# Patient Record
Sex: Female | Born: 1970 | Race: Black or African American | Hispanic: No | Marital: Single | Smoking: Never smoker
Health system: Southern US, Community
[De-identification: ages and names within clinical notes are randomized; demographics above are authoritative.]

## PROBLEM LIST (undated history)

## (undated) DIAGNOSIS — I10 Essential (primary) hypertension: Secondary | ICD-10-CM

## (undated) DIAGNOSIS — E119 Type 2 diabetes mellitus without complications: Secondary | ICD-10-CM

## (undated) DIAGNOSIS — D649 Anemia, unspecified: Secondary | ICD-10-CM

## (undated) DIAGNOSIS — R140 Abdominal distension (gaseous): Secondary | ICD-10-CM

## (undated) DIAGNOSIS — J45909 Unspecified asthma, uncomplicated: Secondary | ICD-10-CM

## (undated) DIAGNOSIS — L680 Hirsutism: Secondary | ICD-10-CM

## (undated) DIAGNOSIS — Z8616 Personal history of COVID-19: Secondary | ICD-10-CM

## (undated) DIAGNOSIS — E785 Hyperlipidemia, unspecified: Secondary | ICD-10-CM

## (undated) DIAGNOSIS — K219 Gastro-esophageal reflux disease without esophagitis: Secondary | ICD-10-CM

## (undated) DIAGNOSIS — E282 Polycystic ovarian syndrome: Secondary | ICD-10-CM

## (undated) HISTORY — DX: Hirsutism: L68.0

## (undated) HISTORY — DX: Hyperlipidemia, unspecified: E78.5

## (undated) HISTORY — DX: Morbid (severe) obesity due to excess calories: E66.01

## (undated) HISTORY — DX: Abdominal distension (gaseous): R14.0

## (undated) HISTORY — DX: Polycystic ovarian syndrome: E28.2

## (undated) HISTORY — DX: Essential (primary) hypertension: I10

---

## 1999-08-11 ENCOUNTER — Other Ambulatory Visit: Admission: RE | Admit: 1999-08-11 | Discharge: 1999-08-11 | Payer: Self-pay | Admitting: Obstetrics & Gynecology

## 2000-10-17 ENCOUNTER — Other Ambulatory Visit: Admission: RE | Admit: 2000-10-17 | Discharge: 2000-10-17 | Payer: Self-pay | Admitting: Obstetrics and Gynecology

## 2002-05-12 ENCOUNTER — Other Ambulatory Visit: Admission: RE | Admit: 2002-05-12 | Discharge: 2002-05-12 | Payer: Self-pay | Admitting: Obstetrics and Gynecology

## 2004-02-07 ENCOUNTER — Other Ambulatory Visit: Admission: RE | Admit: 2004-02-07 | Discharge: 2004-02-07 | Payer: Self-pay | Admitting: Obstetrics and Gynecology

## 2004-04-27 ENCOUNTER — Ambulatory Visit: Payer: Self-pay | Admitting: Internal Medicine

## 2004-05-05 ENCOUNTER — Ambulatory Visit: Payer: Self-pay | Admitting: Family Medicine

## 2004-05-18 ENCOUNTER — Ambulatory Visit: Payer: Self-pay | Admitting: Family Medicine

## 2004-05-26 ENCOUNTER — Ambulatory Visit: Payer: Self-pay | Admitting: Family Medicine

## 2004-06-02 ENCOUNTER — Ambulatory Visit: Payer: Self-pay | Admitting: Family Medicine

## 2004-06-09 ENCOUNTER — Ambulatory Visit: Payer: Self-pay | Admitting: Family Medicine

## 2004-06-16 ENCOUNTER — Ambulatory Visit: Payer: Self-pay | Admitting: Family Medicine

## 2004-06-23 ENCOUNTER — Ambulatory Visit: Payer: Self-pay | Admitting: Family Medicine

## 2004-07-06 ENCOUNTER — Ambulatory Visit: Payer: Self-pay | Admitting: Family Medicine

## 2004-08-11 ENCOUNTER — Ambulatory Visit: Payer: Self-pay | Admitting: Family Medicine

## 2004-09-06 ENCOUNTER — Ambulatory Visit: Payer: Self-pay | Admitting: Family Medicine

## 2004-10-06 ENCOUNTER — Ambulatory Visit: Payer: Self-pay | Admitting: Internal Medicine

## 2005-03-09 ENCOUNTER — Ambulatory Visit: Payer: Self-pay | Admitting: Family Medicine

## 2005-03-09 ENCOUNTER — Encounter: Payer: Self-pay | Admitting: Family Medicine

## 2005-03-09 ENCOUNTER — Other Ambulatory Visit: Admission: RE | Admit: 2005-03-09 | Discharge: 2005-03-09 | Payer: Self-pay | Admitting: Family Medicine

## 2005-03-23 ENCOUNTER — Ambulatory Visit: Payer: Self-pay | Admitting: Family Medicine

## 2005-04-06 ENCOUNTER — Ambulatory Visit: Payer: Self-pay | Admitting: Family Medicine

## 2005-04-20 ENCOUNTER — Ambulatory Visit: Payer: Self-pay | Admitting: Family Medicine

## 2005-05-18 ENCOUNTER — Ambulatory Visit: Payer: Self-pay | Admitting: Family Medicine

## 2005-06-25 ENCOUNTER — Ambulatory Visit: Payer: Self-pay | Admitting: Family Medicine

## 2005-12-03 ENCOUNTER — Inpatient Hospital Stay (HOSPITAL_COMMUNITY): Admission: AD | Admit: 2005-12-03 | Discharge: 2005-12-03 | Payer: Self-pay | Admitting: Obstetrics and Gynecology

## 2007-07-03 ENCOUNTER — Encounter: Payer: Self-pay | Admitting: Family Medicine

## 2007-07-18 LAB — CONVERTED CEMR LAB: Pap Smear: ABNORMAL

## 2007-09-22 ENCOUNTER — Encounter: Payer: Self-pay | Admitting: Family Medicine

## 2008-06-25 ENCOUNTER — Encounter (INDEPENDENT_AMBULATORY_CARE_PROVIDER_SITE_OTHER): Payer: Self-pay | Admitting: *Deleted

## 2008-06-25 ENCOUNTER — Ambulatory Visit: Payer: Self-pay | Admitting: Family Medicine

## 2008-06-25 DIAGNOSIS — R142 Eructation: Secondary | ICD-10-CM

## 2008-06-25 DIAGNOSIS — L680 Hirsutism: Secondary | ICD-10-CM | POA: Insufficient documentation

## 2008-06-25 DIAGNOSIS — R141 Gas pain: Secondary | ICD-10-CM | POA: Insufficient documentation

## 2008-06-25 DIAGNOSIS — R87619 Unspecified abnormal cytological findings in specimens from cervix uteri: Secondary | ICD-10-CM | POA: Insufficient documentation

## 2008-06-25 DIAGNOSIS — R143 Flatulence: Secondary | ICD-10-CM

## 2008-06-25 LAB — CONVERTED CEMR LAB
Bilirubin Urine: NEGATIVE
Glucose, Urine, Semiquant: NEGATIVE
Ketones, urine, test strip: NEGATIVE
Nitrite: NEGATIVE
Protein, U semiquant: NEGATIVE
Specific Gravity, Urine: <1.005
Urobilinogen, UA: 0.2
WBC Urine, dipstick: NEGATIVE
pH: 7.5

## 2008-06-30 ENCOUNTER — Encounter (INDEPENDENT_AMBULATORY_CARE_PROVIDER_SITE_OTHER): Payer: Self-pay | Admitting: *Deleted

## 2008-06-30 ENCOUNTER — Ambulatory Visit: Payer: Self-pay | Admitting: Family Medicine

## 2008-07-07 ENCOUNTER — Telehealth (INDEPENDENT_AMBULATORY_CARE_PROVIDER_SITE_OTHER): Payer: Self-pay | Admitting: *Deleted

## 2008-07-07 LAB — CONVERTED CEMR LAB
ALT: 46 units/L — ABNORMAL HIGH (ref 0–35)
AST: 39 units/L — ABNORMAL HIGH (ref 0–37)
Albumin: 4.4 g/dL (ref 3.5–5.2)
Alkaline Phosphatase: 68 units/L (ref 39–117)
BUN: 7 mg/dL (ref 6–23)
Basophils Absolute: 0 10*3/uL (ref 0.0–0.1)
Basophils Relative: 0.3 % (ref 0.0–3.0)
Bilirubin, Direct: 0.1 mg/dL (ref 0.0–0.3)
CO2: 30 meq/L (ref 19–32)
Calcium: 9.8 mg/dL (ref 8.4–10.5)
Chloride: 103 meq/L (ref 96–112)
Cholesterol: 232 mg/dL (ref 0–200)
Cortisol, Plasma: 12.5 ug/dL
Creatinine, Ser: 0.7 mg/dL (ref 0.4–1.2)
Direct LDL: 149.1 mg/dL
Eosinophils Absolute: 0.1 10*3/uL (ref 0.0–0.7)
Eosinophils Relative: 1.4 % (ref 0.0–5.0)
FSH: 4.8 milliintl units/mL
GFR calc Af Amer: 122 mL/min
GFR calc non Af Amer: 101 mL/min
Glucose, Bld: 114 mg/dL — ABNORMAL HIGH (ref 70–99)
HCT: 39 % (ref 36.0–46.0)
HDL: 52.1 mg/dL (ref >39.0)
Hemoglobin: 13.5 g/dL (ref 12.0–15.0)
LH: 6.2 milliintl units/mL
Lymphocytes Relative: 21.3 % (ref 12.0–46.0)
MCHC: 34.6 g/dL (ref 30.0–36.0)
MCV: 81.8 fL (ref 78.0–100.0)
Monocytes Absolute: 0.4 10*3/uL (ref 0.1–1.0)
Monocytes Relative: 5.7 % (ref 3.0–12.0)
Neutro Abs: 5.6 10*3/uL (ref 1.4–7.7)
Neutrophils Relative %: 71.3 % (ref 43.0–77.0)
Platelets: 327 10*3/uL (ref 150–400)
Potassium: 3.9 meq/L (ref 3.5–5.1)
RBC: 4.76 M/uL (ref 3.87–5.11)
RDW: 14.4 % (ref 11.5–14.6)
Sodium: 139 meq/L (ref 135–145)
TSH: 0.89 microintl units/mL (ref 0.35–5.50)
Total Bilirubin: 1.1 mg/dL (ref 0.3–1.2)
Total CHOL/HDL Ratio: 4.5
Total Protein: 9 g/dL — ABNORMAL HIGH (ref 6.0–8.3)
Triglycerides: 181 mg/dL — ABNORMAL HIGH (ref 0–149)
VLDL: 36 mg/dL (ref 0–40)
WBC: 7.8 10*3/uL (ref 4.5–10.5)
hCG, Beta Chain, Quant, S: <0.5 milliintl units/mL

## 2008-07-08 LAB — CONVERTED CEMR LAB: Insulin: 69 microintl units/mL — ABNORMAL HIGH (ref 3–28)

## 2008-07-09 ENCOUNTER — Encounter (INDEPENDENT_AMBULATORY_CARE_PROVIDER_SITE_OTHER): Payer: Self-pay | Admitting: *Deleted

## 2008-07-14 ENCOUNTER — Ambulatory Visit: Payer: Self-pay | Admitting: Family Medicine

## 2008-07-14 DIAGNOSIS — Z862 Personal history of diseases of the blood and blood-forming organs and certain disorders involving the immune mechanism: Secondary | ICD-10-CM | POA: Insufficient documentation

## 2008-07-14 DIAGNOSIS — E282 Polycystic ovarian syndrome: Secondary | ICD-10-CM | POA: Insufficient documentation

## 2008-07-14 DIAGNOSIS — I10 Essential (primary) hypertension: Secondary | ICD-10-CM | POA: Insufficient documentation

## 2008-07-14 DIAGNOSIS — E785 Hyperlipidemia, unspecified: Secondary | ICD-10-CM | POA: Insufficient documentation

## 2008-07-14 DIAGNOSIS — Z8639 Personal history of other endocrine, nutritional and metabolic disease: Secondary | ICD-10-CM | POA: Insufficient documentation

## 2008-08-04 ENCOUNTER — Telehealth (INDEPENDENT_AMBULATORY_CARE_PROVIDER_SITE_OTHER): Payer: Self-pay | Admitting: *Deleted

## 2008-08-04 ENCOUNTER — Ambulatory Visit: Payer: Self-pay | Admitting: Family Medicine

## 2008-08-09 LAB — CONVERTED CEMR LAB
HCV Ab: NEGATIVE
Hep A IgM: NEGATIVE
Hep B C IgM: NEGATIVE
Hepatitis B Surface Ag: NEGATIVE

## 2008-08-10 ENCOUNTER — Encounter (INDEPENDENT_AMBULATORY_CARE_PROVIDER_SITE_OTHER): Payer: Self-pay | Admitting: *Deleted

## 2008-08-17 LAB — CONVERTED CEMR LAB
ALT: 53 units/L — ABNORMAL HIGH (ref 0–35)
AST: 55 units/L — ABNORMAL HIGH (ref 0–37)
Albumin: 4.3 g/dL (ref 3.5–5.2)
Alkaline Phosphatase: 70 units/L (ref 39–117)
Bilirubin, Direct: 0.1 mg/dL (ref 0.0–0.3)
GGT: 40 units/L (ref 7–51)
Total Bilirubin: 1.6 mg/dL — ABNORMAL HIGH (ref 0.3–1.2)
Total Protein: 9 g/dL — ABNORMAL HIGH (ref 6.0–8.3)

## 2008-08-18 ENCOUNTER — Telehealth (INDEPENDENT_AMBULATORY_CARE_PROVIDER_SITE_OTHER): Payer: Self-pay | Admitting: *Deleted

## 2008-08-30 ENCOUNTER — Telehealth (INDEPENDENT_AMBULATORY_CARE_PROVIDER_SITE_OTHER): Payer: Self-pay | Admitting: *Deleted

## 2008-09-02 ENCOUNTER — Ambulatory Visit: Payer: Self-pay | Admitting: Family Medicine

## 2008-09-03 ENCOUNTER — Encounter (INDEPENDENT_AMBULATORY_CARE_PROVIDER_SITE_OTHER): Payer: Self-pay | Admitting: *Deleted

## 2008-09-03 LAB — CONVERTED CEMR LAB
ALT: 34 units/L (ref 0–35)
AST: 31 units/L (ref 0–37)
Albumin: 4 g/dL (ref 3.5–5.2)
Alkaline Phosphatase: 63 units/L (ref 39–117)
Bilirubin, Direct: 0 mg/dL (ref 0.0–0.3)
Total Bilirubin: 1.1 mg/dL (ref 0.3–1.2)
Total Protein: 8.2 g/dL (ref 6.0–8.3)

## 2008-09-28 LAB — HM MAMMOGRAPHY

## 2008-11-02 ENCOUNTER — Encounter: Payer: Self-pay | Admitting: Family Medicine

## 2009-05-02 ENCOUNTER — Encounter: Payer: Self-pay | Admitting: Family Medicine

## 2010-02-27 LAB — CONVERTED CEMR LAB: Pap Smear: NORMAL

## 2010-02-27 LAB — HM PAP SMEAR: HM Pap smear: NORMAL

## 2010-04-04 ENCOUNTER — Encounter: Payer: Self-pay | Admitting: Family Medicine

## 2010-04-04 ENCOUNTER — Ambulatory Visit: Payer: Self-pay | Admitting: Family Medicine

## 2010-04-05 ENCOUNTER — Encounter: Payer: Self-pay | Admitting: Family Medicine

## 2010-04-10 LAB — CONVERTED CEMR LAB
ALT: 29 units/L (ref 0–35)
AST: 30 units/L (ref 0–37)
Albumin: 4.4 g/dL (ref 3.5–5.2)
Alkaline Phosphatase: 58 units/L (ref 39–117)
BUN: 9 mg/dL (ref 6–23)
Basophils Absolute: 0 10*3/uL (ref 0.0–0.1)
Basophils Relative: 0.3 % (ref 0.0–3.0)
Bilirubin, Direct: 0.1 mg/dL (ref 0.0–0.3)
CO2: 32 meq/L (ref 19–32)
Calcium: 10.2 mg/dL (ref 8.4–10.5)
Chloride: 101 meq/L (ref 96–112)
Cholesterol: 237 mg/dL — ABNORMAL HIGH (ref 0–200)
Creatinine, Ser: 0.7 mg/dL (ref 0.4–1.2)
Direct LDL: 159.1 mg/dL
Eosinophils Absolute: 0.3 10*3/uL (ref 0.0–0.7)
Eosinophils Relative: 3.4 % (ref 0.0–5.0)
GFR calc non Af Amer: 116.59 mL/min (ref >60)
Glucose, Bld: 103 mg/dL — ABNORMAL HIGH (ref 70–99)
HCT: 39 % (ref 36.0–46.0)
HDL: 55.5 mg/dL (ref >39.00)
Hemoglobin: 12.7 g/dL (ref 12.0–15.0)
Lymphocytes Relative: 28 % (ref 12.0–46.0)
Lymphs Abs: 2.3 10*3/uL (ref 0.7–4.0)
MCHC: 32.7 g/dL (ref 30.0–36.0)
MCV: 82.1 fL (ref 78.0–100.0)
Monocytes Absolute: 0.5 10*3/uL (ref 0.1–1.0)
Monocytes Relative: 6.1 % (ref 3.0–12.0)
Neutro Abs: 5.1 10*3/uL (ref 1.4–7.7)
Neutrophils Relative %: 62.2 % (ref 43.0–77.0)
Platelets: 358 10*3/uL (ref 150.0–400.0)
Potassium: 4.5 meq/L (ref 3.5–5.1)
RBC: 4.75 M/uL (ref 3.87–5.11)
RDW: 15 % — ABNORMAL HIGH (ref 11.5–14.6)
Sodium: 140 meq/L (ref 135–145)
TSH: 1.27 microintl units/mL (ref 0.35–5.50)
Total Bilirubin: 0.7 mg/dL (ref 0.3–1.2)
Total CHOL/HDL Ratio: 4
Total Protein: 8.3 g/dL (ref 6.0–8.3)
Triglycerides: 178 mg/dL — ABNORMAL HIGH (ref 0.0–149.0)
VLDL: 35.6 mg/dL (ref 0.0–40.0)
WBC: 8.1 10*3/uL (ref 4.5–10.5)

## 2010-05-31 HISTORY — PX: OTHER SURGICAL HISTORY: SHX169

## 2010-06-26 ENCOUNTER — Ambulatory Visit
Admission: RE | Admit: 2010-06-26 | Discharge: 2010-06-26 | Payer: Self-pay | Source: Home / Self Care | Attending: Family Medicine | Admitting: Family Medicine

## 2010-07-03 ENCOUNTER — Ambulatory Visit: Admit: 2010-07-03 | Payer: Self-pay | Admitting: Family Medicine

## 2010-07-09 ENCOUNTER — Encounter: Payer: Self-pay | Admitting: Obstetrics and Gynecology

## 2010-07-20 NOTE — Letter (Signed)
Summary: Results Follow-up Letter  [place] at [facility]  [address] [place], [place] [address]   Phone: [phone]  Fax: [phone]    08/10/2008        Michele Tran [address] [place], [place]  [address]  Dear Ms. Sanagustin,   The following are the results of your recent test(s):  Test     Result     Pap Smear    Normal_______  Not Normal_____       Comments: _________________________________________________________ Cholesterol LDL(Bad cholesterol):          Your goal is less than:         HDL (Good cholesterol):        Your goal is more than: _________________________________________________________ Other Tests:   _________________________________________________________  Please call for an appointment Or _Please see attached labwork.________________________________________________________ _________________________________________________________ _________________________________________________________  Sincerely,  Ardyth Man [place] at [facility]

## 2010-07-20 NOTE — Progress Notes (Signed)
Summary: clarification on Lovaza rx  Phone Note From Pharmacy   Caller: [facility] [address]/[place] [phone] Summary of Call: questions re; lovaza written for qty 30, that would only last pt 7 1/2 days did you  mean 120? for 30 day supply rx was sent electronically advise please .Kandice Hams  August 04, 2008 9:49 AM  Initial call taken by: Kandice Hams,  August 04, 2008 9:49 AM      Prescriptions: LOVAZA 1 GM CAPS (OMEGA-3-ACID ETHYL ESTERS) take 2  tabs by mouth two times a day  #120 x 2   Entered by:   Ardyth Man   Authorized by:   Loreen Freud DO   Signed by:   Ardyth Man on 08/04/2008   Method used:   Electronically to        [facility]. [phone]* (retail)       [address]       [place], [place]  [address]       Ph: [phone]       Fax: [phone]   RxID:   [id number]

## 2010-07-20 NOTE — Medication Information (Signed)
Summary: Possible Noncompliance with Lisinopril/[employer]  Possible Noncompliance with Lisinopril/[employer]   Imported By: Lanelle Bal 11/04/2008 12:25:11  _____________________________________________________________________  External Attachment:    Type:   Image     Comment:   External Document

## 2010-07-20 NOTE — Progress Notes (Signed)
Summary: [facility] 08/30/08  Phone Note Call from Patient Call back at Work Phone [phone]   Caller: Patient Summary of Call: patient wants to know if she can take liver dtx cleanse with the meds she is taking   Initial call taken by: Okey Regal [place],  August 30, 2008 9:52 AM  Follow-up for Phone Call        Does she mean colon cleanse?  That's fine---take probiotic to replace good bacteria. Follow-up by: Loreen Freud DO,  August 30, 2008 12:31 PM  Additional Follow-up for Phone Call Additional follow up Details #1::        Left message for patient to call office Ardyth Man  August 30, 2008 2:31 PM  Additional Follow-up by: Ardyth Man,  August 30, 2008 2:31 PM    Additional Follow-up for Phone Call Additional follow up Details #2::    Patient aware Ardyth Man  August 31, 2008 1:02 PM  Follow-up by: Ardyth Man,  August 31, 2008 1:02 PM

## 2010-07-20 NOTE — Assessment & Plan Note (Signed)
Summary: bp ched, & lab liver function.cbs   Vital Signs:  Patient Profile:   40 Years Old Female Height:     66.75 inches Weight:      274.19 pounds Temp:     98.3 degrees F oral Pulse rate:   74 / minute Resp:     16 per minute BP sitting:   128 / 74  (right arm)  Pt. in pain?   no  Vitals Entered By: Ardyth Man (August 04, 2008 9:17 AM)                  PCP:  Laury Axon  Chief Complaint:  BP check and liver function test.  History of Present Illness:  Hypertension Follow-Up      This is a 40 year old woman who presents for Hypertension follow-up.  The patient denies lightheadedness, urinary frequency, headaches, edema, impotence, rash, and fatigue.  The patient denies the following associated symptoms: chest pain, chest pressure, exercise intolerance, dyspnea, palpitations, syncope, leg edema, and pedal edema.  Compliance with medications (by patient report) has been near 100%.  The patient reports that dietary compliance has been good.  The patient reports exercising daily.  Adjunctive measures currently used by the patient include salt restriction.      Current Allergies: ! CODEINE  Past Medical History:    Reviewed history from 07/14/2008 and no changes required:       Current Problems:        POLYCYSTIC OVARIES (ICD-256.4)       HYPERTENSION (ICD-401.9)       HYPERLIPIDEMIA (ICD-272.4)       HIRSUTISM (ICD-704.1)       PAP SMEAR, ABNORMAL (ICD-795.00)       ABDOMINAL BLOATING (ICD-787.3)       PREVENTIVE HEALTH CARE (ICD-V70.0)       FAMILY HISTORY DIABETES 1ST DEGREE RELATIVE (ICD-V18.0)                     Hyperlipidemia       Hypertension  Past Surgical History:    Reviewed history from 06/25/2008 and no changes required:       Denies surgical history      Physical Exam  General:     alert and overweight-appearing.   Lungs:     Normal respiratory effort, chest expands symmetrically. Lungs are clear to auscultation, no crackles or  wheezes. Heart:     normal rate, regular rhythm, and no murmur.   Extremities:     trace left pedal edema and trace right pedal edema.   Skin:     Intact without suspicious lesions or rashes Psych:     Cognition and judgment appear intact. Alert and cooperative with normal attention span and concentration. No apparent delusions, illusions, hallucinations    Impression & Recommendations:  Problem # 1:  HYPERTENSION (ICD-401.9)  Her updated medication list for this problem includes:    Zestoretic 10-12.5 Mg Tabs (Lisinopril-hydrochlorothiazide) .Marland Kitchen... 1 by mouth once daily  BP today: 128/74 Prior BP: 140/100 (07/14/2008)  Labs Reviewed: Creat: 0.7 (06/25/2008) Chol: 232 (06/25/2008)   HDL: 52.1 (06/25/2008)   LDL: 149.1 (06/25/2008)   TG: 181 (06/25/2008)   Problem # 2:  LIVER FUNCTION TESTS, ABNORMAL, HX OF (ICD-V12.2)  Orders: Venipuncture ([address]) TLB-Hepatic/Liver Function Pnl (80076-HEPATIC) T-Hepatitis Acute Panel ([address]8119-14782) T- * Misc. Laboratory test [phone])   Complete Medication List: 1)  Zestoretic 10-12.5 Mg Tabs (Lisinopril-hydrochlorothiazide) .Marland Kitchen.. 1 by mouth once daily 2)  Lovaza 1  Gm Caps (Omega-3-acid ethyl esters) .... Take 2  tabs by mouth two times a day 3)  Prometrium 100 Mg Caps (Progesterone micronized) .... Take one capsule daily for 10 days monthly  Appended Document: bp ched, & lab liver function.cbs

## 2010-07-20 NOTE — Assessment & Plan Note (Signed)
Summary: RETURN VISIT 3 WEEKS.CBS   Vital Signs:  Patient Profile:   40 Years Old Female Height:     66.75 inches Weight:      278.2 pounds BMI:     44.06 Temp:     98.5 degrees F oral Pulse rate:   76 / minute BP sitting:   140 / 100  (left arm)  Pt. in pain?   no  Vitals Entered By: Jeremy Johann CMA (July 14, 2008 9:33 AM)                  PCP:  Laury Axon  Chief Complaint:  bp check and labs.  History of Present Illness:  Hypertension Follow-Up      This is a 40 year old woman who presents for Hypertension follow-up.  The patient denies lightheadedness, urinary frequency, headaches, edema, impotence, rash, and fatigue.  The patient denies the following associated symptoms: chest pain, chest pressure, exercise intolerance, dyspnea, palpitations, syncope, leg edema, and pedal edema.  Compliance with medications (by patient report) has been near 100%.  The patient reports that dietary compliance has been good.  Adjunctive measures currently used by the patient include salt restriction.      Current Allergies (reviewed today): ! CODEINE  Past Medical History:    Current Problems:     POLYCYSTIC OVARIES (ICD-256.4)    HYPERTENSION (ICD-401.9)    HYPERLIPIDEMIA (ICD-272.4)    HIRSUTISM (ICD-704.1)    PAP SMEAR, ABNORMAL (ICD-795.00)    ABDOMINAL BLOATING (ICD-787.3)    PREVENTIVE HEALTH CARE (ICD-V70.0)    FAMILY HISTORY DIABETES 1ST DEGREE RELATIVE (ICD-V18.0)            Hyperlipidemia    Hypertension   Family History:    Reviewed history from 06/25/2008 and no changes required:       Family History of Arthritis       Family History Diabetes 1st degree relative       Family History High cholesterol       Family History Hypertension       Paunt--breast and lung ca  Social History:    Reviewed history from 06/25/2008 and no changes required:       Occupation: [employer]       Single       Never Smoked       Alcohol use-no       Drug use-no  Regular exercise-yes   Risk Factors: Tobacco use:  never Drug use:  no Caffeine use:  0 drinks per day Alcohol use:  no Exercise:  yes    Times per week:  5    Type:  walking Seatbelt use:  100 %  Family History Risk Factors:    Family History of MI in females < 88 years old:  no    Family History of MI in males < 34 years old:  no  PAP Smear History:    Date of Last PAP Smear:  07/18/2007   Review of Systems      See HPI   Physical Exam  General:     alert and overweight-appearing.   Lungs:     Normal respiratory effort, chest expands symmetrically. Lungs are clear to auscultation, no crackles or wheezes. Heart:     normal rate, regular rhythm, and no murmur.   Extremities:     No clubbing, cyanosis, edema, or deformity noted with normal full range of motion of all joints.      Impression & Recommendations:  Problem # 1:  HYPERTENSION (ICD-401.9) rto 2-3 weeks  Her updated medication list for this problem includes:    Zestoretic 10-12.5 Mg Tabs (Lisinopril-hydrochlorothiazide) .Marland Kitchen... 1 by mouth once daily  BP today: 140/100 Prior BP: 130/90 (06/25/2008)  Labs Reviewed: Creat: 0.7 (06/25/2008) Chol: 232 (06/25/2008)   HDL: 52.1 (06/25/2008)   LDL: 149.1 (06/25/2008)   TG: 181 (06/25/2008)   Problem # 2:  HYPERLIPIDEMIA (ICD-272.4)  Her updated medication list for this problem includes:    Lovaza 1 Gm Caps (Omega-3-acid ethyl esters) .Marland Kitchen... Take 2  tabs by mouth two times a day  Labs Reviewed: Chol: 232 (06/25/2008)   HDL: 52.1 (06/25/2008)   LDL: 149.1 (06/25/2008)   TG: 181 (06/25/2008) SGOT: 39 (06/25/2008)   SGPT: 46 (06/25/2008)   Problem # 3:  LIVER FUNCTION TESTS, ABNORMAL, HX OF (ICD-V12.2) recheck 2-3 weeks discussed labs with pt  Complete Medication List: 1)  Zestoretic 10-12.5 Mg Tabs (Lisinopril-hydrochlorothiazide) .Marland Kitchen.. 1 by mouth once daily 2)  Lovaza 1 Gm Caps (Omega-3-acid ethyl esters) .... Take 2  tabs by mouth two times a  day    Prescriptions: ZESTORETIC 10-12.5 MG TABS (LISINOPRIL-HYDROCHLOROTHIAZIDE) 1 by mouth once daily  #30 x 1   Entered and Authorized by:   Loreen Freud DO   Signed by:   Loreen Freud DO on 07/14/2008   Method used[facility]   [facility]       [address]       [place], [place]  [id number]       Ph: [phone] or [phone]       Fax: [phone]   RxID:   [id number] LOVAZA 1 GM CAPS (OMEGA-3-ACID ETHYL ESTERS) take 2  tabs by mouth two times a day  #30 x 2   Entered by:   Jeremy Johann CMA   Authorized by:   Loreen Freud DO   Signed by:   Jeremy Johann CMA on 07/14/2008   Method used:   [facility]       [address]       [place], [place]  [id number]       Ph: [phone] or [phone]       Fax: [phone]   RxID:   [id number] LOVAZA 1 GM CAPS (OMEGA-3-ACID ETHYL ESTERS) take 2  tabs by mouth two times a day  #30 x 2   Entered by:   Jeremy Johann CMA   Authorized by:   Loreen Freud DO   Signed by:   Jeremy Johann CMA on 07/14/2008   Method used:   Historical   RxID:   [id number]

## 2010-07-20 NOTE — Letter (Signed)
Summary: Results Follow-up Letter  [place] at [facility]  [address] [place], [place] [address]   Phone: [phone]  Fax: [phone]    06/30/2008         Michele Tran [address] [place], [place]  [address]  Dear Ms. Derner,   The following are the results of your recent test(s):  Test     Result     Pap Smear    Normal_______  Not Normal_____       Comments: _________________________________________________________ Cholesterol LDL(Bad cholesterol):          Your goal is less than:         HDL (Good cholesterol):        Your goal is more than: _________________________________________________________ Other Tests:   _________________________________________________________  Please call for an appointment Or Please see attached urine culture.________________________________________________________ _________________________________________________________ _________________________________________________________  Sincerely,  Felecia Deloach CMA [place] [facility]

## 2010-07-20 NOTE — Progress Notes (Signed)
Summary: lmtc 07-07-08, 07-12-08  Phone Note Outgoing Call   Details for Reason: lab results Summary of Call: left message to call  office.Felecia Deloach CMA  July 07, 2008 1:16 PM  Ideally your LDL (bad cholesterol) should be <_100___, your HDL (good cholesterol) should be >_50__ and your triglycerides should be< 150.  Diet and exercise will increase HDL and decrease the LDL and triglycerides. Read Dr. Danice Goltz book--Eat Drink and Be Healthy.    Start Lovaza 1g  2 by mouth two times a day,  2 refills.  We will recheck labs in __3_ months.  hep, lipid 272.4  Liver function is elevated---any ETOH, tylenol or other otc meds?  recheck 2 weeks--   Follow-up for Phone Call        BEFORE  4PM--[phone]--Returning Felicia call from Fri. Follow-up by: Freddy Jaksch,  July 12, 2008 10:04 AM  Additional Follow-up for Phone Call Additional follow up Details #1::        left message to call  office.Felecia Deloach CMA  July 12, 2008 4:28 PM    Additional Follow-up for Phone Call Additional follow up Details #2::    pt coming in today will notify her at appt.Felecia Deloach CMA  July 14, 2008 9:21 AM  New/Updated Medications: LOVAZA 1 GM CAPS (OMEGA-3-ACID ETHYL ESTERS) take 2  tabs by mouth two times a day

## 2010-07-20 NOTE — Letter (Signed)
Summary: Results Follow-up Letter  [place] at [facility]  [address] [place], [place] [address]   Phone: [phone]  Fax: [phone]    07/09/2008        Michele Tran [address] [place], [place]  [address]  Dear Michele Tran,   The following are the results of your recent test(s):  Test     Result     Pap Smear    Normal_______  Not Normal_____       Comments: _________________________________________________________ Cholesterol LDL(Bad cholesterol):          Your goal is less than:         HDL (Good cholesterol):        Your goal is more than: _________________________________________________________ Other Tests:   _________________________________________________________  Please call for an appointment Or Please see attached labs._________________________________________________________ _________________________________________________________ _________________________________________________________  Sincerely,  Felecia Deloach CMA [place] at [facility]

## 2010-07-20 NOTE — Progress Notes (Signed)
Summary: [facility] 08/18/08  Phone Note Outgoing Call Call back at Home Phone [phone]   Call placed by: Ardyth Man,  August 18, 2008 8:11 AM Call placed to: Patient Summary of Call: LFT increased------ no alcohol , tylenol or herbal meds.  Repeat 2 weeks----790.4  hep,--- + fatty liver-----diet / weight loss will help this  [facility] Ardyth Man  August 18, 2008 8:11 AM   Follow-up for Phone Call        Patient aware and lab scheduled for 2  weeks. Ardyth Man  August 19, 2008 2:13 PM  Follow-up by: Ardyth Man,  August 19, 2008 2:13 PM

## 2010-07-20 NOTE — Assessment & Plan Note (Signed)
Summary: CPX AND FASTING LABS--NO PAP///SPH   Vital Signs:  Patient profile:   40 year old female Height:      66.75 inches Weight:      277.0 pounds BMI:     43.87 Temp:     98.0 degrees F oral Pulse rate:   88 / minute Pulse rhythm:   regular BP sitting:   136 / 70  (right arm) Cuff size:   large  Vitals Entered By: Almeta Monas CMA Duncan Dull) (April 04, 2010 9:03 AM) CC: cpx/fasting, no pap, declined flu shot, wants to discuss weight lost   History of Present Illness: Pt here for cpe and labs.  She wants to discuss weight loss.  Pt is asking about phenteramine again.  Pt travels a lot with work.     Preventive Screening-Counseling & Management  Alcohol-Tobacco     Alcohol drinks/day: 0     Smoking Status: never  Caffeine-Diet-Exercise     Caffeine use/day: 2     Diet Comments: not health     Diet Counseling: to improve diet; diet is suboptimal     Does Patient Exercise: yes     Type of exercise: walking     Exercise (avg: min/session): 30-60     Times/week: 5     Exercise Counseling: to improve exercise regimen  Hep-HIV-STD-Contraception     Dental Visit-last 6 months yes     Dental Care Counseling: to seek dental care; no dental care within six months     SBE monthly: yes     SBE Education/Counseling: not indicated; SBE done regularly  Safety-Violence-Falls     Seat Belt Use: 100      Sexual History:  not sexually active.        Drug Use:  never.    Current Medications (verified): 1)  Zestoretic 10-12.5 Mg Tabs (Lisinopril-Hydrochlorothiazide) .Marland Kitchen.. 1 By Mouth Once Daily 2)  Lovaza 1 Gm Caps (Omega-3-Acid Ethyl Esters) .... Take 2  Tabs By Mouth Two Times A Day  Allergies (verified): 1)  ! Codeine  Family History: Reviewed history from 06/25/2008 and no changes required. Family History of Arthritis Family History Diabetes 1st degree relative Family History High cholesterol Family History Hypertension Paunt--breast and lung ca Family History of  Stroke F 1st degree relative 64 yo---mom Family History of CAD Female--p uncle in late 30s   Social History: Reviewed history from 06/25/2008 and no changes required. Occupation: [employer] Single Never Smoked Alcohol use-no Drug use-no Regular exercise-yes Caffeine use/day:  2 Dental Care w/in 6 mos.:  yes Sexual History:  not sexually active Drug Use:  never  Review of Systems      See HPI General:  Denies chills, fatigue, fever, loss of appetite, malaise, sleep disorder, sweats, weakness, and weight loss. Eyes:  Denies blurring, discharge, double vision, eye irritation, eye pain, halos, itching, light sensitivity, red eye, vision loss-1 eye, and vision loss-both eyes; optho-- q2y  . ENT:  Denies decreased hearing, difficulty swallowing, ear discharge, earache, hoarseness, nasal congestion, nosebleeds, postnasal drainage, ringing in ears, sinus pressure, and sore throat. CV:  Denies bluish discoloration of lips or nails, chest pain or discomfort, difficulty breathing at night, difficulty breathing while lying down, fainting, fatigue, leg cramps with exertion, lightheadness, near fainting, palpitations, shortness of breath with exertion, swelling of feet, swelling of hands, and weight gain. Resp:  Denies chest discomfort, chest pain with inspiration, cough, coughing up blood, excessive snoring, hypersomnolence, morning headaches, pleuritic, shortness of breath, sputum productive, and  wheezing. GI:  Denies abdominal pain, bloody stools, change in bowel habits, constipation, dark tarry stools, diarrhea, excessive appetite, gas, hemorrhoids, indigestion, loss of appetite, and nausea. GU:  Denies abnormal vaginal bleeding, decreased libido, discharge, dysuria, genital sores, hematuria, incontinence, nocturia, urinary frequency, and urinary hesitancy. MS:  Denies joint pain, joint redness, joint swelling, loss of strength, low back pain, mid back pain, muscle aches, muscle , cramps, muscle  weakness, stiffness, and thoracic pain. Derm:  Denies changes in color of skin, changes in nail beds, dryness, excessive perspiration, flushing, hair loss, insect bite(s), itching, lesion(s), poor wound healing, and rash. Neuro:  Denies brief paralysis, difficulty with concentration, disturbances in coordination, falling down, headaches, inability to speak, memory loss, numbness, poor balance, seizures, sensation of room spinning, tingling, tremors, visual disturbances, and weakness. Psych:  Denies alternate hallucination ( auditory/visual), anxiety, depression, easily angered, easily tearful, irritability, mental problems, panic attacks, sense of great danger, suicidal thoughts/plans, thoughts of violence, unusual visions or sounds, and thoughts /plans of harming others. Endo:  Denies cold intolerance, excessive hunger, excessive thirst, excessive urination, heat intolerance, polyuria, and weight change. Heme:  Denies abnormal bruising, bleeding, enlarge lymph nodes, fevers, pallor, and skin discoloration. Allergy:  Denies hives or rash, itching eyes, persistent infections, seasonal allergies, and sneezing.   Impression & Recommendations:  Problem # 1:  PREVENTIVE HEALTH CARE (ICD-V70.0) ghm utd  pap and mammo etc per gyn  Orders: Venipuncture (04540) TLB-Lipid Panel (80061-LIPID) TLB-BMP (Basic Metabolic Panel-BMET) (80048-METABOL) TLB-CBC Platelet - w/Differential (85025-CBCD) TLB-TSH (Thyroid Stimulating Hormone) (84443-TSH) TLB-Hepatic/Liver Function Pnl (80076-HEPATIC) EKG w/ Interpretation (93000)  Problem # 2:  POLYCYSTIC OVARIES (ICD-256.4) per gyn  Problem # 3:  HYPERTENSION (ICD-401.9)  Her updated medication list for this problem includes:    Zestoretic 10-12.5 Mg Tabs (Lisinopril-hydrochlorothiazide) .Marland Kitchen... 1 by mouth once daily  Orders: Venipuncture (98119) TLB-Lipid Panel (80061-LIPID) TLB-BMP (Basic Metabolic Panel-BMET) (80048-METABOL) TLB-CBC Platelet -  w/Differential (85025-CBCD) TLB-TSH (Thyroid Stimulating Hormone) (84443-TSH) TLB-Hepatic/Liver Function Pnl (80076-HEPATIC) EKG w/ Interpretation (93000)  BP today: 136/70 Prior BP: 128/74 (08/04/2008)  Labs Reviewed: K+: 3.9 (06/25/2008) Creat: : 0.7 (06/25/2008)   Chol: 232 (06/25/2008)   HDL: 52.1 (06/25/2008)   LDL: DEL (06/25/2008)   TG: 181 (06/25/2008)  Problem # 4:  HYPERLIPIDEMIA (ICD-272.4)  Her updated medication list for this problem includes:    Lovaza 1 Gm Caps (Omega-3-acid ethyl esters) .Marland Kitchen... Take 2  tabs by mouth two times a day  Orders: Venipuncture (14782) TLB-Lipid Panel (80061-LIPID) TLB-BMP (Basic Metabolic Panel-BMET) (80048-METABOL) TLB-CBC Platelet - w/Differential (85025-CBCD) TLB-TSH (Thyroid Stimulating Hormone) (84443-TSH) TLB-Hepatic/Liver Function Pnl (80076-HEPATIC) EKG w/ Interpretation (93000)  Labs Reviewed: SGOT: 31 (09/02/2008)   SGPT: 34 (09/02/2008)   HDL:52.1 (06/25/2008)  LDL:DEL (06/25/2008)  Chol:232 (06/25/2008)  Trig:181 (06/25/2008)  Problem # 5:  MORBID OBESITY (ICD-278.01)  d/w pt diet--- weight watchers, optifast or flat belly diet ---she will decide which one she would like to try adipex 37.5  1 by mouth once daily  d/w increasing exercise to 45-24min 4-5 days a week  Ht: 66.75 (04/04/2010)   Wt: 277.0 (04/04/2010)   BMI: 43.87 (04/04/2010)  Orders: EKG w/ Interpretation (93000)  Complete Medication List: 1)  Zestoretic 10-12.5 Mg Tabs (Lisinopril-hydrochlorothiazide) .Marland Kitchen.. 1 by mouth once daily 2)  Lovaza 1 Gm Caps (Omega-3-acid ethyl esters) .... Take 2  tabs by mouth two times a day 3)  Adipex-p 37.5 Mg Tabs (Phentermine hcl) .Marland Kitchen.. 1 by mouth qam  Other Orders: Tdap => 59yrs IM (95621) Admin 1st Vaccine (  40981)  Patient Instructions: 1)  Please schedule a follow-up appointment in 1 month.  Prescriptions: ZESTORETIC 10-12.5 MG TABS (LISINOPRIL-HYDROCHLOROTHIAZIDE) 1 by mouth once daily  #90 x 3   Entered and  Authorized by:   Loreen Freud DO   Signed by:   Loreen Freud DO on 04/04/2010   Method used:   Electronically to        [facility]. [phone]* (retail)       [address]       [place], [place]  [address]       Ph: [phone]       Fax: [phone]   RxID:   [id number] ADIPEX-P 37.5 MG TABS (PHENTERMINE HCL) 1 by mouth qam  #30 x 0   Entered and Authorized by:   Loreen Freud DO   Signed by:   Loreen Freud DO on 04/04/2010   Method used:   Print then Give to Patient   RxID:   [id number]    Orders Added: 1)  Tdap => 57yrs IM [90715] 2)  Admin 1st Vaccine [90471] 3)  Venipuncture [33295] 4)  TLB-Lipid Panel [80061-LIPID] 5)  TLB-BMP (Basic Metabolic Panel-BMET) [80048-METABOL] 6)  TLB-CBC Platelet - w/Differential [85025-CBCD] 7)  TLB-TSH (Thyroid Stimulating Hormone) [84443-TSH] 8)  TLB-Hepatic/Liver Function Pnl [80076-HEPATIC] 9)  Est. Patient 18-39 years [99395] 10)  EKG w/ Interpretation [93000]   Immunizations Administered:  Tetanus Vaccine:    Vaccine Type: Tdap    Site: right deltoid    Mfr: Merck    Dose: 0.5 ml    Route: IM    Given by: Almeta Monas CMA (AAMA)    Exp. Date: 03/08/2012    Lot #: [id number]    VIS given: 05/05/08 version given April 04, 2010.   Immunizations Administered:  Tetanus Vaccine:    Vaccine Type: Tdap    Site: right deltoid    Mfr: Merck    Dose: 0.5 ml    Route: IM    Given by: Almeta Monas CMA (AAMA)    Exp. Date: 03/08/2012    Lot #: [id number]    VIS given: 05/05/08 version given April 04, 2010.   Flu Vaccine Next Due:  Refused TD Result Date:  04/04/2010 TD Result:  given TD Next Due:  10 yr Last PAP:  Abnormal (07/18/2007 9:46:12 AM) PAP Result Date:  02/27/2010 PAP Result:  normal PAP Next Due:  1 yr      Mammogram  Procedure date:  09/28/2008  Findings:      No specific mammographic evidence of malignancy.--- [facility] ----repeat at  age 76    Appended Document: CPX AND FASTING LABS--NO PAP///SPH send for culture   Clinical Lists Changes  Orders: Added new Test order of TLB-Udip w/ Micro (81001-URINE) - Signed Added new Test order of T-Urine Culture (Spectrum Order) [id number]) - Signed Observations: Added new observation of COMMENTS: Culture sent./kb (04/04/2010 12:37) Added new observation of PH URINE: 6.0  (04/04/2010 12:37) Added new observation of SPEC GR URIN: 1.010  (04/04/2010 12:37) Added new observation of APPEARANCE U: Hazy  (04/04/2010 12:37) Added new observation of UA COLOR: yellow  (04/04/2010 12:37) Added new observation of WBC DIPSTK U: large  (04/04/2010 12:37) Added new observation of NITRITE URN: negative  (04/04/2010 12:37) Added new observation of UROBILINOGEN: 0.2  (04/04/2010 12:37) Added new observation of PROTEIN, URN: negative  (04/04/2010 12:37) Added new observation of BLOOD UR DIP: large  (04/04/2010 12:37)  Added new observation of KETONES URN: negative  (04/04/2010 12:37) Added new observation of BILIRUBIN UR: negative  (04/04/2010 12:37) Added new observation of GLUCOSE, URN: negative  (04/04/2010 12:37)      Laboratory Results   Urine Tests  Date/Time Received: Lucious Groves [facility]  April 04, 2010 12:37 PM  Date/Time Reported: Lucious Groves CMA  April 04, 2010 12:37 PM  Routine Urinalysis   Color: yellow Appearance: Hazy Glucose: negative   (Normal Range: Negative) Bilirubin: negative   (Normal Range: Negative) Ketone: negative   (Normal Range: Negative) Spec. Gravity: 1.010   (Normal Range: 1.003-1.035) Blood: large   (Normal Range: Negative) pH: 6.0   (Normal Range: 5.0-8.0) Protein: negative   (Normal Range: Negative) Urobilinogen: 0.2   (Normal Range: 0-1) Nitrite: negative   (Normal Range: Negative) Leukocyte Esterace: large   (Normal Range: Negative)    Comments: Culture sent./kb

## 2010-07-20 NOTE — Letter (Signed)
Summary: Primary Care Consult Scheduled Letter  [place] [facility] [place]  [address] [place], [place] [address]   Phone: [phone]  Fax: [phone]      06/25/2008 MRN: [id number]  [facility] [address] [place], [place]  [address]    Dear Ms. Klatt,      We have scheduled an appointment for you.  At the recommendation of Dr.LOWNE, we have scheduled you a consult with DR Vincente Poli ON TUES 07/27/2008 at 11:15. The office phone number is [phone].  If this appointment day and time is not convenient for you, please feel free to call the office of the doctor you are being referred to at the number listed above and reschedule the appointment.     It is important for you to keep your scheduled appointments. We are here to make sure you are given good patient care. If you have questions or you have made changes to your appointment, please notify us at  [phone].    Thank you,  Patient Care Coordinator [place] [facility]

## 2010-07-20 NOTE — Assessment & Plan Note (Signed)
Summary: cpx & LAB.CBS   Vital Signs:  Patient Profile:   40 Years Old Female Height:     66.75 inches Weight:      280.2 pounds BMI:     44.37 Temp:     98.0 degrees F oral Pulse rate:   72 / minute Resp:     16 per minute BP sitting:   130 / 90  (left arm)  Pt. in pain?   no  Vitals Entered By: Jeremy Johann CMA (June 25, 2008 9:03 AM)                  PCP:  Laury Axon  Chief Complaint:  CPE and labs.  History of Present Illness: Pt here for cpe and labs.  Pt c/o abd bloating and swelling in feet and legs and she is going to the bathroom a lot.  Pt had CT scan which was negative.  Pt has not had period since January of last year---she has had some spotting.  Pt states she feels like something is moving but preg. tests have been negative.       Current Allergies (reviewed today): ! CODEINE  Past Medical History:    Reviewed history and no changes required:       Unremarkable  Past Surgical History:    Denies surgical history   Family History:    Reviewed history and no changes required:       Family History of Arthritis       Family History Diabetes 1st degree relative       Family History High cholesterol       Family History Hypertension       Paunt--breast and lung ca  Social History:    Reviewed history and no changes required:       Occupation: [employer]       Single       Never Smoked       Alcohol use-no       Drug use-no       Regular exercise-yes   Risk Factors:  Tobacco use:  never Drug use:  no Caffeine use:  0 drinks per day Alcohol use:  no Exercise:  yes    Times per week:  5    Type:  walking Seatbelt use:  100 %  Family History Risk Factors:    Family History of MI in females < 54 years old:  no    Family History of MI in males < 92 years old:  no  PAP Smear History:     Date of Last PAP Smear:  07/18/2007    Results:  Abnormal    Review of Systems      See HPI  General      Denies chills, fatigue, fever,  loss of appetite, malaise, sleep disorder, sweats, weakness, and weight loss.  Eyes      Denies blurring, discharge, double vision, eye irritation, eye pain, halos, itching, light sensitivity, red eye, vision loss-1 eye, and vision loss-both eyes.      optho--no  ENT      Denies decreased hearing, difficulty swallowing, ear discharge, earache, hoarseness, nasal congestion, nosebleeds, postnasal drainage, ringing in ears, sinus pressure, and sore throat.      dentist annually  CV      Denies bluish discoloration of lips or nails, chest pain or discomfort, difficulty breathing at night, difficulty breathing while lying down, fainting, fatigue, leg cramps with exertion, lightheadness, near fainting, palpitations,  shortness of breath with exertion, swelling of feet, swelling of hands, and weight gain.  Resp      Denies chest discomfort, chest pain with inspiration, cough, coughing up blood, excessive snoring, hypersomnolence, morning headaches, pleuritic, shortness of breath, sputum productive, and wheezing.  GI      Denies abdominal pain, bloody stools, change in bowel habits, constipation, dark tarry stools, diarrhea, excessive appetite, gas, hemorrhoids, indigestion, loss of appetite, nausea, vomiting, vomiting blood, and yellowish skin color.  GU      Denies abnormal vaginal bleeding, decreased libido, discharge, dysuria, genital sores, hematuria, incontinence, nocturia, urinary frequency, and urinary hesitancy.  MS      Denies joint pain, joint redness, joint swelling, loss of strength, low back pain, mid back pain, muscle aches, muscle , cramps, muscle weakness, stiffness, and thoracic pain.  Derm      Denies changes in color of skin, changes in nail beds, dryness, excessive perspiration, flushing, hair loss, insect bite(s), itching, lesion(s), poor wound healing, and rash.  Neuro      Denies brief paralysis, difficulty with concentration, disturbances in coordination, falling down,  headaches, inability to speak, memory loss, numbness, poor balance, seizures, sensation of room spinning, tingling, tremors, visual disturbances, and weakness.  Endo      Denies cold intolerance, excessive hunger, excessive thirst, excessive urination, heat intolerance, polyuria, and weight change.  Heme      Denies abnormal bruising, bleeding, enlarge lymph nodes, fevers, pallor, and skin discoloration.  Allergy      Denies hives or rash, itching eyes, persistent infections, seasonal allergies, and sneezing.   Physical Exam  General:     alert, well-developed, well-hydrated, and overweight-appearing.  alert, well-developed, well-hydrated, and overweight-appearing.   Head:     Normocephalic and atraumatic. Eyes:     vision grossly intact, pupils equal, pupils round, pupils reactive to light, and no injection.  vision grossly intact, pupils equal, pupils round, pupils reactive to light, and no injection.   Ears:     Normal auditory acuity. Nose:     No deformity, discharge,  or lesions. Mouth:     No deformity or lesions, dentition normal. Neck:     Supple; no masses or thyromegaly. Chest Wall:     Symmetrical;  no deformities or tenderness. Breasts:     gyn Lungs:     Clear throughout to auscultation. Heart:     Regular rate and rhythm; no murmurs, rubs,  or bruits. Abdomen:     normal bowel sounds and obese.  normal bowel sounds and obese.   Genitalia:     gyn Msk:     joint tenderness, joint redness, joint warmth, decreased ROM, rhematoid changes, and arthritic changes.  joint tenderness, joint redness, joint warmth, decreased ROM, rhematoid changes, and arthritic changes.   Pulses:     Normal pulses noted. Extremities:     No clubbing, cyanosis, edema or deformities noted. Neurologic:     Alert and  oriented x4;  grossly normal neurologically. Skin:     Intact without significant lesions or rashes. Cervical Nodes:     No significant cervical adenopathy. Psych:      Alert and cooperative. Normal mood and affect.    Impression & Recommendations:  Problem # 1:  PREVENTIVE HEALTH CARE (ICD-V70.0)  Orders: Venipuncture (09811) TLB-Lipid Panel (80061-LIPID) TLB-BMP (Basic Metabolic Panel-BMET) (80048-METABOL) TLB-CBC Platelet - w/Differential (85025-CBCD) TLB-Hepatic/Liver Function Pnl (80076-HEPATIC) TLB-TSH (Thyroid Stimulating Hormone) (84443-TSH) TLB-Cortisol (82533-CORT) TLB-Preg Serum Quant (B-hCG) (84702-HCG-QN) TLB-Luteinizing Hormone (LH) (83002-LH) TLB-FSH (  Follicle Stimulating Hormone) (83001-FSH) T- * Misc. Laboratory test [phone])   Problem # 2:  ABDOMINAL BLOATING (ICD-787.3)  Orders: Venipuncture (98119) TLB-Lipid Panel (80061-LIPID) TLB-BMP (Basic Metabolic Panel-BMET) (80048-METABOL) TLB-CBC Platelet - w/Differential (85025-CBCD) TLB-Hepatic/Liver Function Pnl (80076-HEPATIC) TLB-TSH (Thyroid Stimulating Hormone) (84443-TSH) TLB-Cortisol (82533-CORT) TLB-Preg Serum Quant (B-hCG) (84702-HCG-QN) TLB-Luteinizing Hormone (LH) (83002-LH) TLB-FSH (Follicle Stimulating Hormone) (83001-FSH) T- * Misc. Laboratory test [phone]) T- * Misc. Laboratory test [phone]) Gynecologic Referral (Gyn)   Problem # 3:  PAP SMEAR, ABNORMAL (ICD-795.00)  Orders: Venipuncture (30865) TLB-Lipid Panel (80061-LIPID) TLB-BMP (Basic Metabolic Panel-BMET) (80048-METABOL) TLB-CBC Platelet - w/Differential (85025-CBCD) TLB-Hepatic/Liver Function Pnl (80076-HEPATIC) TLB-TSH (Thyroid Stimulating Hormone) (84443-TSH) TLB-Cortisol (82533-CORT) TLB-Preg Serum Quant (B-hCG) (84702-HCG-QN) TLB-Luteinizing Hormone (LH) (83002-LH) TLB-FSH (Follicle Stimulating Hormone) (83001-FSH) T- * Misc. Laboratory test [phone]) T- * Misc. Laboratory test [id number]) Gynecologic Referral (Gyn)   Complete Medication List: 1)  Hydrochlorothiazide 25 Mg Tabs (Hydrochlorothiazide) .Marland Kitchen.. 1 by mouth once daily  Other Orders: Specimen Handling (84132) T-Culture, Urine  ([id number]) UA Dipstick w/o Micro (manual) (81002)    Prescriptions: HYDROCHLOROTHIAZIDE 25 MG TABS (HYDROCHLOROTHIAZIDE) 1 by mouth once daily  #30 x 2   Entered and Authorized by:   Loreen Freud DO   Signed by:   Loreen Freud DO on 06/25/2008   Method used:   Electronically to        [facility]. [phone]* (retail)       [address]       [place], [place]  [address]       Ph: [phone]       Fax: [phone]   RxID:   [id number]  ] Laboratory Results   Urine Tests   Date/Time Reported: June 25, 2008 11:19 AM   Routine Urinalysis   Color: lt. yellow Appearance: Clear Glucose: negative   (Normal Range: Negative) Bilirubin: negative   (Normal Range: Negative) Ketone: negative   (Normal Range: Negative) Spec. Gravity: <1.005   (Normal Range: 1.003-1.035) Blood: large   (Normal Range: Negative) pH: 7.5   (Normal Range: 5.0-8.0) Protein: negative   (Normal Range: Negative) Urobilinogen: 0.2   (Normal Range: 0-1) Nitrite: negative   (Normal Range: Negative) Leukocyte Esterace: negative   (Normal Range: Negative)    Comments: Floydene Flock CMA  June 25, 2008 11:20 AM cx sent

## 2010-07-20 NOTE — Assessment & Plan Note (Signed)
Summary: 1 month roa//lch - rescd cbs   Vital Signs:  Patient profile:   40 year old female Weight:      262.4 pounds BMI:     41.56 Pulse rate:   68 / minute Pulse rhythm:   regular BP sitting:   130 / 80  (right arm) Cuff size:   large  Vitals Entered By: Almeta Monas CMA Duncan Dull) (June 26, 2010 9:31 AM) CC: Weight Check   History of Present Illness: Pt here for weight check.  Pt doing well with diet.  She had D&C in Dec so has not been exercising but will start again.      Current Medications (verified): 1)  Zestoretic 10-12.5 Mg Tabs (Lisinopril-Hydrochlorothiazide) .Marland Kitchen.. 1 By Mouth Once Daily 2)  Lovaza 1 Gm Caps (Omega-3-Acid Ethyl Esters) .... Take 2  Tabs By Mouth Two Times A Day 3)  Adipex-P 37.5 Mg Tabs (Phentermine Hcl) .Marland Kitchen.. 1 By Mouth Qam  Allergies (verified): 1)  ! 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) PAP SMEAR, ABNORMAL (ICD-795.00) ABDOMINAL BLOATING (ICD-787.3) PREVENTIVE HEALTH CARE (ICD-V70.0) FAMILY HISTORY DIABETES 1ST DEGREE RELATIVE (ICD-V18.0)   Hyperlipidemia Hypertension  Family History: Last updated: 04/04/2010 Family History of Arthritis Family History Diabetes 1st degree relative Family History High cholesterol Family History Hypertension Paunt--breast and lung ca Family History of Stroke F 1st degree relative 84 yo---mom Family History of CAD Female--p uncle in late 11s   Social History: Last updated: 06/25/2008 Occupation: [employer] Single Never Smoked Alcohol use-no Drug use-no Regular exercise-yes  Risk Factors: Alcohol Use: 0 (04/04/2010) Caffeine Use: 2 (04/04/2010) Diet: not health (04/04/2010) Exercise: yes (04/04/2010)  Risk Factors: Smoking Status: never (04/04/2010)  Past Surgical History: D&C  ---uterine polyps---  05/31/2010   Grewal  Family History: Reviewed history from 04/04/2010 and no changes required. Family History of Arthritis Family History Diabetes 1st degree relative Family History High cholesterol Family History Hypertension Paunt--breast and lung ca Family History of Stroke F 1st degree relative 36 yo---mom Family History of CAD Female--p uncle in late 47s   Social History: Reviewed history from 06/25/2008 and no changes required. Occupation: [employer] Single Never Smoked Alcohol use-no Drug use-no Regular exercise-yes  Review of Systems      See HPI  Physical Exam  General:  Well-developed,well-nourished,in no acute distress; alert,appropriate and cooperative throughout examination Lungs:  Normal respiratory effort, chest expands symmetrically. Lungs are clear to auscultation, no crackles or wheezes. Heart:  normal rate and no murmur.   Extremities:  No clubbing, cyanosis, edema, or deformity noted with normal full range of motion of all joints.   Psych:  Cognition and judgment appear intact. Alert and cooperative with normal attention span and concentration. No apparent delusions, illusions, hallucinations   Impression & Recommendations:  Problem # 1:  MORBID OBESITY (ICD-278.01)  refill adipex cont diet start exercise again rto 1 month  Ht: 66.75 (04/04/2010)   Wt: 262.4 (06/26/2010)   BMI: 41.56 (06/26/2010)  Problem # 2:  HYPERTENSION (ICD-401.9)  Her updated medication list for this problem includes:    Zestoretic 10-12.5 Mg Tabs (Lisinopril-hydrochlorothiazide) .Marland Kitchen... 1 by mouth once daily  BP today: 130/80 Prior BP: 136/70 (04/04/2010)  Labs Reviewed: K+: 4.5 (04/04/2010) Creat: : 0.7 (04/04/2010)   Chol: 237 (04/04/2010)   HDL: 55.50 (04/04/2010)   LDL: DEL (06/25/2008)   TG: 178.0 (04/04/2010)  Problem # 3:  HYPERLIPIDEMIA (ICD-272.4)  Her updated medication list for this problem includes:    Lovaza 1 Gm Caps (Omega-3-acid ethyl esters) .Marland Kitchen... Take 2   tabs by mouth two times a day  Labs Reviewed: SGOT: 30 (04/04/2010)   SGPT: 29 (04/04/2010)   HDL:55.50 (04/04/2010), 52.1 (06/25/2008)  LDL:DEL (06/25/2008)  Chol:237 (04/04/2010), 232 (06/25/2008)  Trig:178.0 (04/04/2010), 181 (06/25/2008)  Complete Medication List: 1)  Zestoretic 10-12.5 Mg Tabs (Lisinopril-hydrochlorothiazide) .Marland Kitchen.. 1 by mouth once daily 2)  Lovaza 1 Gm Caps (Omega-3-acid ethyl esters) .... Take 2  tabs by mouth two times a day 3)  Adipex-p 37.5 Mg Tabs (Phentermine hcl) .Marland Kitchen.. 1 by mouth qam  Patient Instructions: 1)  272.4  401.9  lipid, hep, bmp--- fasting labs 2)  rto 1 month Prescriptions: ADIPEX-P 37.5 MG TABS (PHENTERMINE HCL) 1 by mouth qam  #30 x 0   Entered and Authorized by:   Loreen Freud DO   Signed by:   Loreen Freud DO on 06/26/2010   Method used:   Print then Give to Patient   RxID:   [id number]    Orders Added: 1)  Est. Patient Level III [46962]

## 2010-07-20 NOTE — Medication Information (Signed)
Summary: Possible Noncompliance with Lisinopril/[employer]  Possible Noncompliance with Lisinopril/[employer]   Imported By: Lanelle Bal 05/06/2009 11:02:24  _____________________________________________________________________  External Attachment:    Type:   Image     Comment:   External Document

## 2010-07-20 NOTE — Letter (Signed)
Summary: Results Follow-up Letter  [place] [facility]  [address] [place], [place] [address]   Phone: [phone]  Fax: [phone]    09/03/2008        Michele Tran [address] [place], [place]  [address]  Dear [name],   The following are the results of your recent test(s):  Test     Result     Pap Smear    Normal_______  Not Normal_____       Comments: _________________________________________________________ Cholesterol LDL(Bad cholesterol):          Your goal is less than:         HDL (Good cholesterol):        Your goal is more than: _________________________________________________________ Other Tests:   _________________________________________________________  Please call for an appointment Or _Please see attached labwork.________________________________________________________ _________________________________________________________ _________________________________________________________  Sincerely,  Ardyth Man [name] at [facility]

## 2010-07-31 ENCOUNTER — Encounter: Payer: Self-pay | Admitting: Family Medicine

## 2010-07-31 ENCOUNTER — Other Ambulatory Visit: Payer: Self-pay | Admitting: Family Medicine

## 2010-07-31 ENCOUNTER — Ambulatory Visit (INDEPENDENT_AMBULATORY_CARE_PROVIDER_SITE_OTHER): Payer: Managed Care, Other (non HMO) | Admitting: Family Medicine

## 2010-07-31 DIAGNOSIS — E785 Hyperlipidemia, unspecified: Secondary | ICD-10-CM

## 2010-07-31 DIAGNOSIS — I10 Essential (primary) hypertension: Secondary | ICD-10-CM

## 2010-07-31 DIAGNOSIS — Z833 Family history of diabetes mellitus: Secondary | ICD-10-CM

## 2010-07-31 LAB — BASIC METABOLIC PANEL
BUN: 10 mg/dL (ref 6–23)
CO2: 27 mEq/L (ref 19–32)
Calcium: 9.1 mg/dL (ref 8.4–10.5)
Chloride: 94 mEq/L — ABNORMAL LOW (ref 96–112)
Creatinine, Ser: 0.8 mg/dL (ref 0.4–1.2)
GFR: 107.71 mL/min (ref >60.00)
Glucose, Bld: 89 mg/dL (ref 70–99)
Potassium: 4 mEq/L (ref 3.5–5.1)
Sodium: 137 mEq/L (ref 135–145)

## 2010-08-09 NOTE — Assessment & Plan Note (Signed)
Summary: 1 MONTH ROA//[facility]---272.4, 401.9, LIP, HEP, BMP   Vital Signs:  Patient profile:   40 year old female Weight:      256.2 pounds Pulse rate:   68 / minute Pulse rhythm:   regular BP sitting:   130 / 86  (left arm) Cuff size:   large  Vitals Entered By: Almeta Monas CMA Duncan Dull) (July 31, 2010 9:21 AM) CC: weight and BP check   History of Present Illness: Pt here f/u weight.  Pt doing well with diet and exercise.  No complaints.    Problems Prior to Update: 1)  Morbid Obesity  (ICD-278.01) 2)  Family History of Cad Female 1st Degree Relative <50  (ICD-V17.3) 3)  Liver Function Tests, Abnormal, Hx of  (ICD-V12.2) 4)  Polycystic Ovaries  (ICD-256.4) 5)  Hypertension  (ICD-401.9) 6)  Hyperlipidemia  (ICD-272.4) 7)  Hirsutism  (ICD-704.1) 8)  Pap Smear, Abnormal  (ICD-795.00) 9)  Abdominal Bloating  (ICD-787.3) 10)  Preventive Health Care  (ICD-V70.0) 11)  Family History Diabetes 1st Degree Relative  (ICD-V18.0)  Medications Prior to Update: 1)  Zestoretic 10-12.5 Mg Tabs (Lisinopril-Hydrochlorothiazide) .Marland Kitchen.. 1 By Mouth Once Daily 2)  Lovaza 1 Gm Caps (Omega-3-Acid Ethyl Esters) .... Take 2  Tabs By Mouth Two Times A Day 3)  Adipex-P 37.5 Mg Tabs (Phentermine Hcl) .Marland Kitchen.. 1 By Mouth Qam  Current Medications (verified): 1)  Zestoretic 10-12.5 Mg Tabs (Lisinopril-Hydrochlorothiazide) .Marland Kitchen.. 1 By Mouth Once Daily 2)  Lovaza 1 Gm Caps (Omega-3-Acid Ethyl Esters) .... Take 2  Tabs By Mouth Two Times A Day 3)  Adipex-P 37.5 Mg Tabs (Phentermine Hcl) .Marland Kitchen.. 1 By Mouth Qam  Allergies (verified): 1)  ! Codeine  Past History:  Past medical, surgical, family and social histories (including risk factors) reviewed for relevance to current acute and chronic problems.  Past Medical History: Reviewed history from 07/14/2008 and no changes required. 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 D&C  ---uterine polyps---  05/31/2010  Grewal  Family History: Reviewed history from 04/04/2010 and no changes required. Family History of Arthritis Family History Diabetes 1st degree relative Family History High cholesterol Family History Hypertension Paunt--breast and lung ca Family History of Stroke F 1st degree relative 39 yo---mom Family History of CAD Female--p uncle in late 29s   Social History: Reviewed history from 06/25/2008 and no changes required. Occupation: [employer] Single Never Smoked Alcohol use-no Drug use-no Regular exercise-yes  Review of Systems      See HPI  Physical Exam  General:  Well-developed,well-nourished,in no acute distress; alert,appropriate and cooperative throughout examination Lungs:  Normal respiratory effort, chest expands symmetrically. Lungs are clear to auscultation, no crackles or wheezes. Heart:  normal rate and no murmur.   Extremities:  No clubbing, cyanosis, edema, or deformity noted with normal full range of motion of all joints.   Psych:  Cognition and judgment appear intact. Alert and cooperative with normal attention span and concentration. No apparent delusions, illusions, hallucinations   Impression & Recommendations:  Problem # 1:  MORBID OBESITY (ICD-278.01) con't diet and exercise Ht: 66.75 (04/04/2010)   Wt: 256.2 (07/31/2010)   BMI: 41.56 (06/26/2010)  Problem # 2:  HYPERTENSION (ICD-401.9)  Her updated medication list for this problem includes:    Zestoretic 10-12.5 Mg  Tabs (Lisinopril-hydrochlorothiazide) .Marland Kitchen... 1 by mouth once daily  BP today: 130/86 Prior BP: 130/80 (06/26/2010)  Labs Reviewed: K+: 4.5 (04/04/2010) Creat: : 0.7  (04/04/2010)   Chol: 237 (04/04/2010)   HDL: 55.50 (04/04/2010)   LDL: DEL (06/25/2008)   TG: 178.0 (04/04/2010)  Complete Medication List: 1)  Zestoretic 10-12.5 Mg Tabs (Lisinopril-hydrochlorothiazide) .Marland Kitchen.. 1 by mouth once daily 2)  Lovaza 1 Gm Caps (Omega-3-acid ethyl esters) .... Take 2  tabs by mouth two times a day 3)  Adipex-p 37.5 Mg Tabs (Phentermine hcl) .Marland Kitchen.. 1 by mouth qam  Patient Instructions: 1)  Please schedule a follow-up appointment in 1 month.  Prescriptions: ADIPEX-P 37.5 MG TABS (PHENTERMINE HCL) 1 by mouth qam  #30 x 0   Entered and Authorized by:   Loreen Freud DO   Signed by:   Loreen Freud DO on 07/31/2010   Method used:   Print then Give to Patient   RxID:   [id number]    Orders Added: 1)  Est. Patient Level III [14782]  Appended Document: Orders Update    Clinical Lists Changes  Orders: Added new Service order of Venipuncture (95621) - Signed Added new Test order of TLB-BMP (Basic Metabolic Panel-BMET) (80048-METABOL) - Signed Added new Test order of T- * Misc. Laboratory test [phone]) - Signed      Appended Document: 1 MONTH ROA//[facility]---272.4, 401.9, LIP, HEP, BMP

## 2010-08-16 ENCOUNTER — Telehealth: Payer: Self-pay | Admitting: Family Medicine

## 2010-08-21 ENCOUNTER — Encounter: Payer: Self-pay | Admitting: Family Medicine

## 2010-08-21 ENCOUNTER — Ambulatory Visit (INDEPENDENT_AMBULATORY_CARE_PROVIDER_SITE_OTHER): Payer: Managed Care, Other (non HMO) | Admitting: Family Medicine

## 2010-08-21 DIAGNOSIS — E785 Hyperlipidemia, unspecified: Secondary | ICD-10-CM

## 2010-08-24 NOTE — Progress Notes (Signed)
Summary: Returning call/ov needed  ---- Converted from flag ---- ---- 08/16/2010 1:37 PM, Loreen Freud DO wrote: needs ov to review labs ------------------------------ I spoke with the pt, Patient left message returning call, she can be reached at [phone]. She made an appt for next week.

## 2010-08-28 ENCOUNTER — Encounter: Payer: Self-pay | Admitting: Family Medicine

## 2010-08-28 ENCOUNTER — Ambulatory Visit (INDEPENDENT_AMBULATORY_CARE_PROVIDER_SITE_OTHER): Payer: Managed Care, Other (non HMO) | Admitting: Family Medicine

## 2010-08-28 DIAGNOSIS — I10 Essential (primary) hypertension: Secondary | ICD-10-CM

## 2010-08-29 NOTE — Assessment & Plan Note (Signed)
Summary: Ov per MD to review labs./kb   Vital Signs:  Patient profile:   40 year old female Weight:      256.2 pounds Pulse rate:   68 / minute Pulse rhythm:   regular BP sitting:   120 / 80  (right arm) Cuff size:   large  Vitals Entered By: Almeta Monas CMA Duncan Dull) (August 21, 2010 8:58 AM) CC: Review [facility]   History of Present Illness: Pt here to review [facility].  No complaints.  Current Medications (verified): 1)  Zestoretic 10-12.5 Mg Tabs (Lisinopril-Hydrochlorothiazide) .Marland Kitchen.. 1 By Mouth Once Daily 2)  Lovaza 1 Gm Caps (Omega-3-Acid Ethyl Esters) .... Take 2  Tabs By Mouth Two Times A Day 3)  Adipex-P 37.5 Mg Tabs (Phentermine Hcl) .Marland Kitchen.. 1 By Mouth Qam 4)  Zocor 20 Mg Tabs (Simvastatin) .Marland Kitchen.. 1 By Mouth At Bedtime  Allergies (verified): 1)  ! Codeine  Past History:  Past medical, surgical, family and social histories (including risk factors) reviewed for relevance to current acute and chronic problems.  Past Medical History: Reviewed history from 07/14/2008 and no changes required. 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 D&C  ---uterine polyps---  05/31/2010  Grewal  Family History: Reviewed history from 04/04/2010 and no changes required. Family History of Arthritis Family History Diabetes 1st degree relative Family History High cholesterol Family History Hypertension Paunt--breast and lung ca Family History of Stroke F 1st degree relative 65 yo---mom Family History of CAD Female--p uncle in late 94s   Social History: Reviewed history from 06/25/2008 and no changes required. Occupation:  [employer] Single Never Smoked Alcohol use-no Drug use-no Regular exercise-yes  Review of Systems      See HPI  Physical Exam  General:  Well-developed,well-nourished,in no acute distress; alert,appropriate and cooperative throughout examination Psych:  Oriented X3 and normally interactive.     Impression & Recommendations:  Problem # 1:  HYPERLIPIDEMIA (ICD-272.4) see [facility]---   Her updated medication list for this problem includes:    Lovaza 1 Gm Caps (Omega-3-acid ethyl esters) .Marland Kitchen... Take 2  tabs by mouth two times a day    Zocor 20 Mg Tabs (Simvastatin) .Marland Kitchen... 1 by mouth at bedtime  Labs Reviewed: SGOT: 30 (04/04/2010)   SGPT: 29 (04/04/2010)   HDL:55.50 (04/04/2010), 52.1 (06/25/2008)  LDL:DEL (06/25/2008)  Chol:237 (04/04/2010), 232 (06/25/2008)  Trig:178.0 (04/04/2010), 181 (06/25/2008)  Complete Medication List: 1)  Zestoretic 10-12.5 Mg Tabs (Lisinopril-hydrochlorothiazide) .Marland Kitchen.. 1 by mouth once daily 2)  Lovaza 1 Gm Caps (Omega-3-acid ethyl esters) .... Take 2  tabs by mouth two times a day 3)  Adipex-p 37.5 Mg Tabs (Phentermine hcl) .Marland Kitchen.. 1 by mouth qam 4)  Zocor 20 Mg Tabs (Simvastatin) .Marland Kitchen.. 1 by mouth at bedtime  Patient Instructions: 1)  fasting labs in 3 months----[facility]  272.4 Prescriptions: ZOCOR 20 MG TABS (SIMVASTATIN) 1 by mouth at bedtime  #30 x 2   Entered and Authorized by:   Loreen Freud DO   Signed by:   Loreen Freud DO on 08/21/2010   Method used:   Electronically to        [facility]. [phone]* (retail)       [address]  [place], [place]  [address]       Ph: [phone]       Fax: [phone]   RxID:   [id number]    Orders Added: 1)  Est. Patient Level III [10272]

## 2010-09-01 ENCOUNTER — Encounter: Payer: Self-pay | Admitting: Family Medicine

## 2010-09-05 NOTE — Assessment & Plan Note (Signed)
Summary: rto 1 month/cbs   Vital Signs:  Patient profile:   40 year old female Weight:      254 pounds Pulse rate:   68 / minute Pulse rhythm:   regular BP sitting:   118 / 80  (right arm) Cuff size:   large  Vitals Entered By: Almeta Monas CMA Duncan Dull) (August 28, 2010 9:43 AM) CC: weight check   History of Present Illness: Pt here for weight check.   Pt is now doing weights and cardio.  Pt is cutting back on red meat and low carb diet.   Pt is eating small meals q2 hours.     Problems Prior to Update: 1)  Morbid Obesity  (ICD-278.01) 2)  Family History of Cad Female 1st Degree Relative <50  (ICD-V17.3) 3)  Liver Function Tests, Abnormal, Hx of  (ICD-V12.2) 4)  Polycystic Ovaries  (ICD-256.4) 5)  Hypertension  (ICD-401.9) 6)  Hyperlipidemia  (ICD-272.4) 7)  Hirsutism  (ICD-704.1) 8)  Pap Smear, Abnormal  (ICD-795.00) 9)  Abdominal Bloating  (ICD-787.3) 10)  Preventive Health Care  (ICD-V70.0) 11)  Family History Diabetes 1st Degree Relative  (ICD-V18.0)  Medications Prior to Update: 1)  Zestoretic 10-12.5 Mg Tabs (Lisinopril-Hydrochlorothiazide) .Marland Kitchen.. 1 By Mouth Once Daily 2)  Lovaza 1 Gm Caps (Omega-3-Acid Ethyl Esters) .... Take 2  Tabs By Mouth Two Times A Day 3)  Adipex-P 37.5 Mg Tabs (Phentermine Hcl) .Marland Kitchen.. 1 By Mouth Qam 4)  Zocor 20 Mg Tabs (Simvastatin) .Marland Kitchen.. 1 By Mouth At Bedtime  Current Medications (verified): 1)  Zestoretic 10-12.5 Mg Tabs (Lisinopril-Hydrochlorothiazide) .Marland Kitchen.. 1 By Mouth Once Daily 2)  Lovaza 1 Gm Caps (Omega-3-Acid Ethyl Esters) .... Take 2  Tabs By Mouth Two Times A Day 3)  Adipex-P 37.5 Mg Tabs (Phentermine Hcl) .Marland Kitchen.. 1 By Mouth Qam 4)  Zocor 20 Mg Tabs (Simvastatin) .Marland Kitchen.. 1 By Mouth At Bedtime  Allergies (verified): 1)  ! 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) PAP SMEAR, ABNORMAL  (ICD-795.00) ABDOMINAL BLOATING (ICD-787.3) PREVENTIVE HEALTH CARE (ICD-V70.0) FAMILY HISTORY DIABETES 1ST DEGREE RELATIVE (ICD-V18.0)   Hyperlipidemia Hypertension  Past Surgical History: Last updated: 06/26/2010 D&C  ---uterine polyps---  05/31/2010  Grewal  Family History: Last updated: 04/04/2010 Family History of Arthritis Family History Diabetes 1st degree relative Family History High cholesterol Family History Hypertension Paunt--breast and lung ca Family History of Stroke F 1st degree relative 56 yo---mom Family History of CAD Female--p uncle in late 22s   Social History: Last updated: 06/25/2008 Occupation: [employer] Single Never Smoked Alcohol use-no Drug use-no Regular exercise-yes  Risk Factors: Alcohol Use: 0 (04/04/2010) Caffeine Use: 2 (04/04/2010) Diet: not health (04/04/2010) Exercise: yes (04/04/2010)  Risk Factors: Smoking Status: never (04/04/2010)  Family History: Reviewed history from 04/04/2010 and no changes required. Family History of Arthritis Family History Diabetes 1st degree relative Family History High cholesterol Family History Hypertension Paunt--breast and lung ca Family History of Stroke F 1st degree relative 29 yo---mom Family History of CAD Female--p uncle in late 58s   Social History: Reviewed history from 06/25/2008 and no changes required. Occupation: [employer] Single Never Smoked Alcohol use-no Drug use-no Regular exercise-yes  Review of Systems      See HPI  Physical Exam  General:  Well-developed,well-nourished,in no acute distress; alert,appropriate and cooperative throughout examination Lungs:  Normal respiratory effort, chest expands symmetrically. Lungs are clear to auscultation, no crackles or wheezes. Heart:  normal rate and no murmur.   Extremities:  No clubbing, cyanosis, edema, or deformity noted with normal full range of motion of all joints.   Psych:  Cognition and judgment appear  intact. Alert and cooperative with normal attention span and concentration. No apparent delusions, illusions, hallucinations   Impression & Recommendations:  Problem # 1:  MORBID OBESITY (ICD-278.01) con't with weights and aerobic exercise Ht: 66.75 (04/04/2010)   Wt: 254 (08/28/2010)   BMI: 41.56 (06/26/2010)  Problem # 2:  HYPERTENSION (ICD-401.9)  Her updated medication list for this problem includes:    Zestoretic 10-12.5 Mg Tabs (Lisinopril-hydrochlorothiazide) .Marland Kitchen... 1 by mouth once daily  BP today: 118/80 Prior BP: 120/80 (08/21/2010)  Labs Reviewed: K+: 4.0 (07/31/2010) Creat: : 0.8 (07/31/2010)   Chol: 237 (04/04/2010)   HDL: 55.50 (04/04/2010)   LDL: DEL (06/25/2008)   TG: 178.0 (04/04/2010)  Complete Medication List: 1)  Zestoretic 10-12.5 Mg Tabs (Lisinopril-hydrochlorothiazide) .Marland Kitchen.. 1 by mouth once daily 2)  Lovaza 1 Gm Caps (Omega-3-acid ethyl esters) .... Take 2  tabs by mouth two times a day 3)  Adipex-p 37.5 Mg Tabs (Phentermine hcl) .Marland Kitchen.. 1 by mouth qam 4)  Zocor 20 Mg Tabs (Simvastatin) .Marland Kitchen.. 1 by mouth at bedtime  Patient Instructions: 1)  Please schedule a follow-up appointment in 1 month.  Prescriptions: ADIPEX-P 37.5 MG TABS (PHENTERMINE HCL) 1 by mouth qam  #30 x 0   Entered and Authorized by:   Loreen Freud DO   Signed by:   Loreen Freud DO on 08/28/2010   Method used:   Print then Give to Patient   RxID:   [id number]    Orders Added: 1)  Est. Patient Level III [08657]

## 2010-09-26 ENCOUNTER — Encounter: Payer: Self-pay | Admitting: Family Medicine

## 2010-10-02 ENCOUNTER — Ambulatory Visit: Payer: Managed Care, Other (non HMO) | Admitting: Family Medicine

## 2010-10-09 ENCOUNTER — Ambulatory Visit (INDEPENDENT_AMBULATORY_CARE_PROVIDER_SITE_OTHER): Payer: Managed Care, Other (non HMO) | Admitting: Family Medicine

## 2010-10-09 ENCOUNTER — Encounter: Payer: Self-pay | Admitting: Family Medicine

## 2010-10-09 DIAGNOSIS — L259 Unspecified contact dermatitis, unspecified cause: Secondary | ICD-10-CM

## 2010-10-09 DIAGNOSIS — L309 Dermatitis, unspecified: Secondary | ICD-10-CM

## 2010-10-09 MED ORDER — FLUOCINONIDE 0.05 % EX CREA: TOPICAL_CREAM | CUTANEOUS | Status: DC

## 2010-10-09 MED ORDER — PHENTERMINE HCL 37.5 MG PO TABS: 37.5000 mg | ORAL_TABLET | Freq: Every day | ORAL | Status: DC

## 2010-10-09 NOTE — Assessment & Plan Note (Signed)
con't diet and exercise Refill phenteramine rto 1 month--- if we don't see at least 4 lb loss we will not refill med

## 2010-10-09 NOTE — Assessment & Plan Note (Signed)
Cream refilled No problems

## 2010-10-09 NOTE — Patient Instructions (Signed)
Calorie Counting Diet A calorie counting diet requires you to eat the number of calories that are right for you during a day. Calories are the measurement of how much energy you get from the food you eat. Eating the right amount of calories is important for staying at a healthy weight. If you eat too many calories your body will store them as fat and you may gain weight. If you eat too few calories you may lose weight. Counting the number of calories that you eat during a day will help you to know if you're eating the right amount. A Registered Dietitian can determine how many calories you need in a day. The amount of calories you need varies from person to person. If your goal is to lose weight you will need to eat fewer calories. Losing weight can benefit you if you are overweight or have health problems such as heart disease, high blood pressure or diabetes. If your goal is to gain weight, you will need to eat more calories. Gaining weight may be necessary if you have a certain health problem that causes your body to need more energy. TIPS Whether you are increasing or decreasing the number of calories you eat during a day, it may be hard to get used to changing what you eat and drink. The following are tips to help you keep track of the number of calories you are eating.  Measuring foods at home with measuring cups will help you to know the actual amount of food and number of calories you are eating.   Restaurants serve food in all different portion sizes. It is common that restaurants will serve food in amounts worth 2 or more serving sizes. While eating out, it may be helpful to estimate how many servings of a food you are given. For example, a serving of cooked rice is 1/2 cup and that is the size of half of a fist. Knowing serving sizes will help you have a better idea of how much food you are eating at restaurants.   Ask for smaller portion sizes or child-size portions at restaurants.   Plan to  eat half of a meal at a restaurant and take the rest home or share the other half with a friend   Read food labels for calorie content and serving size   Most packaged food has a Nutrition Facts Panel on its side or back. Here you can find out how many servings are in a package, the size of a serving, and the number of calories each serving has.   The serving size and number of servings per container are listed right below the Nutrition Facts heading. Just below the serving information, the number of calories in each serving is listed.   For example, say that a package has three cookies inside. The Nutrition Facts panel says that one serving is one cookie. Below that, it says that there are three servings in the container. The calories section of the Nutrition Facts says there are 90 calories. That means that there are 90 calories in one cookie. If you eat one cookie you have eaten 90 calories. If you eat all three cookies, you have eaten three times that amount, or 270 calories.  The list below tells you how big or small some common portion sizes are.  1 ounce (oz).................4 stacked dice.   3 oz..............................Deck of cards.   1 teaspoon (tsp)...........Tip of little finger.   1 tablespoon (Tbsp)....Tip of thumb.     2 Tbsp..........................Golf ball.    Cup..........................Half of a fist.   1 Cup...........................A fist.  KEEP A FOOD LOG Write down every food item that you eat, how much of the food you eat, and the number of calories in each food that you eat during the day. At the end of the day or throughout the day you can add up the total number of calories you have eaten.  It may help to set up a list like the one below. Find out the calorie information by reading food labels.  Breakfast   Bran Flakes (1 cup, 110 calories).   Fat free milk ( cup, 45 calories).   Snack   Apple (1 medium, 80 calories).   Lunch   Spinach (1  cup, 20 calories).   Tomato ( medium, 20 calories).   Chicken breast strips (3 oz, 165 calories).   Shredded cheddar cheese ( cup, 110 calories).   Light Italian dressing (2 Tbsp, 60 calories).   Whole wheat bread (1 slice, 80 calories).   Tub margarine (1 tsp, 35 calories).   Vegetable soup (1 cup, 160 calories).   Dinner   Pork chop (3 oz, 190 calories).   Brown rice (1 cup, 215 calories).   Steamed broccoli ( cup, 20 calories).   Strawberries (1  cup, 65 calories).   Whipped cream (1 Tbsp, 50 calories).  Daily Calorie Total: 1425 Information from www.eatright.org, Foodwise Nutritional Analysis Database. Document Released: 06/04/2005 Document Re-Released: 06/26/2009 ExitCare Patient Information 2011 [employer]. 

## 2010-10-09 NOTE — Progress Notes (Signed)
  Subjective:    Patient ID: Michele Tran, female    DOB: 1971-04-01, 40 y.o.   MRN: [id number]  HPI Pt here f/u obesity.  Pt watching what she eats--no fried food,  Decreased starches.  Pt is exercising every other day for 30 min and walks everyday.  No complaints   Review of Systems  Constitutional: Negative.   Respiratory: Negative.   Cardiovascular: Negative.   Psychiatric/Behavioral: Negative.        Objective:   Physical Exam  Constitutional: She is oriented to person, place, and time. She appears well-developed and well-nourished.  Cardiovascular: Normal rate and regular rhythm.   No murmur heard. Pulmonary/Chest: Effort normal and breath sounds normal. No respiratory distress. She has no wheezes. She has no rales.  Neurological: She is alert and oriented to person, place, and time.  Skin: Skin is warm and dry. No rash noted.  Psychiatric: She has a normal mood and affect. Her behavior is normal. Judgment and thought content normal.          Assessment & Plan:

## 2010-11-06 ENCOUNTER — Ambulatory Visit (INDEPENDENT_AMBULATORY_CARE_PROVIDER_SITE_OTHER): Payer: Managed Care, Other (non HMO) | Admitting: Family Medicine

## 2010-11-06 ENCOUNTER — Encounter: Payer: Self-pay | Admitting: Family Medicine

## 2010-11-06 DIAGNOSIS — I10 Essential (primary) hypertension: Secondary | ICD-10-CM

## 2010-11-06 DIAGNOSIS — E785 Hyperlipidemia, unspecified: Secondary | ICD-10-CM

## 2010-11-06 DIAGNOSIS — E669 Obesity, unspecified: Secondary | ICD-10-CM

## 2010-11-06 MED ORDER — LISINOPRIL 10 MG PO TABS: 10.0000 mg | ORAL_TABLET | Freq: Every day | ORAL | Status: DC

## 2010-11-06 MED ORDER — FUROSEMIDE 20 MG PO TABS: 20.0000 mg | ORAL_TABLET | Freq: Every day | ORAL | Status: DC

## 2010-11-06 NOTE — Assessment & Plan Note (Signed)
Doing great!  Keep up good work. rto 1 month

## 2010-11-06 NOTE — Assessment & Plan Note (Signed)
con't meds  Check labs 

## 2010-11-06 NOTE — Patient Instructions (Signed)
Obesity Obesity is defined as having a Body Mass Index (BMI) of 30 or more. To calculate your BMI divide your weight in pounds by your height in inches squared and multiply that product by 703. Major illnesses resulting from long-term obesity include:  Stroke.   Heart disease.   Diabetes.   Many cancers.   Arthritis.  Obesity also complicates recovery from many other medical problems.  CAUSES  A history of obesity in your parents.   Thyroid hormone imbalance.   Environmental factors such as excess calorie intake and physical inactivity.  TREATMENT A healthy weight loss program includes:  A calorie restricted diet based on individual calorie needs.   Increased physical activity (exercise).  An exercise program is just as important as the right low calorie diet.  Weight-loss medicines should be used only under the supervision of your physician. These medicines help, but only if they are used with diet and exercise programs. Medicines can have side effects including nervousness, nausea, abdominal pain, diarrhea, headache, drowsiness, and depression.  An unhealthy weight loss program includes:  Fasting.   Fad diets.   Supplements and drugs.  These choices do not succeed in long-term weight control.  HOME CARE INSTRUCTIONS To help you make the needed dietary changes:   Keep a daily record of everything you eat. There are many free websites to help you with this. It may be helpful to measure your foods so you can determine if you are eating the correct portion sizes.   Use low-calorie cookbooks or take special cooking classes.   Avoid alcohol. Drink more water and drinks with no calories.   Take vitamins and supplements only as recommended by your caregiver.   Weight-loss support groups, Registered Dieticians, counselors, and stress reduction education can also be very helpful.  PREVENTION Losing weight and keeping it off takes time, discipline, a healthy diet and regular  exercise. Document Released: 07/12/2004 Document Re-Released: 06/24/2007 ExitCare Patient Information 2011 [employer]. 

## 2010-11-06 NOTE — Assessment & Plan Note (Signed)
Stable con't meds 

## 2010-11-06 NOTE — Progress Notes (Signed)
  Subjective:    Patient ID: Michele Tran, female    DOB: 02/23/1971, 40 y.o.   MRN: [id number]  HPI Pt here for weight check. Pt doing well with mediterranian diet and exercising qo day.  No complaint  Except low ext swelling.  nocp, sob, ha palp etc.    Review of Systems    as above Objective:   Physical Exam  Constitutional: She is oriented to person, place, and time. She appears well-developed and well-nourished.  Cardiovascular: Normal rate and regular rhythm.   No murmur heard. Pulmonary/Chest: Effort normal and breath sounds normal. No respiratory distress. She has no wheezes. She has no rales. She exhibits no tenderness.  Musculoskeletal: She exhibits edema.       Trace pitting edema only  Neurological: She is alert and oriented to person, place, and time.  Psychiatric: She has a normal mood and affect. Her behavior is normal.          Assessment & Plan:

## 2010-11-07 ENCOUNTER — Other Ambulatory Visit: Payer: Self-pay | Admitting: *Deleted

## 2010-11-07 DIAGNOSIS — E785 Hyperlipidemia, unspecified: Secondary | ICD-10-CM

## 2010-11-07 DIAGNOSIS — I10 Essential (primary) hypertension: Secondary | ICD-10-CM

## 2010-11-08 ENCOUNTER — Other Ambulatory Visit (INDEPENDENT_AMBULATORY_CARE_PROVIDER_SITE_OTHER): Payer: Managed Care, Other (non HMO)

## 2010-11-08 DIAGNOSIS — E785 Hyperlipidemia, unspecified: Secondary | ICD-10-CM

## 2010-11-08 DIAGNOSIS — I10 Essential (primary) hypertension: Secondary | ICD-10-CM

## 2010-11-08 LAB — LIPID PANEL
Cholesterol: 148 mg/dL (ref 0–200)
HDL: 53.4 mg/dL (ref >39.00)
LDL Cholesterol: 73 mg/dL (ref 0–99)
Total CHOL/HDL Ratio: 3
Triglycerides: 109 mg/dL (ref 0.0–149.0)
VLDL: 21.8 mg/dL (ref 0.0–40.0)

## 2010-11-08 LAB — HEPATIC FUNCTION PANEL
ALT: 18 U/L (ref 0–35)
AST: 23 U/L (ref 0–37)
Albumin: 4.1 g/dL (ref 3.5–5.2)
Alkaline Phosphatase: 53 U/L (ref 39–117)
Bilirubin, Direct: 0.2 mg/dL (ref 0.0–0.3)
Total Bilirubin: 1.4 mg/dL — ABNORMAL HIGH (ref 0.3–1.2)
Total Protein: 7.9 g/dL (ref 6.0–8.3)

## 2010-11-08 LAB — BASIC METABOLIC PANEL
BUN: 10 mg/dL (ref 6–23)
CO2: 28 mEq/L (ref 19–32)
Calcium: 9.3 mg/dL (ref 8.4–10.5)
Chloride: 100 mEq/L (ref 96–112)
Creatinine, Ser: 0.8 mg/dL (ref 0.4–1.2)
GFR: 97.28 mL/min (ref >60.00)
Glucose, Bld: 104 mg/dL — ABNORMAL HIGH (ref 70–99)
Potassium: 4.1 mEq/L (ref 3.5–5.1)
Sodium: 137 mEq/L (ref 135–145)

## 2010-11-27 ENCOUNTER — Other Ambulatory Visit (INDEPENDENT_AMBULATORY_CARE_PROVIDER_SITE_OTHER): Payer: Managed Care, Other (non HMO)

## 2010-11-27 DIAGNOSIS — E785 Hyperlipidemia, unspecified: Secondary | ICD-10-CM

## 2010-11-27 NOTE — Progress Notes (Signed)
Labs only

## 2010-11-28 ENCOUNTER — Other Ambulatory Visit: Payer: Self-pay | Admitting: Family Medicine

## 2010-12-16 ENCOUNTER — Other Ambulatory Visit: Payer: Self-pay | Admitting: Family Medicine

## 2010-12-19 ENCOUNTER — Telehealth: Payer: Self-pay

## 2010-12-19 NOTE — Telephone Encounter (Signed)
Results from [facility] are back and per Dr.Lowne patient 's needs to increase her HDL cholesterol. She is to start Niaspan  500 mg qhs for 1 month then we will increase to 1000 mg qhs. Give patient a coupon card also.    Mssg has been left for the patient to return a call to the office     KP

## 2010-12-21 MED ORDER — NIACIN ER (ANTIHYPERLIPIDEMIC) 500 MG PO TBCR: EXTENDED_RELEASE_TABLET | ORAL | Status: DC

## 2010-12-21 NOTE — Telephone Encounter (Signed)
Discussed with patient and she voiced understanding. Rx sent to [facility]    KP

## 2011-01-03 ENCOUNTER — Encounter: Payer: Self-pay | Admitting: Family Medicine

## 2011-01-12 ENCOUNTER — Telehealth: Payer: Self-pay | Admitting: Family Medicine

## 2011-01-12 NOTE — Telephone Encounter (Signed)
Pt called says that niaspan is causing flushing has tried taking extra aspirin and has itching sensation. Would like to know what she should do.  Left msg on voicemail.

## 2011-01-12 NOTE — Telephone Encounter (Signed)
Spoke w/ pt informed that it is recommended that you have a snack when taking medication along w/ aspirin to help minimize flushing sensation. Also recommended to try taking medication earlier in the day to help prevent any interruption of sleep due to symptoms. Pt ok'd information and will call if this isn't working for her and symptoms still present.

## 2011-02-17 ENCOUNTER — Other Ambulatory Visit: Payer: Self-pay | Admitting: Family Medicine

## 2011-04-09 ENCOUNTER — Observation Stay (HOSPITAL_COMMUNITY)
Admission: EM | Admit: 2011-04-09 | Discharge: 2011-04-12 | DRG: 916 | Disposition: A | Discharge status: Home / Self Care | Payer: Managed Care, Other (non HMO) | Attending: Pulmonary Disease | Admitting: Pulmonary Disease

## 2011-04-09 DIAGNOSIS — J988 Other specified respiratory disorders: Secondary | ICD-10-CM | POA: Insufficient documentation

## 2011-04-09 DIAGNOSIS — R7309 Other abnormal glucose: Secondary | ICD-10-CM | POA: Diagnosis not present

## 2011-04-09 DIAGNOSIS — E7800 Pure hypercholesterolemia, unspecified: Secondary | ICD-10-CM | POA: Diagnosis present

## 2011-04-09 DIAGNOSIS — E46 Unspecified protein-calorie malnutrition: Secondary | ICD-10-CM | POA: Diagnosis present

## 2011-04-09 DIAGNOSIS — I10 Essential (primary) hypertension: Secondary | ICD-10-CM | POA: Diagnosis present

## 2011-04-09 DIAGNOSIS — T46905A Adverse effect of unspecified agents primarily affecting the cardiovascular system, initial encounter: Secondary | ICD-10-CM | POA: Diagnosis present

## 2011-04-09 DIAGNOSIS — T783XXA Angioneurotic edema, initial encounter: Principal | ICD-10-CM | POA: Diagnosis present

## 2011-04-09 DIAGNOSIS — Z79899 Other long term (current) drug therapy: Secondary | ICD-10-CM

## 2011-04-10 DIAGNOSIS — I10 Essential (primary) hypertension: Secondary | ICD-10-CM

## 2011-04-10 DIAGNOSIS — Z888 Allergy status to other drugs, medicaments and biological substances status: Secondary | ICD-10-CM

## 2011-04-10 DIAGNOSIS — T783XXA Angioneurotic edema, initial encounter: Secondary | ICD-10-CM

## 2011-04-10 DIAGNOSIS — T46905A Adverse effect of unspecified agents primarily affecting the cardiovascular system, initial encounter: Secondary | ICD-10-CM

## 2011-04-10 LAB — URINE MICROSCOPIC-ADD ON

## 2011-04-10 LAB — DIFFERENTIAL
Basophils Absolute: 0 10*3/uL (ref 0.0–0.1)
Basophils Relative: 0 % (ref 0–1)
Eosinophils Absolute: 0 10*3/uL (ref 0.0–0.7)
Eosinophils Relative: 0 % (ref 0–5)
Lymphocytes Relative: 13 % (ref 12–46)
Lymphs Abs: 2.4 10*3/uL (ref 0.7–4.0)
Monocytes Absolute: 0.3 10*3/uL (ref 0.1–1.0)
Monocytes Relative: 1 % — ABNORMAL LOW (ref 3–12)
Neutro Abs: 16.4 10*3/uL — ABNORMAL HIGH (ref 1.7–7.7)
Neutrophils Relative %: 86 % — ABNORMAL HIGH (ref 43–77)

## 2011-04-10 LAB — URINALYSIS, ROUTINE W REFLEX MICROSCOPIC
Bilirubin Urine: NEGATIVE
Glucose, UA: NEGATIVE mg/dL
Ketones, ur: 40 mg/dL — AB
Nitrite: NEGATIVE
Protein, ur: NEGATIVE mg/dL
Specific Gravity, Urine: 1.013 (ref 1.005–1.030)
Urobilinogen, UA: 0.2 mg/dL (ref 0.0–1.0)
pH: 5 (ref 5.0–8.0)

## 2011-04-10 LAB — POCT I-STAT, CHEM 8
BUN: 11 mg/dL (ref 6–23)
Calcium, Ion: 1.13 mmol/L (ref 1.12–1.32)
Chloride: 104 mEq/L (ref 96–112)
Creatinine, Ser: 0.8 mg/dL (ref 0.50–1.10)
Glucose, Bld: 211 mg/dL — ABNORMAL HIGH (ref 70–99)
HCT: 44 % (ref 36.0–46.0)
Hemoglobin: 15 g/dL (ref 12.0–15.0)
Potassium: 4 mEq/L (ref 3.5–5.1)
Sodium: 136 mEq/L (ref 135–145)
TCO2: 20 mmol/L (ref 0–100)

## 2011-04-10 LAB — MRSA PCR SCREENING: MRSA by PCR: NEGATIVE

## 2011-04-10 LAB — PROTIME-INR
INR: 1.01 (ref 0.00–1.49)
Prothrombin Time: 13.5 seconds (ref 11.6–15.2)

## 2011-04-10 LAB — CBC
HCT: 40.2 % (ref 36.0–46.0)
Hemoglobin: 12.6 g/dL (ref 12.0–15.0)
MCH: 25.7 pg — ABNORMAL LOW (ref 26.0–34.0)
MCHC: 31.3 g/dL (ref 30.0–36.0)
MCV: 82 fL (ref 78.0–100.0)
Platelets: 426 10*3/uL — ABNORMAL HIGH (ref 150–400)
RBC: 4.9 MIL/uL (ref 3.87–5.11)
RDW: 15.5 % (ref 11.5–15.5)
WBC: 19.1 10*3/uL — ABNORMAL HIGH (ref 4.0–10.5)

## 2011-04-10 LAB — POCT PREGNANCY, URINE: Preg Test, Ur: NEGATIVE

## 2011-04-11 LAB — CBC
HCT: 33.8 % — ABNORMAL LOW (ref 36.0–46.0)
Hemoglobin: 10.8 g/dL — ABNORMAL LOW (ref 12.0–15.0)
MCH: 26 pg (ref 26.0–34.0)
MCHC: 32 g/dL (ref 30.0–36.0)
MCV: 81.3 fL (ref 78.0–100.0)
Platelets: 351 10*3/uL (ref 150–400)
RBC: 4.16 MIL/uL (ref 3.87–5.11)
RDW: 15.4 % (ref 11.5–15.5)
WBC: 17.9 10*3/uL — ABNORMAL HIGH (ref 4.0–10.5)

## 2011-04-11 LAB — BASIC METABOLIC PANEL
BUN: 10 mg/dL (ref 6–23)
CO2: 24 mEq/L (ref 19–32)
Calcium: 9.2 mg/dL (ref 8.4–10.5)
Chloride: 104 mEq/L (ref 96–112)
Creatinine, Ser: 0.67 mg/dL (ref 0.50–1.10)
GFR calc Af Amer: >90 mL/min (ref >90)
GFR calc non Af Amer: >90 mL/min (ref >90)
Glucose, Bld: 210 mg/dL — ABNORMAL HIGH (ref 70–99)
Potassium: 4.2 mEq/L (ref 3.5–5.1)
Sodium: 138 mEq/L (ref 135–145)

## 2011-04-11 LAB — C3 COMPLEMENT: C3 Complement: 183 mg/dL — ABNORMAL HIGH (ref 90–180)

## 2011-04-11 LAB — C4 COMPLEMENT: Complement C4, Body Fluid: 43 mg/dL — ABNORMAL HIGH (ref 10–40)

## 2011-04-12 DIAGNOSIS — R609 Edema, unspecified: Secondary | ICD-10-CM

## 2011-04-12 DIAGNOSIS — T783XXA Angioneurotic edema, initial encounter: Secondary | ICD-10-CM

## 2011-04-12 DIAGNOSIS — Z888 Allergy status to other drugs, medicaments and biological substances status: Secondary | ICD-10-CM

## 2011-04-12 DIAGNOSIS — I10 Essential (primary) hypertension: Secondary | ICD-10-CM

## 2011-04-12 LAB — BASIC METABOLIC PANEL
BUN: 18 mg/dL (ref 6–23)
CO2: 27 mEq/L (ref 19–32)
Calcium: 8.5 mg/dL (ref 8.4–10.5)
Chloride: 106 mEq/L (ref 96–112)
Creatinine, Ser: 0.81 mg/dL (ref 0.50–1.10)
GFR calc Af Amer: >90 mL/min (ref >90)
GFR calc non Af Amer: >90 mL/min (ref >90)
Glucose, Bld: 110 mg/dL — ABNORMAL HIGH (ref 70–99)
Potassium: 3.9 mEq/L (ref 3.5–5.1)
Sodium: 139 mEq/L (ref 135–145)

## 2011-04-12 LAB — CBC
HCT: 31.1 % — ABNORMAL LOW (ref 36.0–46.0)
Hemoglobin: 9.7 g/dL — ABNORMAL LOW (ref 12.0–15.0)
MCH: 25.5 pg — ABNORMAL LOW (ref 26.0–34.0)
MCHC: 31.2 g/dL (ref 30.0–36.0)
MCV: 81.8 fL (ref 78.0–100.0)
Platelets: 339 10*3/uL (ref 150–400)
RBC: 3.8 MIL/uL — ABNORMAL LOW (ref 3.87–5.11)
RDW: 15.6 % — ABNORMAL HIGH (ref 11.5–15.5)
WBC: 18.7 10*3/uL — ABNORMAL HIGH (ref 4.0–10.5)

## 2011-04-12 LAB — HEMOGLOBIN A1C
Hgb A1c MFr Bld: 6.2 % — ABNORMAL HIGH (ref <5.7)
Mean Plasma Glucose: 131 mg/dL — ABNORMAL HIGH (ref <117)

## 2011-04-12 NOTE — Discharge Summary (Signed)
Michele Tran, Michele Tran NO.:  [id number]  MEDICAL RECORD NO.:  [id number]  LOCATION:  1225                         FACILITY:  [facility]  PHYSICIAN:  Charlaine Dalton. Sherene Sires, MD, FCCPDATE OF BIRTH:  1970-07-22  DATE OF ADMISSION:  04/09/2011 DATE OF DISCHARGE:  04/12/2011                              DISCHARGE SUMMARY   DISCHARGE DIAGNOSES: 1. Upper respiratory obstruction/angioedema. 2. Hypertension. 3. Hyperglycemia.  HISTORY OF PRESENT ILLNESS:  Michele Tran is a 40 year old African American female with history of hypertension who has been on lisinopril for approximately 1 year, who presented to the [facility] Emergency Room on April 09, 2011, with 1-day history of severe facial swelling. Pulmonary Critical Care was called in the emergency room for ICU management of likely angioedema.  LABORATORY DATA:  At the time of admission, basic metabolic panel, sodium 137, potassium 4.1, CO2 28, glucose 104, BUN 10, creatinine 0.8. CBC:  White blood cells 19.1, hemoglobin 12.6, hematocrit 40.2, and platelets 426.  INR 1.01.  Urine pregnancy was negative.  Urinalysis: Trace leukocytes, otherwise negative.  OTHER PERTINENT LABORATORY DATA:  C3 complement 183, C4 complement 43. Most recent laboratory data, April 12, 2011, CBC:  White blood cells 18.7, hemoglobin 9.7, hematocrit 31.1, platelets 339.  Basic metabolic panel:  Sodium 139, potassium 3.9, glucose 110, BUN 18, creatinine 0.81.  RADIOLOGY DATA:  No radiology data available this admission.  MICROBIOLOGY DATA:  No micro data available this admission.  HOSPITAL COURSE BY DISCHARGE DIAGNOSES: 1. 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  She has been on lisinopril for     approximately 1 year, and only 50% of cases of angioedema     associated with lisinopril occur after the first month of therapy.     She had significant left facial and periorbital swelling,  however,     did not appear to have any tongue, soft palate, or other airway     swelling.  No hoarseness or difficulty swallowing.  It appeared     that her angioedema was limited to the face and not involving the     airway swelling, improved very quickly with Solu-Medrol, Benadryl,     and Pepcid.  The patient did not require any intubation or     mechanical ventilation.  She was initially treated with IV Solu-     Medrol and has been transitioned to p.o. prednisone with a very     short taper planned.  C3 and C4 were essentially normal.  Trypsin     level was checked and is still pending.  The patient will be discharged     home with outpatient     followup from Pulmonary standpoint as well as Primary Care.  The     patient has been instructed to list Ace inhibitors as an allergy.     She has also been instructed to call our office and/or go to the     emergency room if she becomes worse, has difficulty swallowing, or     experiences any further tongue or facial swelling. 2. Hypertension.  Blood pressure has been stable during this admission  off antihypertensives.  We will discharge her off antihypertensive     and let her primary care physician determine further course for     treatment of hypertension in outpatient setting. 3. Hyperglycemia in the setting of Solu-Medrol.  Glucose has trended     back down to within normal range with tapering of Solu-Medrol.  DISCHARGE MEDICATIONS: 1. Benadryl 25 mg p.o. q.6 hours x3 more days. 2. Pepcid 20 mg p.o. at bedtime x3 more days. 3. Prednisone 10-mg tabs, the patient is to take 4 tabs daily x2 days,     then 3 tabs daily x1 day, then 2 daily x1 day, then 1 daily x1 day,     and then stop. 4. Advil 200-400 mg p.o. daily as needed for pain. 5. Aspirin 81 mg daily. 6. Female hormones  black cohosh 1 tablet p.o. daily. 7. The patient's previous oral contraceptives 1 tablet daily. 8. Lasix 20 mg p.o. daily. 9. Lovaza 2 capsules p.o.  b.i.d. 10.Zocor 20 mg p.o. at bedtime. 11.Woman's Health 1 a day, multivitamin 1 tab p.o. daily. 12.The patient is to stop taking lisinopril.  DISCHARGE ACTIVITY:  No restrictions.  DISCHARGE DIET:  Low-sodium, heart-healthy diet.  FOLLOWUP APPOINTMENTS: 1. Dr. Sherene Sires on April 17, 2011, at 10:15 a.m. 2. Dr. Laury Axon, her primary care physician, on April 19, 2011, at 9:15     a.m.  DISPOSITION:  The patient has met maximum benefit from her inpatient hospitalization.  She is medically cleared and ready for discharge home. Her angioedema secondary to ACE inhibitor use is resolving and hypertension will continue be followed on an outpatient basis by her primary care physician.  She was discharged home.  Greater than 30 minutes was spent on this discharge.     [name], NP   ______________________________ Charlaine Dalton. Sherene Sires, MD, FCCP    KW/MEDQ  D:  04/12/2011  T:  04/12/2011  Job:  [id number]  cc:   Lelon Perla, DO [address] [place], [place] [address]

## 2011-04-14 LAB — TRYPSINOGEN, BLOOD: Trypsinogen: 24 ng/mL (ref 19–68)

## 2011-04-17 ENCOUNTER — Ambulatory Visit (INDEPENDENT_AMBULATORY_CARE_PROVIDER_SITE_OTHER): Payer: Managed Care, Other (non HMO) | Admitting: Internal Medicine

## 2011-04-17 ENCOUNTER — Encounter: Payer: Self-pay | Admitting: Internal Medicine

## 2011-04-17 DIAGNOSIS — T783XXA Angioneurotic edema, initial encounter: Secondary | ICD-10-CM

## 2011-04-17 DIAGNOSIS — I10 Essential (primary) hypertension: Secondary | ICD-10-CM

## 2011-04-17 NOTE — Patient Instructions (Addendum)
You need to avoid ace inhibitors forever based on the severity of your reaction and would consider adding generic norvasc at this point but prefer Dr Laury Axon make that decision  No more prednisone needed at this point but call if the swelling worsens again as it takes weeks for the full effect of the ace inhibitors to wash out and you will be a bit more prone to coughing during this time - ok to take benadryl.

## 2011-04-17 NOTE — Progress Notes (Signed)
Subjective:     Patient ID: Michele Tran, female   DOB: 24-Sep-1970, 40 y.o.   MRN: [id number]  HPI  35 yobf with obesity hbp, dm admit [facility] ICU  04/09/11 with angioedema but did not required ET  DATE OF ADMISSION: 04/09/2011  DATE OF DISCHARGE: 04/12/2011  DISCHARGE SUMMARY  DISCHARGE DIAGNOSES:  1. Upper respiratory obstruction/angioedema.  2. Hypertension.  3. Hyperglycemia  D/c meds 1. Benadryl 25 mg p.o. q.6 hours x3 more days.  2. Pepcid 20 mg p.o. at bedtime x3 more days.  3. Prednisone 10-mg tabs, the patient is to take 4 tabs daily x2 days,  then 3 tabs daily x1 day, then 2 daily x1 day, then 1 daily x1 day,  and then stop.  4. Advil 200-400 mg p.o. daily as needed for pain.  5. Aspirin 81 mg daily.  6. Female hormones black cohosh 1 tablet p.o. daily.  7. The patient's previous oral contraceptives 1 tablet daily.  8. Lasix 20 mg p.o. daily.  9. Lovaza 2 capsules p.o. b.i.d.  10.Zocor 20 mg p.o. at bedtime.  11.Woman's Health 1 a day, multivitamin 1 tab p.o. daily.  12.The patient is to stop taking lisinopril     04/17/2011 f/u ov/Naeemah Jasmer cc    Review of Systems     Objective:   Physical Exam 04/17/2011  253 wt  amb mod obese minimally hoarse bf with minimal swelling of cheeks, not tongue, no stridor  HEENT: nl dentition, turbinates, and orophanx. Nl external ear canals without cough reflex   NECK :  without JVD/Nodes/TM/ nl carotid upstrokes bilaterally   LUNGS: no acc muscle use, clear to A and P bilaterally without cough on insp or exp maneuvers   CV:  RRR  no s3 or murmur or increase in P2, no edema   ABD:  soft and nontender with nl excursion in the supine position. No bruits or organomegaly, bowel sounds nl  MS:  warm without deformities, calf tenderness, cyanosis or clubbing      Assessment:         Plan:

## 2011-04-18 ENCOUNTER — Encounter: Payer: Self-pay | Admitting: Family Medicine

## 2011-04-18 DIAGNOSIS — T783XXA Angioneurotic edema, initial encounter: Secondary | ICD-10-CM | POA: Insufficient documentation

## 2011-04-18 NOTE — Assessment & Plan Note (Signed)
Resolved, given the life threatening nature of this reaction have advised she avoid this class - could consider ARB later but CCB/ diuretic probably better choices here

## 2011-04-18 NOTE — Assessment & Plan Note (Signed)
Adequate control on present rx, reviewed options > she coming off steroids now and will fu/ with Dr Laury Axon but may be ok just on furosemid plus/ min arb

## 2011-04-19 ENCOUNTER — Encounter: Payer: Self-pay | Admitting: Family Medicine

## 2011-04-19 ENCOUNTER — Ambulatory Visit (INDEPENDENT_AMBULATORY_CARE_PROVIDER_SITE_OTHER): Payer: Managed Care, Other (non HMO) | Admitting: Family Medicine

## 2011-04-19 VITALS — BP 142/98 | HR 90 | Temp 99.1°F | Wt 252.8 lb

## 2011-04-19 DIAGNOSIS — R7309 Other abnormal glucose: Secondary | ICD-10-CM

## 2011-04-19 DIAGNOSIS — I10 Essential (primary) hypertension: Secondary | ICD-10-CM

## 2011-04-19 DIAGNOSIS — R739 Hyperglycemia, unspecified: Secondary | ICD-10-CM

## 2011-04-19 DIAGNOSIS — E785 Hyperlipidemia, unspecified: Secondary | ICD-10-CM

## 2011-04-19 MED ORDER — AMLODIPINE BESYLATE 5 MG PO TABS: 5.0000 mg | ORAL_TABLET | Freq: Every day | ORAL | Status: DC

## 2011-04-19 NOTE — Patient Instructions (Signed)

## 2011-04-19 NOTE — Progress Notes (Signed)
  Subjective:    Patient here for follow-up of elevated blood pressure.  She is not exercising and is adherent to a low-salt diet.  Blood pressure not checked at home. Cardiac symptoms: none. Patient denies: chest pain, chest pressure/discomfort, claudication, dyspnea, exertional chest pressure/discomfort, fatigue, irregular heart beat, lower extremity edema, near-syncope, orthopnea, palpitations, paroxysmal nocturnal dyspnea, syncope and tachypnea. Cardiovascular risk factors: hypertension and obesity (BMI >= 30 kg/m2). Use of agents associated with hypertension: none. History of target organ damage: none.  The following portions of the patient's history were reviewed and updated as appropriate: allergies, current medications, past family history, past medical history, past social history, past surgical history and problem list.  Review of Systems Pertinent items are noted in HPI.     Objective:    BP 142/98  Pulse 90  Temp(Src) 99.1 F (37.3 C) (Oral)  Wt 252 lb 12.8 oz (114.669 kg)  SpO2 99% General appearance: alert, cooperative, appears stated age and no distress Neck: no adenopathy, supple, symmetrical, trachea midline and thyroid not enlarged, symmetric, no tenderness/mass/nodules Lungs: clear to auscultation bilaterally Heart: regular rate and rhythm, S1, S2 normal, no murmur, click, rub or gallop Extremities: extremities normal, atraumatic, no cyanosis or edema    Assessment:    Hypertension, stage 1 . Evidence of target organ damage: none.   s/p Angioedema---secondary to ACEI Plan:    Medication: begin norvasc 5. Dietary sodium restriction. Regular aerobic exercise. Check blood pressures 2-3 times weekly and record. Follow up: 3 weeks and as needed.

## 2011-04-22 NOTE — Discharge Summary (Signed)
NAMETERRYL, Michele Tran Tran NO.:  [id number]  MEDICAL RECORD NO.:  [id number]  LOCATION:  1225                         FACILITY:  [facility]  PHYSICIAN:  Charlaine Dalton. Sherene Sires, MD, FCCPDATE OF BIRTH:  01-19-72  DATE OF ADMISSION:  04/09/2011 DATE OF DISCHARGE:  04/12/2011                              DISCHARGE SUMMARY   DISCHARGE DIAGNOSES: 1. Upper respiratory obstruction/angioedema. 2. Hypertension. 3. Hyperglycemia.  HISTORY OF PRESENT ILLNESS:  Michele Tran Tran is a 40 year old African American female with history of hypertension who has been on lisinopril for approximately 1 year, who presented to [facility] on April 09, 2011, with 1-day history of severe facial swelling. Pulmonary Critical Care was called in [facility] emergency room for ICU management of likely angioedema.  LABORATORY DATA:  At Michele Tran time of admission, basic metabolic panel, sodium 137, potassium 4.1, CO2 28, glucose 104, BUN 10, creatinine 0.8. CBC:  White blood cells 19.1, hemoglobin 12.6, hematocrit 40.2, and platelets 426.  INR 1.01.  Urine pregnancy was negative.  Urinalysis: Trace leukocytes, otherwise negative.  OTHER PERTINENT LABORATORY DATA:  C3 complement 183, C4 complement 43. Most recent laboratory data, April 12, 2011, CBC:  White blood cells 18.7, hemoglobin 9.7, hematocrit 31.1, platelets 339.  Basic metabolic panel:  Sodium 139, potassium 3.9, glucose 110, BUN 18, creatinine 0.81.  RADIOLOGY DATA:  No radiology data available this admission.  MICROBIOLOGY DATA:  No micro data available this admission.  HOSPITAL COURSE BY DISCHARGE DIAGNOSES: 1. 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  She has been on lisinopril for     approximately 1 year, and only 50% of cases of angioedema     associated with lisinopril occur after Michele Tran first month of therapy.     She had significant left facial and periorbital swelling,  however,     did not appear to have any tongue, soft palate, or other airway     swelling.  No hoarseness or difficulty swallowing.  It appeared     that her angioedema was limited to Michele Tran face and not involving Michele Tran     airway swelling, improved very quickly with Solu-Medrol, Benadryl,     and Pepcid.  Michele Tran Tran did not require any intubation or     mechanical ventilation.  She was initially treated with IV Solu-     Medrol and has been transitioned to p.o. prednisone with a very     short taper planned.  C3 and C4 were essentially normal.  Trypsin     level was checked and is still pending.  Michele Tran Tran should     continue to list ACE INHIBITORS as significant allergy with     angioedema.  Michele Tran Tran will be discharged home with outpatient     followup from Pulmonary standpoint as well as Primary Care.  Michele Tran     Tran has been instructed to list Ace inhibitors as an allergy.     She has also been instructed to call our office and/or go to [facility]     emergency room if she becomes worse, has difficulty swallowing, or  experiences any further tongue or facial swelling. 2. Hypertension.  Blood pressure has been okay during this admission     off antihypertensives.  We will discharge her off antihypertensive     and let her primary care physician determine further course for     treatment of hypertension in outpatient setting. 3. Hyperglycemia in Michele Tran setting of Solu-Medrol.  Glucose has trended     back down to within normal range with tapering of Solu-Medrol.  DISCHARGE MEDICATIONS: 1. Benadryl 25 mg p.o. q.6 hours x3 more days. 2. Pepcid 20 mg p.o. at bedtime x3 more days. 3. Prednisone 10-mg tabs, Michele Tran Tran is to take 4 tabs daily x2 days,     then 3 tabs daily x1 day, then 2 daily x1 day, then 1 daily x1 day,     and then stop. 4. Advil 200-400 mg p.o. daily as needed for pain. 5. Aspirin 81 mg daily. 6. Female hormones  black cohosh 1 tablet p.o. daily. 7. Michele Tran Tran's  previous oral contraceptives 1 tablet daily. 8. Lasix 20 mg p.o. daily. 9. Lovaza 2 capsules p.o. b.i.d. 10.Zocor 20 mg p.o. at bedtime. 11.Woman's Health 1 a day, multivitamin 1 tab p.o. daily. 12.Michele Tran Tran is to stop taking lisinopril.  DISCHARGE ACTIVITY:  No restrictions.  DISCHARGE DIET:  Low-sodium, heart-healthy diet.  FOLLOWUP APPOINTMENTS: 1. Dr. Sherene Sires on April 17, 2011, at 10:15 a.m. 2. Dr. Laury Axon, her primary care physician, on April 19, 2011, at 9:15     a.m.  DISPOSITION:  Michele Tran Tran has met maximum benefit from her inpatient hospitalization.  She is medically cleared and ready for discharge home. Her angioedema secondary to ACE inhibitor use is resolving and hypertension will continue be followed on an outpatient basis by her primary care physician.  She was discharged home.  Greater than 30 minutes was spent on this discharge.     [name], NP   ______________________________ Charlaine Dalton. Sherene Sires, MD, FCCP    KW/MEDQ  D:  04/12/2011  T:  04/19/2011  Job:  [id number]  cc:   Lelon Perla, DO [address] [place], [place] [address]  Electronically Signed by Sandrea Hughs MD FCCP on 04/22/2011 02:49:30 PM

## 2011-04-25 ENCOUNTER — Encounter: Payer: Self-pay | Admitting: Family Medicine

## 2011-04-25 ENCOUNTER — Ambulatory Visit (INDEPENDENT_AMBULATORY_CARE_PROVIDER_SITE_OTHER): Payer: Managed Care, Other (non HMO) | Admitting: Family Medicine

## 2011-04-25 VITALS — BP 134/82 | HR 108 | Temp 98.4°F | Wt 256.4 lb

## 2011-04-25 DIAGNOSIS — J019 Acute sinusitis, unspecified: Secondary | ICD-10-CM

## 2011-04-25 DIAGNOSIS — J329 Chronic sinusitis, unspecified: Secondary | ICD-10-CM

## 2011-04-25 MED ORDER — FLUTICASONE FUROATE 27.5 MCG/SPRAY NA SUSP: 2.0000 | Freq: Every day | NASAL | Status: DC

## 2011-04-25 MED ORDER — CEFUROXIME AXETIL 500 MG PO TABS: 500.0000 mg | ORAL_TABLET | Freq: Two times a day (BID) | ORAL | Status: AC

## 2011-04-25 NOTE — Progress Notes (Signed)
  Subjective:     Michele Tran is a 40 y.o. female who presents for evaluation of sinus pain. Symptoms include: congestion, facial pain, nasal congestion and sinus pressure. Onset of symptoms was 2 weeks ago. Symptoms have been gradually worsening since that time. Past history is significant for no history of pneumonia or bronchitis. Patient is a non-smoker.  The following portions of the patient's history were reviewed and updated as appropriate: allergies, current medications, past family history, past medical history, past social history, past surgical history and problem list.  Review of Systems Pertinent items are noted in HPI.   Objective:    BP 134/82  Pulse 108  Temp(Src) 98.4 F (36.9 C) (Oral)  Wt 256 lb 6.4 oz (116.302 kg)  SpO2 97% General appearance: alert, cooperative, appears stated age and no distress Ears: normal TM's and external ear canals both ears Nose: green discharge, moderate congestion, sinus tenderness bilateral Neck: mild anterior cervical adenopathy, no carotid bruit, no JVD, supple, symmetrical, trachea midline and thyroid not enlarged, symmetric, no tenderness/mass/nodules Lungs: clear to auscultation bilaterally Heart: regular rate and rhythm, S1, S2 normal, no murmur, click, rub or gallop Lymph nodes: Cervical, supraclavicular, and axillary nodes normal.    Assessment:    Acute bacterial sinusitis.    Plan:    Neti pot recommended. Instructions given. Nasal steroids per medication orders. Ceftin per medication orders. Follow up in several days or as needed.

## 2011-04-25 NOTE — Patient Instructions (Signed)

## 2011-05-08 ENCOUNTER — Encounter: Payer: Self-pay | Admitting: Family Medicine

## 2011-05-08 ENCOUNTER — Ambulatory Visit (INDEPENDENT_AMBULATORY_CARE_PROVIDER_SITE_OTHER): Payer: Managed Care, Other (non HMO) | Admitting: Family Medicine

## 2011-05-08 VITALS — BP 142/90 | HR 93 | Temp 98.9°F | Wt 257.2 lb

## 2011-05-08 DIAGNOSIS — E785 Hyperlipidemia, unspecified: Secondary | ICD-10-CM

## 2011-05-08 DIAGNOSIS — H60399 Other infective otitis externa, unspecified ear: Secondary | ICD-10-CM

## 2011-05-08 DIAGNOSIS — H6090 Unspecified otitis externa, unspecified ear: Secondary | ICD-10-CM

## 2011-05-08 DIAGNOSIS — R739 Hyperglycemia, unspecified: Secondary | ICD-10-CM

## 2011-05-08 DIAGNOSIS — R7309 Other abnormal glucose: Secondary | ICD-10-CM

## 2011-05-08 DIAGNOSIS — I10 Essential (primary) hypertension: Secondary | ICD-10-CM

## 2011-05-08 LAB — HEPATIC FUNCTION PANEL
ALT: 20 U/L (ref 0–35)
AST: 23 U/L (ref 0–37)
Albumin: 3.8 g/dL (ref 3.5–5.2)
Alkaline Phosphatase: 38 U/L — ABNORMAL LOW (ref 39–117)
Bilirubin, Direct: 0 mg/dL (ref 0.0–0.3)
Total Bilirubin: 0.6 mg/dL (ref 0.3–1.2)
Total Protein: 8 g/dL (ref 6.0–8.3)

## 2011-05-08 LAB — LIPID PANEL
Cholesterol: 169 mg/dL (ref 0–200)
HDL: 73.9 mg/dL (ref >39.00)
LDL Cholesterol: 58 mg/dL (ref 0–99)
Total CHOL/HDL Ratio: 2
Triglycerides: 188 mg/dL — ABNORMAL HIGH (ref 0.0–149.0)
VLDL: 37.6 mg/dL (ref 0.0–40.0)

## 2011-05-08 LAB — CBC WITH DIFFERENTIAL/PLATELET
Basophils Absolute: 0 10*3/uL (ref 0.0–0.1)
Basophils Relative: 0.3 % (ref 0.0–3.0)
Eosinophils Absolute: 0.1 10*3/uL (ref 0.0–0.7)
Eosinophils Relative: 1.1 % (ref 0.0–5.0)
HCT: 36.2 % (ref 36.0–46.0)
Hemoglobin: 11.8 g/dL — ABNORMAL LOW (ref 12.0–15.0)
Lymphocytes Relative: 19.1 % (ref 12.0–46.0)
Lymphs Abs: 2.1 10*3/uL (ref 0.7–4.0)
MCHC: 32.5 g/dL (ref 30.0–36.0)
MCV: 81.6 fl (ref 78.0–100.0)
Monocytes Absolute: 0.5 10*3/uL (ref 0.1–1.0)
Monocytes Relative: 4.9 % (ref 3.0–12.0)
Neutro Abs: 8.2 10*3/uL — ABNORMAL HIGH (ref 1.4–7.7)
Neutrophils Relative %: 74.6 % (ref 43.0–77.0)
Platelets: 434 10*3/uL — ABNORMAL HIGH (ref 150.0–400.0)
RBC: 4.44 Mil/uL (ref 3.87–5.11)
RDW: 15.8 % — ABNORMAL HIGH (ref 11.5–14.6)
WBC: 11 10*3/uL — ABNORMAL HIGH (ref 4.5–10.5)

## 2011-05-08 LAB — BASIC METABOLIC PANEL
BUN: 12 mg/dL (ref 6–23)
CO2: 26 mEq/L (ref 19–32)
Calcium: 9.1 mg/dL (ref 8.4–10.5)
Chloride: 102 mEq/L (ref 96–112)
Creatinine, Ser: 0.8 mg/dL (ref 0.4–1.2)
GFR: 110.58 mL/min (ref >60.00)
Glucose, Bld: 93 mg/dL (ref 70–99)
Potassium: 3.9 mEq/L (ref 3.5–5.1)
Sodium: 137 mEq/L (ref 135–145)

## 2011-05-08 LAB — HEMOGLOBIN A1C: Hgb A1c MFr Bld: 6.3 % (ref 4.6–6.5)

## 2011-05-08 MED ORDER — OFLOXACIN 0.3 % OT SOLN: 10.0000 [drp] | Freq: Every day | OTIC | Status: AC

## 2011-05-08 NOTE — Patient Instructions (Signed)
Otitis Externa Otitis externa ("swimmer's ear") is a germ (bacterial) or fungal infection of the outer ear canal (from the eardrum to the outside of the ear). Swimming in dirty water may cause swimmer's ear. It also may be caused by moisture in the ear from water remaining after swimming or bathing. Often the first signs of infection may be itching in the ear canal. This may progress to ear canal swelling, redness, and pus drainage, which may be signs of infection. HOME CARE INSTRUCTIONS   Apply the antibiotic drops to the ear canal as prescribed by your doctor.   This can be a very painful medical condition. A strong pain reliever may be prescribed.   Only take over-the-counter or prescription medicines for pain, discomfort, or fever as directed by your caregiver.   If your caregiver has given you a follow-up appointment, it is very important to keep that appointment. Not keeping the appointment could result in a chronic or permanent injury, pain, hearing loss and disability. If there is any problem keeping the appointment, you must call back to this facility for assistance.  PREVENTION   It is important to keep your ear dry. Use the corner of a towel to wick water out of the ear canal after swimming or bathing.   Avoid scratching in your ear. This can damage the ear canal or remove the protective wax lining the canal and make it easier for germs (bacteria) or a fungus to grow.   You may use ear drops made of rubbing alcohol and vinegar after swimming to prevent future "swimmer's ear" infections. Make up a small bottle of equal parts white vinegar and alcohol. Put 3 or 4 drops into each ear after swimming.   Avoid swimming in lakes, polluted water, or poorly chlorinated pools.  SEEK MEDICAL CARE IF:   An oral temperature above 102 F (38.9 C) develops.   Your ear is still painful after 3 days and shows signs of getting worse (redness, swelling, pain, or pus).  MAKE SURE YOU:   Understand  these instructions.   Will watch your condition.   Will get help right away if you are not doing well or get worse.  Document Released: 06/04/2005 Document Revised: 02/14/2011 Document Reviewed: 01/09/2008 [employer] Patient Information 2012 [employer]. 

## 2011-05-08 NOTE — Progress Notes (Signed)
  Subjective:     Michele Tran is a 40 y.o. female who presents for evaluation of bilateral ear pain. Symptoms have been present for several weeks. She also notes a plugged sensation in the left ear. She does not have a history of ear infections. She does not have a history of recent swimming.  The patient's history has been marked as reviewed and updated as appropriate.   Review of Systems Pertinent items are noted in HPI.   Objective:    BP 142/90  Pulse 93  Temp(Src) 98.9 F (37.2 C) (Oral)  Wt 257 lb 3.2 oz (116.665 kg)  SpO2 98% General:  alert, cooperative and appears stated age  Right Ear: right TM erythematous  Left Ear: left TM erythematous  Mouth:  lips, mucosa, and tongue normal; teeth and gums normal  Neck: no adenopathy, no carotid bruit, no JVD, supple, symmetrical, trachea midline and thyroid not enlarged, symmetric, no tenderness/mass/nodules       Assessment:    Bilateral otitis externa    Plan:    Treatment: Floxin Otic. OTC analgesia as needed. Water exclusion from affected ear until symptoms resolve. Follow up in several days if symptoms not improving.

## 2011-05-18 ENCOUNTER — Other Ambulatory Visit: Payer: Self-pay

## 2011-05-18 MED ORDER — SIMVASTATIN 40 MG PO TABS: 40.0000 mg | ORAL_TABLET | Freq: Every evening | ORAL | Status: DC

## 2011-06-18 ENCOUNTER — Other Ambulatory Visit: Payer: Self-pay | Admitting: Family Medicine

## 2011-09-24 ENCOUNTER — Other Ambulatory Visit: Payer: Self-pay | Admitting: Obstetrics and Gynecology

## 2012-02-17 LAB — HM PAP SMEAR

## 2013-03-25 ENCOUNTER — Telehealth: Payer: Self-pay

## 2013-03-25 NOTE — Telephone Encounter (Signed)
LM for CB  HM reviewed. Due as noted: Flu vaccine MMG?  

## 2013-03-26 ENCOUNTER — Ambulatory Visit (INDEPENDENT_AMBULATORY_CARE_PROVIDER_SITE_OTHER): Payer: Managed Care, Other (non HMO) | Admitting: Family Medicine

## 2013-03-26 ENCOUNTER — Encounter: Payer: Self-pay | Admitting: Family Medicine

## 2013-03-26 VITALS — BP 144/96 | HR 93 | Temp 98.4°F | Ht 66.5 in | Wt 262.6 lb

## 2013-03-26 DIAGNOSIS — I10 Essential (primary) hypertension: Secondary | ICD-10-CM

## 2013-03-26 DIAGNOSIS — E785 Hyperlipidemia, unspecified: Secondary | ICD-10-CM

## 2013-03-26 DIAGNOSIS — Z0000 Encounter for general adult medical examination without abnormal findings: Secondary | ICD-10-CM

## 2013-03-26 LAB — CBC WITH DIFFERENTIAL/PLATELET
Basophils Absolute: 0 10*3/uL (ref 0.0–0.1)
Basophils Relative: 0.4 % (ref 0.0–3.0)
Eosinophils Absolute: 0.3 10*3/uL (ref 0.0–0.7)
Eosinophils Relative: 3.7 % (ref 0.0–5.0)
HCT: 35.4 % — ABNORMAL LOW (ref 36.0–46.0)
Hemoglobin: 11.5 g/dL — ABNORMAL LOW (ref 12.0–15.0)
Lymphocytes Relative: 17.8 % (ref 12.0–46.0)
Lymphs Abs: 1.6 10*3/uL (ref 0.7–4.0)
MCHC: 32.5 g/dL (ref 30.0–36.0)
MCV: 77.2 fl — ABNORMAL LOW (ref 78.0–100.0)
Monocytes Absolute: 0.6 10*3/uL (ref 0.1–1.0)
Monocytes Relative: 6.7 % (ref 3.0–12.0)
Neutro Abs: 6.4 10*3/uL (ref 1.4–7.7)
Neutrophils Relative %: 71.4 % (ref 43.0–77.0)
Platelets: 364 10*3/uL (ref 150.0–400.0)
RBC: 4.58 Mil/uL (ref 3.87–5.11)
RDW: 16.7 % — ABNORMAL HIGH (ref 11.5–14.6)
WBC: 8.9 10*3/uL (ref 4.5–10.5)

## 2013-03-26 LAB — POCT URINALYSIS DIPSTICK
Bilirubin, UA: NEGATIVE
Blood, UA: NEGATIVE
Glucose, UA: NEGATIVE
Ketones, UA: NEGATIVE
Leukocytes, UA: NEGATIVE
Nitrite, UA: NEGATIVE
Protein, UA: NEGATIVE
Spec Grav, UA: 1.025
Urobilinogen, UA: 0.2
pH, UA: 6.5

## 2013-03-26 LAB — LIPID PANEL
Cholesterol: 224 mg/dL — ABNORMAL HIGH (ref 0–200)
HDL: 57.7 mg/dL (ref >39.00)
Total CHOL/HDL Ratio: 4
Triglycerides: 110 mg/dL (ref 0.0–149.0)
VLDL: 22 mg/dL (ref 0.0–40.0)

## 2013-03-26 LAB — BASIC METABOLIC PANEL
BUN: 10 mg/dL (ref 6–23)
CO2: 27 mEq/L (ref 19–32)
Calcium: 9.3 mg/dL (ref 8.4–10.5)
Chloride: 101 mEq/L (ref 96–112)
Creatinine, Ser: 0.7 mg/dL (ref 0.4–1.2)
GFR: 118.61 mL/min (ref >60.00)
Glucose, Bld: 95 mg/dL (ref 70–99)
Potassium: 3.6 mEq/L (ref 3.5–5.1)
Sodium: 138 mEq/L (ref 135–145)

## 2013-03-26 LAB — HEPATIC FUNCTION PANEL
ALT: 20 U/L (ref 0–35)
AST: 25 U/L (ref 0–37)
Albumin: 4.2 g/dL (ref 3.5–5.2)
Alkaline Phosphatase: 52 U/L (ref 39–117)
Bilirubin, Direct: 0 mg/dL (ref 0.0–0.3)
Total Bilirubin: 1.1 mg/dL (ref 0.3–1.2)
Total Protein: 8.7 g/dL — ABNORMAL HIGH (ref 6.0–8.3)

## 2013-03-26 LAB — MICROALBUMIN / CREATININE URINE RATIO
Creatinine,U: 45.2 mg/dL
Microalb Creat Ratio: 2 mg/g (ref 0.0–30.0)
Microalb, Ur: 0.9 mg/dL (ref 0.0–1.9)

## 2013-03-26 LAB — TSH: TSH: 0.54 u[IU]/mL (ref 0.35–5.50)

## 2013-03-26 MED ORDER — CARVEDILOL 6.25 MG PO TABS: 6.2500 mg | ORAL_TABLET | Freq: Two times a day (BID) | ORAL | Status: DC

## 2013-03-26 NOTE — Assessment & Plan Note (Signed)
Pt stopped norvasc because it made her ankles swell Coreg 6.25 mg bid rto  2 weeks

## 2013-03-26 NOTE — Patient Instructions (Signed)
Preventive Care for Adults, Female A healthy lifestyle and preventive care can promote health and wellness. Preventive health guidelines for women include the following key practices.  A routine yearly physical is a good way to check with your caregiver about your health and preventive screening. It is a chance to share any concerns and updates on your health, and to receive a thorough exam.  Visit your dentist for a routine exam and preventive care every 6 months. Brush your teeth twice a day and floss once a day. Good oral hygiene prevents tooth decay and gum disease.  The frequency of eye exams is based on your age, health, family medical history, use of contact lenses, and other factors. Follow your caregiver's recommendations for frequency of eye exams.  Eat a healthy diet. Foods like vegetables, fruits, whole grains, low-fat dairy products, and lean protein foods contain the nutrients you need without too many calories. Decrease your intake of foods high in solid fats, added sugars, and salt. Eat the right amount of calories for you.Get information about a proper diet from your caregiver, if necessary.  Regular physical exercise is one of the most important things you can do for your health. Most adults should get at least 150 minutes of moderate-intensity exercise (any activity that increases your heart rate and causes you to sweat) each week. In addition, most adults need muscle-strengthening exercises on 2 or more days a week.  Maintain a healthy weight. The body mass index (BMI) is a screening tool to identify possible weight problems. It provides an estimate of body fat based on height and weight. Your caregiver can help determine your BMI, and can help you achieve or maintain a healthy weight.For adults 20 years and older:  A BMI below 18.5 is considered underweight.  A BMI of 18.5 to 24.9 is normal.  A BMI of 25 to 29.9 is considered overweight.  A BMI of 30 and above is  considered obese.  Maintain normal blood lipids and cholesterol levels by exercising and minimizing your intake of saturated fat. Eat a balanced diet with plenty of fruit and vegetables. Blood tests for lipids and cholesterol should begin at age 20 and be repeated every 5 years. If your lipid or cholesterol levels are high, you are over 50, or you are at high risk for heart disease, you may need your cholesterol levels checked more frequently.Ongoing high lipid and cholesterol levels should be treated with medicines if diet and exercise are not effective.  If you smoke, find out from your caregiver how to quit. If you do not use tobacco, do not start.  If you are pregnant, do not drink alcohol. If you are breastfeeding, be very cautious about drinking alcohol. If you are not pregnant and choose to drink alcohol, do not exceed 1 drink per day. One drink is considered to be 12 ounces (355 mL) of beer, 5 ounces (148 mL) of wine, or 1.5 ounces (44 mL) of liquor.  Avoid use of street drugs. Do not share needles with anyone. Ask for help if you need support or instructions about stopping the use of drugs.  High blood pressure causes heart disease and increases the risk of stroke. Your blood pressure should be checked at least every 1 to 2 years. Ongoing high blood pressure should be treated with medicines if weight loss and exercise are not effective.  If you are 55 to 41 years old, ask your caregiver if you should take aspirin to prevent strokes.  Diabetes   screening involves taking a blood sample to check your fasting blood sugar level. This should be done once every 3 years, after age 45, if you are within normal weight and without risk factors for diabetes. Testing should be considered at a younger age or be carried out more frequently if you are overweight and have at least 1 risk factor for diabetes.  Breast cancer screening is essential preventive care for women. You should practice "breast  self-awareness." This means understanding the normal appearance and feel of your breasts and may include breast self-examination. Any changes detected, no matter how small, should be reported to a caregiver. Women in their 20s and 30s should have a clinical breast exam (CBE) by a caregiver as part of a regular health exam every 1 to 3 years. After age 40, women should have a CBE every year. Starting at age 40, women should consider having a mammography (breast X-ray test) every year. Women who have a family history of breast cancer should talk to their caregiver about genetic screening. Women at a high risk of breast cancer should talk to their caregivers about having magnetic resonance imaging (MRI) and a mammography every year.  The Pap test is a screening test for cervical cancer. A Pap test can show cell changes on the cervix that might become cervical cancer if left untreated. A Pap test is a procedure in which cells are obtained and examined from the lower end of the uterus (cervix).  Women should have a Pap test starting at age 21.  Between ages 21 and 29, Pap tests should be repeated every 2 years.  Beginning at age 30, you should have a Pap test every 3 years as long as the past 3 Pap tests have been normal.  Some women have medical problems that increase the chance of getting cervical cancer. Talk to your caregiver about these problems. It is especially important to talk to your caregiver if a new problem develops soon after your last Pap test. In these cases, your caregiver may recommend more frequent screening and Pap tests.  The above recommendations are the same for women who have or have not gotten the vaccine for human papillomavirus (HPV).  If you had a hysterectomy for a problem that was not cancer or a condition that could lead to cancer, then you no longer need Pap tests. Even if you no longer need a Pap test, a regular exam is a good idea to make sure no other problems are  starting.  If you are between ages 65 and 70, and you have had normal Pap tests going back 10 years, you no longer need Pap tests. Even if you no longer need a Pap test, a regular exam is a good idea to make sure no other problems are starting.  If you have had past treatment for cervical cancer or a condition that could lead to cancer, you need Pap tests and screening for cancer for at least 20 years after your treatment.  If Pap tests have been discontinued, risk factors (such as a new sexual partner) need to be reassessed to determine if screening should be resumed.  The HPV test is an additional test that may be used for cervical cancer screening. The HPV test looks for the virus that can cause the cell changes on the cervix. The cells collected during the Pap test can be tested for HPV. The HPV test could be used to screen women aged 30 years and older, and should   be used in women of any age who have unclear Pap test results. After the age of 30, women should have HPV testing at the same frequency as a Pap test.  Colorectal cancer can be detected and often prevented. Most routine colorectal cancer screening begins at the age of 50 and continues through age 75. However, your caregiver may recommend screening at an earlier age if you have risk factors for colon cancer. On a yearly basis, your caregiver may provide home test kits to check for hidden blood in the stool. Use of a small camera at the end of a tube, to directly examine the colon (sigmoidoscopy or colonoscopy), can detect the earliest forms of colorectal cancer. Talk to your caregiver about this at age 50, when routine screening begins. Direct examination of the colon should be repeated every 5 to 10 years through age 75, unless early forms of pre-cancerous polyps or small growths are found.  Hepatitis C blood testing is recommended for all people born from 1945 through 1965 and any individual with known risks for hepatitis C.  Practice  safe sex. Use condoms and avoid high-risk sexual practices to reduce the spread of sexually transmitted infections (STIs). STIs include gonorrhea, chlamydia, syphilis, trichomonas, herpes, HPV, and human immunodeficiency virus (HIV). Herpes, HIV, and HPV are viral illnesses that have no cure. They can result in disability, cancer, and death. Sexually active women aged 25 and younger should be checked for chlamydia. Older women with new or multiple partners should also be tested for chlamydia. Testing for other STIs is recommended if you are sexually active and at increased risk.  Osteoporosis is a disease in which the bones lose minerals and strength with aging. This can result in serious bone fractures. The risk of osteoporosis can be identified using a bone density scan. Women ages 65 and over and women at risk for fractures or osteoporosis should discuss screening with their caregivers. Ask your caregiver whether you should take a calcium supplement or vitamin D to reduce the rate of osteoporosis.  Menopause can be associated with physical symptoms and risks. Hormone replacement therapy is available to decrease symptoms and risks. You should talk to your caregiver about whether hormone replacement therapy is right for you.  Use sunscreen with sun protection factor (SPF) of 30 or more. Apply sunscreen liberally and repeatedly throughout the day. You should seek shade when your shadow is shorter than you. Protect yourself by wearing long sleeves, pants, a wide-brimmed hat, and sunglasses year round, whenever you are outdoors.  Once a month, do a whole body skin exam, using a mirror to look at the skin on your back. Notify your caregiver of new moles, moles that have irregular borders, moles that are larger than a pencil eraser, or moles that have changed in shape or color.  Stay current with required immunizations.  Influenza. You need a dose every fall (or winter). The composition of the flu vaccine  changes each year, so being vaccinated once is not enough.  Pneumococcal polysaccharide. You need 1 to 2 doses if you smoke cigarettes or if you have certain chronic medical conditions. You need 1 dose at age 65 (or older) if you have never been vaccinated.  Tetanus, diphtheria, pertussis (Tdap, Td). Get 1 dose of Tdap vaccine if you are younger than age 65, are over 65 and have contact with an infant, are a healthcare worker, are pregnant, or simply want to be protected from whooping cough. After that, you need a Td   booster dose every 10 years. Consult your caregiver if you have not had at least 3 tetanus and diphtheria-containing shots sometime in your life or have a deep or dirty wound.  HPV. You need this vaccine if you are a woman age 26 or younger. The vaccine is given in 3 doses over 6 months.  Measles, mumps, rubella (MMR). You need at least 1 dose of MMR if you were born in 1957 or later. You may also need a second dose.  Meningococcal. If you are age 19 to 21 and a first-year college student living in a residence hall, or have one of several medical conditions, you need to get vaccinated against meningococcal disease. You may also need additional booster doses.  Zoster (shingles). If you are age 60 or older, you should get this vaccine.  Varicella (chickenpox). If you have never had chickenpox or you were vaccinated but received only 1 dose, talk to your caregiver to find out if you need this vaccine.  Hepatitis A. You need this vaccine if you have a specific risk factor for hepatitis A virus infection or you simply wish to be protected from this disease. The vaccine is usually given as 2 doses, 6 to 18 months apart.  Hepatitis B. You need this vaccine if you have a specific risk factor for hepatitis B virus infection or you simply wish to be protected from this disease. The vaccine is given in 3 doses, usually over 6 months. Preventive Services / Frequency Ages 19 to 39  Blood  pressure check.** / Every 1 to 2 years.  Lipid and cholesterol check.** / Every 5 years beginning at age 20.  Clinical breast exam.** / Every 3 years for women in their 20s and 30s.  Pap test.** / Every 2 years from ages 21 through 29. Every 3 years starting at age 30 through age 65 or 70 with a history of 3 consecutive normal Pap tests.  HPV screening.** / Every 3 years from ages 30 through ages 65 to 70 with a history of 3 consecutive normal Pap tests.  Hepatitis C blood test.** / For any individual with known risks for hepatitis C.  Skin self-exam. / Monthly.  Influenza immunization.** / Every year.  Pneumococcal polysaccharide immunization.** / 1 to 2 doses if you smoke cigarettes or if you have certain chronic medical conditions.  Tetanus, diphtheria, pertussis (Tdap, Td) immunization. / A one-time dose of Tdap vaccine. After that, you need a Td booster dose every 10 years.  HPV immunization. / 3 doses over 6 months, if you are 26 and younger.  Measles, mumps, rubella (MMR) immunization. / You need at least 1 dose of MMR if you were born in 1957 or later. You may also need a second dose.  Meningococcal immunization. / 1 dose if you are age 19 to 21 and a first-year college student living in a residence hall, or have one of several medical conditions, you need to get vaccinated against meningococcal disease. You may also need additional booster doses.  Varicella immunization.** / Consult your caregiver.  Hepatitis A immunization.** / Consult your caregiver. 2 doses, 6 to 18 months apart.  Hepatitis B immunization.** / Consult your caregiver. 3 doses usually over 6 months. Ages 40 to 64  Blood pressure check.** / Every 1 to 2 years.  Lipid and cholesterol check.** / Every 5 years beginning at age 20.  Clinical breast exam.** / Every year after age 40.  Mammogram.** / Every year beginning at age 40   and continuing for as long as you are in good health. Consult with your  caregiver.  Pap test.** / Every 3 years starting at age 30 through age 65 or 70 with a history of 3 consecutive normal Pap tests.  HPV screening.** / Every 3 years from ages 30 through ages 65 to 70 with a history of 3 consecutive normal Pap tests.  Fecal occult blood test (FOBT) of stool. / Every year beginning at age 50 and continuing until age 75. You may not need to do this test if you get a colonoscopy every 10 years.  Flexible sigmoidoscopy or colonoscopy.** / Every 5 years for a flexible sigmoidoscopy or every 10 years for a colonoscopy beginning at age 50 and continuing until age 75.  Hepatitis C blood test.** / For all people born from 1945 through 1965 and any individual with known risks for hepatitis C.  Skin self-exam. / Monthly.  Influenza immunization.** / Every year.  Pneumococcal polysaccharide immunization.** / 1 to 2 doses if you smoke cigarettes or if you have certain chronic medical conditions.  Tetanus, diphtheria, pertussis (Tdap, Td) immunization.** / A one-time dose of Tdap vaccine. After that, you need a Td booster dose every 10 years.  Measles, mumps, rubella (MMR) immunization. / You need at least 1 dose of MMR if you were born in 1957 or later. You may also need a second dose.  Varicella immunization.** / Consult your caregiver.  Meningococcal immunization.** / Consult your caregiver.  Hepatitis A immunization.** / Consult your caregiver. 2 doses, 6 to 18 months apart.  Hepatitis B immunization.** / Consult your caregiver. 3 doses, usually over 6 months. Ages 65 and over  Blood pressure check.** / Every 1 to 2 years.  Lipid and cholesterol check.** / Every 5 years beginning at age 20.  Clinical breast exam.** / Every year after age 40.  Mammogram.** / Every year beginning at age 40 and continuing for as long as you are in good health. Consult with your caregiver.  Pap test.** / Every 3 years starting at age 30 through age 65 or 70 with a 3  consecutive normal Pap tests. Testing can be stopped between 65 and 70 with 3 consecutive normal Pap tests and no abnormal Pap or HPV tests in the past 10 years.  HPV screening.** / Every 3 years from ages 30 through ages 65 or 70 with a history of 3 consecutive normal Pap tests. Testing can be stopped between 65 and 70 with 3 consecutive normal Pap tests and no abnormal Pap or HPV tests in the past 10 years.  Fecal occult blood test (FOBT) of stool. / Every year beginning at age 50 and continuing until age 75. You may not need to do this test if you get a colonoscopy every 10 years.  Flexible sigmoidoscopy or colonoscopy.** / Every 5 years for a flexible sigmoidoscopy or every 10 years for a colonoscopy beginning at age 50 and continuing until age 75.  Hepatitis C blood test.** / For all people born from 1945 through 1965 and any individual with known risks for hepatitis C.  Osteoporosis screening.** / A one-time screening for women ages 65 and over and women at risk for fractures or osteoporosis.  Skin self-exam. / Monthly.  Influenza immunization.** / Every year.  Pneumococcal polysaccharide immunization.** / 1 dose at age 65 (or older) if you have never been vaccinated.  Tetanus, diphtheria, pertussis (Tdap, Td) immunization. / A one-time dose of Tdap vaccine if you are over   65 and have contact with an infant, are a healthcare worker, or simply want to be protected from whooping cough. After that, you need a Td booster dose every 10 years.  Varicella immunization.** / Consult your caregiver.  Meningococcal immunization.** / Consult your caregiver.  Hepatitis A immunization.** / Consult your caregiver. 2 doses, 6 to 18 months apart.  Hepatitis B immunization.** / Check with your caregiver. 3 doses, usually over 6 months. ** Family history and personal history of risk and conditions may change your caregiver's recommendations. Document Released: 07/31/2001 Document Revised: 08/27/2011  Document Reviewed: 10/30/2010 [employer] Patient Information 2014 [employer].  

## 2013-03-26 NOTE — Assessment & Plan Note (Signed)
Check labs 

## 2013-03-26 NOTE — Progress Notes (Signed)
Subjective:     Michele Tran is a 42 y.o. female and is here for a comprehensive physical exam. The patient reports no problems.  History   Social History  . Marital Status: Single    Spouse Name: N/A    Number of Children: N/A  . Years of Education: N/A   Occupational History  . [employer]    Social History Main Topics  . Smoking status: Never Smoker   . Smokeless tobacco: Never Used  . Alcohol Use: No  . Drug Use: No  . Sexual Activity: Yes    Partners: Male   Other Topics Concern  . Not on file   Social History Narrative   Exercise-- walking    Health Maintenance  Topic Date Due  . Influenza Vaccine  03/26/2014  . Pap Smear  02/17/2015  . Tetanus/tdap  04/04/2020    The following portions of the patient's history were reviewed and updated as appropriate:  She  has a past medical history of Hyperlipidemia; Hypertension; Obesity, morbid; Abdominal bloating; Hirsutism; and Polycystic ovaries. She  does not have any pertinent problems on file. She  has past surgical history that includes UTERINE polyps (05-31-10). Her family history includes Arthritis in an other family member; Breast cancer in her paternal aunt; Cancer in her paternal aunt; Cancer (age of onset: 73) in her father; Coronary artery disease in her paternal uncle; Diabetes in an other family member; Hyperlipidemia in an other family member; Hypertension in an other family member; Leukemia in her paternal uncle; Lung cancer in her paternal aunt; Stroke (age of onset: 9) in her mother. She  reports that she has never smoked. She has never used smokeless tobacco. She reports that she does not drink alcohol or use illicit drugs. She has a current medication list which includes the following prescription(s): amlodipine, carvedilol, and simvastatin. Current Outpatient Prescriptions on File Prior to Visit  Medication Sig Dispense Refill  . amLODipine (NORVASC) 5 MG tablet Take 1 tablet (5 mg total) by  mouth daily.  30 tablet  11  . simvastatin (ZOCOR) 40 MG tablet Take 1 tablet (40 mg total) by mouth every evening.  30 tablet  2   No current facility-administered medications on file prior to visit.   She is allergic to ace inhibitors; codeine; and fluviral..  Review of Systems Review of Systems  Constitutional: Negative for activity change, appetite change and fatigue.  HENT: Negative for hearing loss, congestion, tinnitus and ear discharge.  dentist q34m Eyes: Negative for visual disturbance (see optho q1y -- vision corrected to 20/20 with glasses).  Respiratory: Negative for cough, chest tightness and shortness of breath.   Cardiovascular: Negative for chest pain, palpitations and leg swelling.  Gastrointestinal: Negative for abdominal pain, diarrhea, constipation and abdominal distention.  Genitourinary: Negative for urgency, frequency, decreased urine volume and difficulty urinating.  Musculoskeletal: Negative for back pain, arthralgias and gait problem.  Skin: Negative for color change, pallor and rash.  Neurological: Negative for dizziness, light-headedness, numbness and headaches.  Hematological: Negative for adenopathy. Does not bruise/bleed easily.  Psychiatric/Behavioral: Negative for suicidal ideas, confusion, sleep disturbance, self-injury, dysphoric mood, decreased concentration and agitation.       Objective:    BP 144/96  Pulse 93  Temp(Src) 98.4 F (36.9 C) (Oral)  Ht 5' 6.5" (1.689 m)  Wt 262 lb 9.6 oz (119.115 kg)  BMI 41.75 kg/m2  SpO2 98% General appearance: alert, cooperative, appears stated age and no distress Head: Normocephalic, without obvious abnormality,  atraumatic Eyes: conjunctivae/corneas clear. PERRL, EOM's intact. Fundi benign. Ears: normal TM's and external ear canals both ears Nose: Nares normal. Septum midline. Mucosa normal. No drainage or sinus tenderness. Throat: lips, mucosa, and tongue normal; teeth and gums normal Neck: no  adenopathy, no carotid bruit, no JVD, supple, symmetrical, trachea midline and thyroid not enlarged, symmetric, no tenderness/mass/nodules Back: symmetric, no curvature. ROM normal. No CVA tenderness. Lungs: clear to auscultation bilaterally Breasts: normal appearance, no masses or tenderness Heart: regular rate and rhythm, S1, S2 normal, no murmur, click, rub or gallop Abdomen: soft, non-tender; bowel sounds normal; no masses,  no organomegaly Pelvic: deferred-- gyn Extremities: extremities normal, atraumatic, no cyanosis or edema Pulses: 2+ and symmetric Skin: Skin color, texture, turgor normal. No rashes or lesions Lymph nodes: Cervical, supraclavicular, and axillary nodes normal. Neurologic: Alert and oriented X 3, normal strength and tone. Normal symmetric reflexes. Normal coordination and gait Psych-- no depression, no anxiety      Assessment:    Healthy female exam.      Plan:    ghm utd Check labs See After Visit Summary for Counseling Recommendations

## 2013-03-26 NOTE — Telephone Encounter (Signed)
Medication and allergies: done  Pharmacy updated, uses  for 90 day supply Pharmacy updated, uses for local pharmacy  HM UTD: No Immunizations due: offered flu vaccine  A/P: PAP:    gyn    Last: MMG: due Last: needs to scheudle Dexa: n/q Last: CCS: n/a DM: due HTN: due Lipids: due  To Discuss with Provider:

## 2013-03-26 NOTE — Telephone Encounter (Signed)
Patient uses [facility] for all medications. Has never used mail order  Patient states that she is currently taking no medications.  Would not indicate why.

## 2013-03-27 LAB — LDL CHOLESTEROL, DIRECT: Direct LDL: 149.6 mg/dL

## 2013-04-02 MED ORDER — ATORVASTATIN CALCIUM 20 MG PO TABS: 20.0000 mg | ORAL_TABLET | Freq: Every day | ORAL | Status: DC

## 2013-04-16 ENCOUNTER — Ambulatory Visit (INDEPENDENT_AMBULATORY_CARE_PROVIDER_SITE_OTHER): Payer: Managed Care, Other (non HMO) | Admitting: Family Medicine

## 2013-04-16 ENCOUNTER — Encounter: Payer: Self-pay | Admitting: Family Medicine

## 2013-04-16 VITALS — BP 144/100 | HR 87 | Temp 98.5°F | Wt 266.0 lb

## 2013-04-16 DIAGNOSIS — I10 Essential (primary) hypertension: Secondary | ICD-10-CM

## 2013-04-16 MED ORDER — HYDROCHLOROTHIAZIDE 25 MG PO TABS: 25.0000 mg | ORAL_TABLET | Freq: Every day | ORAL | Status: DC

## 2013-04-16 MED ORDER — CARVEDILOL 12.5 MG PO TABS: 12.5000 mg | ORAL_TABLET | Freq: Two times a day (BID) | ORAL | Status: DC

## 2013-04-16 NOTE — Progress Notes (Signed)
  Subjective:    Patient here for follow-up of elevated blood pressure.  She is not exercising and is adherent to a low-salt diet.  Blood pressure is not well controlled at home. Cardiac symptoms: none. Patient denies: chest pain, chest pressure/discomfort, claudication, dyspnea, exertional chest pressure/discomfort, irregular heart beat, lower extremity edema, near-syncope, orthopnea, palpitations, paroxysmal nocturnal dyspnea, syncope and tachypnea. Cardiovascular risk factors: dyslipidemia, hypertension, obesity (BMI >= 30 kg/m2) and sedentary lifestyle. Use of agents associated with hypertension: none. History of target organ damage: none.  The following portions of the patient's history were reviewed and updated as appropriate: allergies, current medications, past family history, past medical history, past social history, past surgical history and problem list.  Review of Systems Pertinent items are noted in HPI.     Objective:    BP 144/100  Pulse 87  Temp(Src) 98.5 F (36.9 C) (Oral)  Wt 266 lb (120.657 kg)  BMI 42.3 kg/m2  SpO2 97% General appearance: alert, cooperative, appears stated age and no distress Neck: no adenopathy, supple, symmetrical, trachea midline and thyroid not enlarged, symmetric, no tenderness/mass/nodules Lungs: clear to auscultation bilaterally Heart: S1, S2 normal Extremities: edema tr pitting edema b/l low ext    Assessment:    Hypertension, uncontrolled . Evidence of target organ damage: none.    Plan:    Medication: begin hctz 25 mg  and increase to coreg 12.5 bid. Dietary sodium restriction. Regular aerobic exercise. Check blood pressures 2-3 times weekly and record. Follow up: 3 weeks and as needed.  Pt instructed to take hctz in am and eat a banana or drink oj daily

## 2013-04-16 NOTE — Patient Instructions (Signed)

## 2013-05-07 ENCOUNTER — Ambulatory Visit (INDEPENDENT_AMBULATORY_CARE_PROVIDER_SITE_OTHER): Payer: Managed Care, Other (non HMO) | Admitting: Family Medicine

## 2013-05-07 ENCOUNTER — Encounter: Payer: Self-pay | Admitting: Family Medicine

## 2013-05-07 VITALS — BP 132/88 | HR 87 | Temp 98.5°F | Wt 264.0 lb

## 2013-05-07 DIAGNOSIS — R609 Edema, unspecified: Secondary | ICD-10-CM

## 2013-05-07 DIAGNOSIS — I10 Essential (primary) hypertension: Secondary | ICD-10-CM

## 2013-05-07 LAB — BASIC METABOLIC PANEL
BUN: 14 mg/dL (ref 6–23)
CO2: 29 mEq/L (ref 19–32)
Calcium: 9.7 mg/dL (ref 8.4–10.5)
Chloride: 96 mEq/L (ref 96–112)
Creatinine, Ser: 0.8 mg/dL (ref 0.4–1.2)
GFR: 100.17 mL/min (ref >60.00)
Glucose, Bld: 112 mg/dL — ABNORMAL HIGH (ref 70–99)
Potassium: 3.5 mEq/L (ref 3.5–5.1)
Sodium: 134 mEq/L — ABNORMAL LOW (ref 135–145)

## 2013-05-07 MED ORDER — CARVEDILOL 25 MG PO TABS: 25.0000 mg | ORAL_TABLET | Freq: Two times a day (BID) | ORAL | Status: DC

## 2013-05-07 MED ORDER — FUROSEMIDE 20 MG PO TABS: 20.0000 mg | ORAL_TABLET | Freq: Every day | ORAL | Status: DC

## 2013-05-07 NOTE — Patient Instructions (Signed)

## 2013-05-07 NOTE — Progress Notes (Signed)
Pre visit review using our clinic review tool, if applicable. No additional management support is needed unless otherwise documented below in the visit note. 

## 2013-05-07 NOTE — Progress Notes (Signed)
  Subjective:    Patient here for follow-up of elevated blood pressure.  She is exercising and is adherent to a low-salt diet.  Blood pressure is not well controlled at home. Cardiac symptoms: none. Patient denies: chest pain, chest pressure/discomfort, claudication, dyspnea, exertional chest pressure/discomfort, fatigue, irregular heart beat, lower extremity edema, near-syncope, orthopnea, palpitations, paroxysmal nocturnal dyspnea, syncope and tachypnea. Cardiovascular risk factors: dyslipidemia, hypertension and obesity (BMI >= 30 kg/m2). Use of agents associated with hypertension: none. History of target organ damage: none.  The following portions of the patient's history were reviewed and updated as appropriate: allergies, current medications, past family history, past medical history, past social history, past surgical history and problem list.  Review of Systems Pertinent items are noted in HPI.     Objective:    BP 132/88  Pulse 87  Temp(Src) 98.5 F (36.9 C) (Oral)  Wt 264 lb (119.75 kg)  SpO2 98% General appearance: alert, cooperative, appears stated age and no distress Lungs: clear to auscultation bilaterally Heart: S1, S2 normal Extremities: extremities normal, atraumatic, no cyanosis or edema    Assessment:    Hypertension, stage 1 uncontrolled. Evidence of target organ damage: none.    Plan:    Medication: increase to coreg 25 mg bid. Check blood pressures 2-3 times weekly and record. Follow up: 3 months and as needed.

## 2013-06-17 ENCOUNTER — Other Ambulatory Visit: Payer: Self-pay | Admitting: Family Medicine

## 2013-07-14 ENCOUNTER — Other Ambulatory Visit: Payer: Self-pay | Admitting: Family Medicine

## 2013-09-18 ENCOUNTER — Other Ambulatory Visit: Payer: Self-pay | Admitting: Family Medicine

## 2013-10-24 ENCOUNTER — Other Ambulatory Visit: Payer: Self-pay | Admitting: Family Medicine

## 2013-11-03 ENCOUNTER — Other Ambulatory Visit: Payer: Self-pay

## 2013-11-03 MED ORDER — CARVEDILOL 25 MG PO TABS: ORAL_TABLET | ORAL | Status: DC

## 2013-11-03 MED ORDER — FUROSEMIDE 20 MG PO TABS: ORAL_TABLET | ORAL | Status: DC

## 2013-11-03 MED ORDER — ATORVASTATIN CALCIUM 20 MG PO TABS: ORAL_TABLET | ORAL | Status: DC

## 2014-01-01 ENCOUNTER — Ambulatory Visit (INDEPENDENT_AMBULATORY_CARE_PROVIDER_SITE_OTHER): Payer: BC Managed Care – PPO | Admitting: Internal Medicine

## 2014-01-01 ENCOUNTER — Telehealth: Payer: Self-pay | Admitting: Family Medicine

## 2014-01-01 ENCOUNTER — Encounter: Payer: Self-pay | Admitting: Internal Medicine

## 2014-01-01 VITALS — BP 139/85 | HR 83 | Temp 97.8°F | Wt 269.0 lb

## 2014-01-01 DIAGNOSIS — T7840XA Allergy, unspecified, initial encounter: Secondary | ICD-10-CM

## 2014-01-01 MED ORDER — PREDNISONE 10 MG PO TABS: 10.0000 mg | ORAL_TABLET | Freq: Every day | ORAL | Status: DC

## 2014-01-01 NOTE — Progress Notes (Signed)
Pre visit review using our clinic review tool, if applicable. No additional management support is needed unless otherwise documented below in the visit note. 

## 2014-01-01 NOTE — Telephone Encounter (Signed)
Encounter closed due to pt having an appt with paz.

## 2014-01-01 NOTE — Telephone Encounter (Signed)
Patient Information:  Caller Name: Shearon Baloavia  Phone: [phone]  Patient: Michele Tran, Michele Tran  Gender: Female  DOB: 01/25/1971  Age: 43 Years  PCP: Lelon PerlaLowne, Yvonne R.  Pregnant: No  Office Follow Up:  Does the office need to follow up with this patient?: No  Instructions For The Office: N/A   Symptoms  Reason For Call & Symptoms: PT calling regarding swelling of eyes bilaterally after trying a new makeup on 12/31/13. Pt washed it off  after about 3 hrs and took Benadryl and used icepacke that did help but still having swelling. Today still having swelling and itchy. Able to see fine; no problem with vision at all. NO other sx of anaphylaxis-cough, sob.  Reviewed Health History In EMR: Yes  Reviewed Medications In EMR: Yes  Reviewed Allergies In EMR: Yes  Reviewed Surgeries / Procedures: Yes  Date of Onset of Symptoms: 12/31/2013  Treatments Tried: Benadryl and icepack.  Treatments Tried Worked: No OB / GYN:  LMP: 12/21/2013  Guideline(s) Used:  Eye Injury  Disposition Per Guideline:   See Today in Office  Reason For Disposition Reached:   Patient wants to be seen  Advice Given:  N/A  Patient Will Follow Care Advice:  YES  Appointment Scheduled:  01/01/2014 14:45:00 Appointment Scheduled Provider:  Willow OraPaz, Jose

## 2014-01-01 NOTE — Progress Notes (Signed)
   Subjective:    Patient ID: Michele Tran, female    DOB: [birth year], 43 y.o.   MRN: [id number]  DOS:  01/01/2014 Type of visit - description: acute History: Got  new makeup yesterday, put it on, 2 hours later developed itching and swelling on the places she used it. Benadryl and ice pack helped. Today the swelling has come back but is not as severe. Also, she's taking amoxicillin for the last week without apparent problems.  ROS Denies difficulty with her breathing or swallowing. vision is normal. No eyeball  symptoms  Past Medical History  Diagnosis Date  . Hyperlipidemia   . Hypertension   . Obesity, morbid   . Abdominal bloating   . Hirsutism   . Polycystic ovaries     Past Surgical History  Procedure Laterality Date  . Uterine polyps  05-31-10    D &C  [facility]    History   Social History  . Marital Status: Single    Spouse Name: N/A    Number of Children: N/A  . Years of Education: N/A   Occupational History  . [employer]    Social History Main Topics  . Smoking status: Never Smoker   . Smokeless tobacco: Never Used  . Alcohol Use: No  . Drug Use: No  . Sexual Activity: Yes    Partners: Male   Other Topics Concern  . Not on file   Social History Narrative   Exercise-- walking         Medication List       This list is accurate as of: 01/01/14 11:59 PM.  Always use your most recent med list.               amoxicillin 500 MG tablet  Commonly known as:  AMOXIL  Take 500 mg by mouth 2 (two) times daily.     atorvastatin 20 MG tablet  Commonly known as:  LIPITOR  1 tab by mouth daily-- Labs are due now     carvedilol 25 MG tablet  Commonly known as:  COREG  take 1 tablet by mouth twice a day WITH A MEAL     fluocinonide cream 0.05 %  Commonly known as:  LIDEX  APPLY TO AFFECTED AREA 2 TIMES A DAY AS NEEDED     furosemide 20 MG tablet  Commonly known as:  LASIX  take 1 tablet by mouth once daily     predniSONE 10 MG tablet   Commonly known as:  DELTASONE  Take 1 tablet (10 mg total) by mouth daily. 2 tabs a day x 5 days           Objective:   Physical Exam  Constitutional: She appears well-developed and well-nourished. No distress.  HENT:  Head:    Skin: She is not diaphoretic.  Psychiatric: She has a normal mood and affect. Her behavior is normal. Judgment and thought content normal.   BP 139/85  Pulse 83  Temp(Src) 97.8 F (36.6 C)  Wt 269 lb (122.018 kg)  SpO2 100%         Assessment & Plan:   Allergic reaction,  Localized allergic reaction to makeup Plan:  Avoidance, benadryl, ice, low dose of prednisone. Recommend to check blood sugar daily while on steroids, see instructions Will call if sx resurface/increase

## 2014-01-01 NOTE — Patient Instructions (Signed)
Benadryl OTC as needed Prednisone for a few days, see prescription Icepack as needed Check your blood sugars daily while on prednisone, if your blood sugar ever goes more than 180,  stop prednisone and let us know

## 2014-02-19 ENCOUNTER — Encounter: Payer: Self-pay | Admitting: Family Medicine

## 2014-02-19 ENCOUNTER — Ambulatory Visit (INDEPENDENT_AMBULATORY_CARE_PROVIDER_SITE_OTHER): Payer: BC Managed Care – PPO | Admitting: Family Medicine

## 2014-02-19 VITALS — BP 130/90 | HR 74 | Temp 98.1°F | Wt 273.1 lb

## 2014-02-19 DIAGNOSIS — I10 Essential (primary) hypertension: Secondary | ICD-10-CM

## 2014-02-19 DIAGNOSIS — R82998 Other abnormal findings in urine: Secondary | ICD-10-CM

## 2014-02-19 DIAGNOSIS — E785 Hyperlipidemia, unspecified: Secondary | ICD-10-CM

## 2014-02-19 DIAGNOSIS — R609 Edema, unspecified: Secondary | ICD-10-CM

## 2014-02-19 DIAGNOSIS — R8290 Unspecified abnormal findings in urine: Secondary | ICD-10-CM

## 2014-02-19 LAB — POCT URINALYSIS DIPSTICK
Bilirubin, UA: NEGATIVE
Blood, UA: NEGATIVE
Glucose, UA: NEGATIVE
Ketones, UA: NEGATIVE
Nitrite, UA: NEGATIVE
Spec Grav, UA: 1.025
Urobilinogen, UA: 0.2
pH, UA: 5

## 2014-02-19 LAB — BASIC METABOLIC PANEL
BUN: 8 mg/dL (ref 6–23)
CO2: 31 mEq/L (ref 19–32)
Calcium: 8.9 mg/dL (ref 8.4–10.5)
Chloride: 101 mEq/L (ref 96–112)
Creatinine, Ser: 0.7 mg/dL (ref 0.4–1.2)
GFR: 126.38 mL/min (ref >60.00)
Glucose, Bld: 115 mg/dL — ABNORMAL HIGH (ref 70–99)
Potassium: 3.6 mEq/L (ref 3.5–5.1)
Sodium: 136 mEq/L (ref 135–145)

## 2014-02-19 LAB — HEPATIC FUNCTION PANEL
ALT: 21 U/L (ref 0–35)
AST: 23 U/L (ref 0–37)
Albumin: 3.8 g/dL (ref 3.5–5.2)
Alkaline Phosphatase: 57 U/L (ref 39–117)
Bilirubin, Direct: 0.2 mg/dL (ref 0.0–0.3)
Total Bilirubin: 1.3 mg/dL — ABNORMAL HIGH (ref 0.2–1.2)
Total Protein: 7.9 g/dL (ref 6.0–8.3)

## 2014-02-19 LAB — LIPID PANEL
Cholesterol: 129 mg/dL (ref 0–200)
HDL: 48.8 mg/dL (ref >39.00)
LDL Cholesterol: 61 mg/dL (ref 0–99)
NonHDL: 80.2
Total CHOL/HDL Ratio: 3
Triglycerides: 95 mg/dL (ref 0.0–149.0)
VLDL: 19 mg/dL (ref 0.0–40.0)

## 2014-02-19 MED ORDER — AMLODIPINE BESYLATE 5 MG PO TABS: 5.0000 mg | ORAL_TABLET | Freq: Every day | ORAL | Status: DC

## 2014-02-19 MED ORDER — FUROSEMIDE 20 MG PO TABS: ORAL_TABLET | ORAL | Status: DC

## 2014-02-19 MED ORDER — CARVEDILOL 25 MG PO TABS: ORAL_TABLET | ORAL | Status: DC

## 2014-02-19 NOTE — Addendum Note (Signed)
Addended by: Silvio Pate D on: 02/19/2014 01:28 PM   Modules accepted: Orders

## 2014-02-19 NOTE — Patient Instructions (Addendum)

## 2014-02-19 NOTE — Progress Notes (Signed)
  Subjective:    Patient here for follow-up of elevated blood pressure.  She is not exercising and is adherent to a low-salt diet.  Blood pressure is not well controlled at home. Cardiac symptoms: none. Patient denies: chest pain, chest pressure/discomfort, claudication, dyspnea, exertional chest pressure/discomfort, fatigue, irregular heart beat, lower extremity edema, near-syncope, orthopnea, palpitations, paroxysmal nocturnal dyspnea, syncope and tachypnea. Cardiovascular risk factors: dyslipidemia, hypertension, obesity (BMI >= 30 kg/m2) and sedentary lifestyle. Use of agents associated with hypertension: none. History of target organ damage: none.  The following portions of the patient's history were reviewed and updated as appropriate: allergies, current medications, past family history, past medical history, past social history, past surgical history and problem list.  Review of Systems Pertinent items are noted in HPI.     Objective:    BP 130/90  Pulse 74  Temp(Src) 98.1 F (36.7 C) (Oral)  Wt 273 lb 2.4 oz (123.9 kg)  SpO2 98% General appearance: alert, cooperative, appears stated age and no distress Head: Normocephalic, without obvious abnormality, atraumatic Throat: lips, mucosa, and tongue normal; teeth and gums normal Neck: no adenopathy, supple, symmetrical, trachea midline and thyroid not enlarged, symmetric, no tenderness/mass/nodules Lungs: clear to auscultation bilaterally Heart: S1, S2 normal Extremities: extremities normal, atraumatic, no cyanosis or edema    Assessment:    Hypertension, uncontrolled . Evidence of target organ damage: none.    Plan:    Medication: begin norvasc with coreg. Regular aerobic exercise. Check blood pressures 2-3 times weekly and record. Follow up: 3 months and as needed.   1. Essential hypertension - amLODipine (NORVASC) 5 MG tablet; Take 1 tablet (5 mg total) by mouth daily.  Dispense: 90 tablet; Refill: 3 - carvedilol (COREG)  25 MG tablet; take 1 tablet by mouth twice a day WITH A MEAL  Dispense: 180 tablet; Refill: 3 - furosemide (LASIX) 20 MG tablet; take 1 tablet by mouth once daily  Dispense: 90 tablet; Refill: 3 - Basic metabolic panel - Hepatic function panel - Lipid panel - POCT urinalysis dipstick  2. Other and unspecified hyperlipidemia Check labs - Basic metabolic panel - Lipid panel - POCT urinalysis dipstick  3. Edema Elevated legs-- con't diuretic - Basic metabolic panel - Hepatic function panel - Lipid panel - POCT urinalysis dipstick

## 2014-02-19 NOTE — Progress Notes (Signed)
Pre visit review using our clinic review tool, if applicable. No additional management support is needed unless otherwise documented below in the visit note. 

## 2014-02-20 LAB — URINE CULTURE: Colony Count: 6000

## 2014-03-23 ENCOUNTER — Other Ambulatory Visit: Payer: Self-pay | Admitting: Family Medicine

## 2014-07-13 ENCOUNTER — Encounter: Payer: Self-pay | Admitting: Family Medicine

## 2014-07-13 ENCOUNTER — Ambulatory Visit (INDEPENDENT_AMBULATORY_CARE_PROVIDER_SITE_OTHER): Payer: BLUE CROSS/BLUE SHIELD | Admitting: Family Medicine

## 2014-07-13 VITALS — BP 120/80 | HR 68 | Temp 98.0°F | Ht 67.0 in | Wt 272.4 lb

## 2014-07-13 DIAGNOSIS — I10 Essential (primary) hypertension: Secondary | ICD-10-CM

## 2014-07-13 DIAGNOSIS — E785 Hyperlipidemia, unspecified: Secondary | ICD-10-CM

## 2014-07-13 DIAGNOSIS — Z0000 Encounter for general adult medical examination without abnormal findings: Secondary | ICD-10-CM

## 2014-07-13 LAB — POCT URINALYSIS DIPSTICK
Bilirubin, UA: NEGATIVE
Blood, UA: NEGATIVE
Glucose, UA: NEGATIVE
Ketones, UA: NEGATIVE
Leukocytes, UA: NEGATIVE
Nitrite, UA: NEGATIVE
Protein, UA: NEGATIVE
Spec Grav, UA: 1.025
Urobilinogen, UA: NEGATIVE
pH, UA: 6

## 2014-07-13 LAB — HEPATIC FUNCTION PANEL
ALT: 17 U/L (ref 0–35)
AST: 18 U/L (ref 0–37)
Albumin: 4.1 g/dL (ref 3.5–5.2)
Alkaline Phosphatase: 63 U/L (ref 39–117)
Bilirubin, Direct: 0.2 mg/dL (ref 0.0–0.3)
Total Bilirubin: 0.9 mg/dL (ref 0.2–1.2)
Total Protein: 8.2 g/dL (ref 6.0–8.3)

## 2014-07-13 LAB — LIPID PANEL
Cholesterol: 140 mg/dL (ref 0–200)
HDL: 54.5 mg/dL (ref >39.00)
LDL Cholesterol: 64 mg/dL (ref 0–99)
NonHDL: 85.5
Total CHOL/HDL Ratio: 3
Triglycerides: 108 mg/dL (ref 0.0–149.0)
VLDL: 21.6 mg/dL (ref 0.0–40.0)

## 2014-07-13 LAB — CBC WITH DIFFERENTIAL/PLATELET
Basophils Absolute: 0 10*3/uL (ref 0.0–0.1)
Basophils Relative: 0.4 % (ref 0.0–3.0)
Eosinophils Absolute: 0.2 10*3/uL (ref 0.0–0.7)
Eosinophils Relative: 2.3 % (ref 0.0–5.0)
HCT: 34.8 % — ABNORMAL LOW (ref 36.0–46.0)
Hemoglobin: 11.5 g/dL — ABNORMAL LOW (ref 12.0–15.0)
Lymphocytes Relative: 24 % (ref 12.0–46.0)
Lymphs Abs: 1.8 10*3/uL (ref 0.7–4.0)
MCHC: 32.9 g/dL (ref 30.0–36.0)
MCV: 79.5 fl (ref 78.0–100.0)
Monocytes Absolute: 0.5 10*3/uL (ref 0.1–1.0)
Monocytes Relative: 7.3 % (ref 3.0–12.0)
Neutro Abs: 4.9 10*3/uL (ref 1.4–7.7)
Neutrophils Relative %: 66 % (ref 43.0–77.0)
Platelets: 319 10*3/uL (ref 150.0–400.0)
RBC: 4.38 Mil/uL (ref 3.87–5.11)
RDW: 15.3 % (ref 11.5–15.5)
WBC: 7.4 10*3/uL (ref 4.0–10.5)

## 2014-07-13 LAB — BASIC METABOLIC PANEL
BUN: 11 mg/dL (ref 6–23)
CO2: 28 mEq/L (ref 19–32)
Calcium: 9.5 mg/dL (ref 8.4–10.5)
Chloride: 102 mEq/L (ref 96–112)
Creatinine, Ser: 0.7 mg/dL (ref 0.40–1.20)
GFR: 117.86 mL/min (ref >60.00)
Glucose, Bld: 108 mg/dL — ABNORMAL HIGH (ref 70–99)
Potassium: 3.9 mEq/L (ref 3.5–5.1)
Sodium: 135 mEq/L (ref 135–145)

## 2014-07-13 LAB — TSH: TSH: 0.74 u[IU]/mL (ref 0.35–4.50)

## 2014-07-13 NOTE — Progress Notes (Signed)
Pre visit review using our clinic review tool, if applicable. No additional management support is needed unless otherwise documented below in the visit note. 

## 2014-07-13 NOTE — Progress Notes (Signed)
Subjective:     Michele Tran is a 44 y.o. female and is here for a comprehensive physical exam. The patient reports no problems.  History   Social History  . Marital Status: Single    Spouse Name: N/A    Number of Children: N/A  . Years of Education: N/A   Occupational History  . [employer]    Social History Main Topics  . Smoking status: Never Smoker   . Smokeless tobacco: Never Used  . Alcohol Use: No  . Drug Use: No  . Sexual Activity:    Partners: Male   Other Topics Concern  . Not on file   Social History Narrative   Exercise-- walking    Health Maintenance  Topic Date Due  . INFLUENZA VACCINE  02/20/2015 (Originally 01/16/2014)  . PAP SMEAR  02/17/2015  . TETANUS/TDAP  04/04/2020    The following portions of the patient's history were reviewed and updated as appropriate:  She  has a past medical history of Hyperlipidemia; Hypertension; Obesity, morbid; Abdominal bloating; Hirsutism; and Polycystic ovaries. She  does not have any pertinent problems on file. She  has past surgical history that includes UTERINE polyps (05-31-10). Her family history includes Arthritis in an other family member; Breast cancer in her paternal aunt; Cancer in her paternal aunt; Cancer (age of onset: 55) in her father; Coronary artery disease in her paternal uncle; Diabetes in an other family member; Hyperlipidemia in an other family member; Hypertension in an other family member; Leukemia in her paternal uncle; Lung cancer in her paternal aunt; Stroke (age of onset: 70) in her mother. She  reports that she has never smoked. She has never used smokeless tobacco. She reports that she does not drink alcohol or use illicit drugs. She has a current medication list which includes the following prescription(s): amlodipine, atorvastatin, carvedilol, fluocinonide cream, and furosemide. Current Outpatient Prescriptions on File Prior to Visit  Medication Sig Dispense Refill  . amLODipine  (NORVASC) 5 MG tablet Take 1 tablet (5 mg total) by mouth daily. 90 tablet 3  . atorvastatin (LIPITOR) 20 MG tablet Take 1 tablet (20 mg total) by mouth daily. 90 tablet 1  . carvedilol (COREG) 25 MG tablet take 1 tablet by mouth twice a day WITH A MEAL 180 tablet 3  . fluocinonide cream (LIDEX) 0.05 % APPLY TO AFFECTED AREA 2 TIMES A DAY AS NEEDED 60 g 2  . furosemide (LASIX) 20 MG tablet take 1 tablet by mouth once daily 90 tablet 3   No current facility-administered medications on file prior to visit.   She is allergic to ace inhibitors; codeine; and fluviral..  Review of Systems Review of Systems  Constitutional: Negative for activity change, appetite change and fatigue.  HENT: Negative for hearing loss, congestion, tinnitus and ear discharge.  dentist q47m Eyes: Negative for visual disturbance (see optho- due) Respiratory: Negative for cough, chest tightness and shortness of breath.   Cardiovascular: Negative for chest pain, palpitations and leg swelling.  Gastrointestinal: Negative for abdominal pain, diarrhea, constipation and abdominal distention.  Genitourinary: Negative for urgency, frequency, decreased urine volume and difficulty urinating.  Musculoskeletal: Negative for back pain, arthralgias and gait problem.  Skin: Negative for color change, pallor and rash.  Neurological: Negative for dizziness, light-headedness, numbness and headaches.  Hematological: Negative for adenopathy. Does not bruise/bleed easily.  Psychiatric/Behavioral: Negative for suicidal ideas, confusion, sleep disturbance, self-injury, dysphoric mood, decreased concentration and agitation.      Objective:  BP 120/80 mmHg  Pulse 68  Temp(Src) 98 F (36.7 C) (Oral)  Ht 5\' 7"  (1.702 m)  Wt 272 lb 6.4 oz (123.56 kg)  BMI 42.65 kg/m2  SpO2 98% General appearance: alert, cooperative, appears stated age and no distress Head: Normocephalic, without obvious abnormality, atraumatic Eyes:  conjunctivae/corneas clear. PERRL, EOM's intact. Fundi benign. Ears: normal TM's and external ear canals both ears Nose: Nares normal. Septum midline. Mucosa normal. No drainage or sinus tenderness. Throat: lips, mucosa, and tongue normal; teeth and gums normal Neck: no adenopathy, no carotid bruit, no JVD, supple, symmetrical, trachea midline and thyroid not enlarged, symmetric, no tenderness/mass/nodules Back: symmetric, no curvature. ROM normal. No CVA tenderness. Lungs: clear to auscultation bilaterally Breasts: gyn Heart: regular rate and rhythm, S1, S2 normal, no murmur, click, rub or gallop Abdomen: soft, non-tender; bowel sounds normal; no masses,  no organomegaly Pelvic: deferred--gyn Extremities: extremities normal, atraumatic, no cyanosis or edema Pulses: 2+ and symmetric Skin: Skin color, texture, turgor normal. No rashes or lesions Lymph nodes: Cervical, supraclavicular, and axillary nodes normal. Neurologic: Alert and oriented X 3, normal strength and tone. Normal symmetric reflexes. Normal coordination and gait    Assessment:    Healthy female exam.      Plan:    ghm utd Check labs See After Visit Summary for Counseling Recommendations    1. Preventative health care  - Basic metabolic panel - CBC with Differential/Platelet - Hepatic function panel - Lipid panel - POCT urinalysis dipstick - Microalbumin / creatinine urine ratio - TSH  2. Essential hypertension Stable, cont coreg and norvasc - Basic metabolic panel - CBC with Differential/Platelet - POCT urinalysis dipstick - Microalbumin / creatinine urine ratio - TSH  3. Hyperlipidemia Cont lipitor - Hepatic function panel - Lipid panel - Microalbumin / creatinine urine ratio - TSH

## 2014-07-13 NOTE — Patient Instructions (Signed)
Preventive Care for Adults A healthy lifestyle and preventive care can promote health and wellness. Preventive health guidelines for women include the following key practices.  A routine yearly physical is a good way to check with your health care provider about your health and preventive screening. It is a chance to share any concerns and updates on your health and to receive a thorough exam.  Visit your dentist for a routine exam and preventive care every 6 months. Brush your teeth twice a day and floss once a day. Good oral hygiene prevents tooth decay and gum disease.  The frequency of eye exams is based on your age, health, family medical history, use of contact lenses, and other factors. Follow your health care provider's recommendations for frequency of eye exams.  Eat a healthy diet. Foods like vegetables, fruits, whole grains, low-fat dairy products, and lean protein foods contain the nutrients you need without too many calories. Decrease your intake of foods high in solid fats, added sugars, and salt. Eat the right amount of calories for you.Get information about a proper diet from your health care provider, if necessary.  Regular physical exercise is one of the most important things you can do for your health. Most adults should get at least 150 minutes of moderate-intensity exercise (any activity that increases your heart rate and causes you to sweat) each week. In addition, most adults need muscle-strengthening exercises on 2 or more days a week.  Maintain a healthy weight. The body mass index (BMI) is a screening tool to identify possible weight problems. It provides an estimate of body fat based on height and weight. Your health care provider can find your BMI and can help you achieve or maintain a healthy weight.For adults 20 years and older:  A BMI below 18.5 is considered underweight.  A BMI of 18.5 to 24.9 is normal.  A BMI of 25 to 29.9 is considered overweight.  A BMI of  30 and above is considered obese.  Maintain normal blood lipids and cholesterol levels by exercising and minimizing your intake of saturated fat. Eat a balanced diet with plenty of fruit and vegetables. Blood tests for lipids and cholesterol should begin at age 76 and be repeated every 5 years. If your lipid or cholesterol levels are high, you are over 50, or you are at high risk for heart disease, you may need your cholesterol levels checked more frequently.Ongoing high lipid and cholesterol levels should be treated with medicines if diet and exercise are not working.  If you smoke, find out from your health care provider how to quit. If you do not use tobacco, do not start.  Lung cancer screening is recommended for adults aged 22-80 years who are at high risk for developing lung cancer because of a history of smoking. A yearly low-dose CT scan of the lungs is recommended for people who have at least a 30-pack-year history of smoking and are a current smoker or have quit within the past 15 years. A pack year of smoking is smoking an average of 1 pack of cigarettes a day for 1 year (for example: 1 pack a day for 30 years or 2 packs a day for 15 years). Yearly screening should continue until the smoker has stopped smoking for at least 15 years. Yearly screening should be stopped for people who develop a health problem that would prevent them from having lung cancer treatment.  If you are pregnant, do not drink alcohol. If you are breastfeeding,  be very cautious about drinking alcohol. If you are not pregnant and choose to drink alcohol, do not have more than 1 drink per day. One drink is considered to be 12 ounces (355 mL) of beer, 5 ounces (148 mL) of wine, or 1.5 ounces (44 mL) of liquor.  Avoid use of street drugs. Do not share needles with anyone. Ask for help if you need support or instructions about stopping the use of drugs.  High blood pressure causes heart disease and increases the risk of  stroke. Your blood pressure should be checked at least every 1 to 2 years. Ongoing high blood pressure should be treated with medicines if weight loss and exercise do not work.  If you are 75-52 years old, ask your health care provider if you should take aspirin to prevent strokes.  Diabetes screening involves taking a blood sample to check your fasting blood sugar level. This should be done once every 3 years, after age 15, if you are within normal weight and without risk factors for diabetes. Testing should be considered at a younger age or be carried out more frequently if you are overweight and have at least 1 risk factor for diabetes.  Breast cancer screening is essential preventive care for women. You should practice "breast self-awareness." This means understanding the normal appearance and feel of your breasts and may include breast self-examination. Any changes detected, no matter how small, should be reported to a health care provider. Women in their 58s and 30s should have a clinical breast exam (CBE) by a health care provider as part of a regular health exam every 1 to 3 years. After age 16, women should have a CBE every year. Starting at age 53, women should consider having a mammogram (breast X-ray test) every year. Women who have a family history of breast cancer should talk to their health care provider about genetic screening. Women at a high risk of breast cancer should talk to their health care providers about having an MRI and a mammogram every year.  Breast cancer gene (BRCA)-related cancer risk assessment is recommended for women who have family members with BRCA-related cancers. BRCA-related cancers include breast, ovarian, tubal, and peritoneal cancers. Having family members with these cancers may be associated with an increased risk for harmful changes (mutations) in the breast cancer genes BRCA1 and BRCA2. Results of the assessment will determine the need for genetic counseling and  BRCA1 and BRCA2 testing.  Routine pelvic exams to screen for cancer are no longer recommended for nonpregnant women who are considered low risk for cancer of the pelvic organs (ovaries, uterus, and vagina) and who do not have symptoms. Ask your health care provider if a screening pelvic exam is right for you.  If you have had past treatment for cervical cancer or a condition that could lead to cancer, you need Pap tests and screening for cancer for at least 20 years after your treatment. If Pap tests have been discontinued, your risk factors (such as having a new sexual partner) need to be reassessed to determine if screening should be resumed. Some women have medical problems that increase the chance of getting cervical cancer. In these cases, your health care provider may recommend more frequent screening and Pap tests.  The HPV test is an additional test that may be used for cervical cancer screening. The HPV test looks for the virus that can cause the cell changes on the cervix. The cells collected during the Pap test can be  tested for HPV. The HPV test could be used to screen women aged 30 years and older, and should be used in women of any age who have unclear Pap test results. After the age of 30, women should have HPV testing at the same frequency as a Pap test.  Colorectal cancer can be detected and often prevented. Most routine colorectal cancer screening begins at the age of 50 years and continues through age 75 years. However, your health care provider may recommend screening at an earlier age if you have risk factors for colon cancer. On a yearly basis, your health care provider may provide home test kits to check for hidden blood in the stool. Use of a small camera at the end of a tube, to directly examine the colon (sigmoidoscopy or colonoscopy), can detect the earliest forms of colorectal cancer. Talk to your health care provider about this at age 50, when routine screening begins. Direct  exam of the colon should be repeated every 5-10 years through age 75 years, unless early forms of pre-cancerous polyps or small growths are found.  People who are at an increased risk for hepatitis B should be screened for this virus. You are considered at high risk for hepatitis B if:  You were born in a country where hepatitis B occurs often. Talk with your health care provider about which countries are considered high risk.  Your parents were born in a high-risk country and you have not received a shot to protect against hepatitis B (hepatitis B vaccine).  You have HIV or AIDS.  You use needles to inject street drugs.  You live with, or have sex with, someone who has hepatitis B.  You get hemodialysis treatment.  You take certain medicines for conditions like cancer, organ transplantation, and autoimmune conditions.  Hepatitis C blood testing is recommended for all people born from 1945 through 1965 and any individual with known risks for hepatitis C.  Practice safe sex. Use condoms and avoid high-risk sexual practices to reduce the spread of sexually transmitted infections (STIs). STIs include gonorrhea, chlamydia, syphilis, trichomonas, herpes, HPV, and human immunodeficiency virus (HIV). Herpes, HIV, and HPV are viral illnesses that have no cure. They can result in disability, cancer, and death.  You should be screened for sexually transmitted illnesses (STIs) including gonorrhea and chlamydia if:  You are sexually active and are younger than 24 years.  You are older than 24 years and your health care provider tells you that you are at risk for this type of infection.  Your sexual activity has changed since you were last screened and you are at an increased risk for chlamydia or gonorrhea. Ask your health care provider if you are at risk.  If you are at risk of being infected with HIV, it is recommended that you take a prescription medicine daily to prevent HIV infection. This is  called preexposure prophylaxis (PrEP). You are considered at risk if:  You are a heterosexual woman, are sexually active, and are at increased risk for HIV infection.  You take drugs by injection.  You are sexually active with a partner who has HIV.  Talk with your health care provider about whether you are at high risk of being infected with HIV. If you choose to begin PrEP, you should first be tested for HIV. You should then be tested every 3 months for as long as you are taking PrEP.  Osteoporosis is a disease in which the bones lose minerals and strength   with aging. This can result in serious bone fractures or breaks. The risk of osteoporosis can be identified using a bone density scan. Women ages 65 years and over and women at risk for fractures or osteoporosis should discuss screening with their health care providers. Ask your health care provider whether you should take a calcium supplement or vitamin D to reduce the rate of osteoporosis.  Menopause can be associated with physical symptoms and risks. Hormone replacement therapy is available to decrease symptoms and risks. You should talk to your health care provider about whether hormone replacement therapy is right for you.  Use sunscreen. Apply sunscreen liberally and repeatedly throughout the day. You should seek shade when your shadow is shorter than you. Protect yourself by wearing long sleeves, pants, a wide-brimmed hat, and sunglasses year round, whenever you are outdoors.  Once a month, do a whole body skin exam, using a mirror to look at the skin on your back. Tell your health care provider of new moles, moles that have irregular borders, moles that are larger than a pencil eraser, or moles that have changed in shape or color.  Stay current with required vaccines (immunizations).  Influenza vaccine. All adults should be immunized every year.  Tetanus, diphtheria, and acellular pertussis (Td, Tdap) vaccine. Pregnant women should  receive 1 dose of Tdap vaccine during each pregnancy. The dose should be obtained regardless of the length of time since the last dose. Immunization is preferred during the 27th-36th week of gestation. An adult who has not previously received Tdap or who does not know her vaccine status should receive 1 dose of Tdap. This initial dose should be followed by tetanus and diphtheria toxoids (Td) booster doses every 10 years. Adults with an unknown or incomplete history of completing a 3-dose immunization series with Td-containing vaccines should begin or complete a primary immunization series including a Tdap dose. Adults should receive a Td booster every 10 years.  Varicella vaccine. An adult without evidence of immunity to varicella should receive 2 doses or a second dose if she has previously received 1 dose. Pregnant females who do not have evidence of immunity should receive the first dose after pregnancy. This first dose should be obtained before leaving the health care facility. The second dose should be obtained 4-8 weeks after the first dose.  Human papillomavirus (HPV) vaccine. Females aged 13-26 years who have not received the vaccine previously should obtain the 3-dose series. The vaccine is not recommended for use in pregnant females. However, pregnancy testing is not needed before receiving a dose. If a female is found to be pregnant after receiving a dose, no treatment is needed. In that case, the remaining doses should be delayed until after the pregnancy. Immunization is recommended for any person with an immunocompromised condition through the age of 26 years if she did not get any or all doses earlier. During the 3-dose series, the second dose should be obtained 4-8 weeks after the first dose. The third dose should be obtained 24 weeks after the first dose and 16 weeks after the second dose.  Zoster vaccine. One dose is recommended for adults aged 60 years or older unless certain conditions are  present.  Measles, mumps, and rubella (MMR) vaccine. Adults born before 1957 generally are considered immune to measles and mumps. Adults born in 1957 or later should have 1 or more doses of MMR vaccine unless there is a contraindication to the vaccine or there is laboratory evidence of immunity to   each of the three diseases. A routine second dose of MMR vaccine should be obtained at least 28 days after the first dose for students attending postsecondary schools, health care workers, or international travelers. People who received inactivated measles vaccine or an unknown type of measles vaccine during 1963-1967 should receive 2 doses of MMR vaccine. People who received inactivated mumps vaccine or an unknown type of mumps vaccine before 1979 and are at high risk for mumps infection should consider immunization with 2 doses of MMR vaccine. For females of childbearing age, rubella immunity should be determined. If there is no evidence of immunity, females who are not pregnant should be vaccinated. If there is no evidence of immunity, females who are pregnant should delay immunization until after pregnancy. Unvaccinated health care workers born before 1957 who lack laboratory evidence of measles, mumps, or rubella immunity or laboratory confirmation of disease should consider measles and mumps immunization with 2 doses of MMR vaccine or rubella immunization with 1 dose of MMR vaccine.  Pneumococcal 13-valent conjugate (PCV13) vaccine. When indicated, a person who is uncertain of her immunization history and has no record of immunization should receive the PCV13 vaccine. An adult aged 19 years or older who has certain medical conditions and has not been previously immunized should receive 1 dose of PCV13 vaccine. This PCV13 should be followed with a dose of pneumococcal polysaccharide (PPSV23) vaccine. The PPSV23 vaccine dose should be obtained at least 8 weeks after the dose of PCV13 vaccine. An adult aged 19  years or older who has certain medical conditions and previously received 1 or more doses of PPSV23 vaccine should receive 1 dose of PCV13. The PCV13 vaccine dose should be obtained 1 or more years after the last PPSV23 vaccine dose.  Pneumococcal polysaccharide (PPSV23) vaccine. When PCV13 is also indicated, PCV13 should be obtained first. All adults aged 65 years and older should be immunized. An adult younger than age 65 years who has certain medical conditions should be immunized. Any person who resides in a nursing home or long-term care facility should be immunized. An adult smoker should be immunized. People with an immunocompromised condition and certain other conditions should receive both PCV13 and PPSV23 vaccines. People with human immunodeficiency virus (HIV) infection should be immunized as soon as possible after diagnosis. Immunization during chemotherapy or radiation therapy should be avoided. Routine use of PPSV23 vaccine is not recommended for American Indians, [place] Natives, or people younger than 65 years unless there are medical conditions that require PPSV23 vaccine. When indicated, people who have unknown immunization and have no record of immunization should receive PPSV23 vaccine. One-time revaccination 5 years after the first dose of PPSV23 is recommended for people aged 19-64 years who have chronic kidney failure, nephrotic syndrome, asplenia, or immunocompromised conditions. People who received 1-2 doses of PPSV23 before age 65 years should receive another dose of PPSV23 vaccine at age 65 years or later if at least 5 years have passed since the previous dose. Doses of PPSV23 are not needed for people immunized with PPSV23 at or after age 65 years.  Meningococcal vaccine. Adults with asplenia or persistent complement component deficiencies should receive 2 doses of quadrivalent meningococcal conjugate (MenACWY-D) vaccine. The doses should be obtained at least 2 months apart.  Microbiologists working with certain meningococcal bacteria, military recruits, people at risk during an outbreak, and people who travel to or live in countries with a high rate of meningitis should be immunized. A first-year college student up through age   21 years who is living in a residence hall should receive a dose if she did not receive a dose on or after her 16th birthday. Adults who have certain high-risk conditions should receive one or more doses of vaccine.  Hepatitis A vaccine. Adults who wish to be protected from this disease, have certain high-risk conditions, work with hepatitis A-infected animals, work in hepatitis A research labs, or travel to or work in countries with a high rate of hepatitis A should be immunized. Adults who were previously unvaccinated and who anticipate close contact with an international adoptee during the first 60 days after arrival in the [place] from a country with a high rate of hepatitis A should be immunized.  Hepatitis B vaccine. Adults who wish to be protected from this disease, have certain high-risk conditions, may be exposed to blood or other infectious body fluids, are household contacts or sex partners of hepatitis B positive people, are clients or workers in certain care facilities, or travel to or work in countries with a high rate of hepatitis B should be immunized.  Haemophilus influenzae type b (Hib) vaccine. A previously unvaccinated person with asplenia or sickle cell disease or having a scheduled splenectomy should receive 1 dose of Hib vaccine. Regardless of previous immunization, a recipient of a hematopoietic stem cell transplant should receive a 3-dose series 6-12 months after her successful transplant. Hib vaccine is not recommended for adults with HIV infection. Preventive Services / Frequency Ages 64 to 68 years  Blood pressure check.** / Every 1 to 2 years.  Lipid and cholesterol check.** / Every 5 years beginning at age  22.  Clinical breast exam.** / Every 3 years for women in their 88s and 53s.  BRCA-related cancer risk assessment.** / For women who have family members with a BRCA-related cancer (breast, ovarian, tubal, or peritoneal cancers).  Pap test.** / Every 2 years from ages 90 through 51. Every 3 years starting at age 21 through age 56 or 3 with a history of 3 consecutive normal Pap tests.  HPV screening.** / Every 3 years from ages 24 through ages 1 to 46 with a history of 3 consecutive normal Pap tests.  Hepatitis C blood test.** / For any individual with known risks for hepatitis C.  Skin self-exam. / Monthly.  Influenza vaccine. / Every year.  Tetanus, diphtheria, and acellular pertussis (Tdap, Td) vaccine.** / Consult your health care provider. Pregnant women should receive 1 dose of Tdap vaccine during each pregnancy. 1 dose of Td every 10 years.  Varicella vaccine.** / Consult your health care provider. Pregnant females who do not have evidence of immunity should receive the first dose after pregnancy.  HPV vaccine. / 3 doses over 6 months, if 72 and younger. The vaccine is not recommended for use in pregnant females. However, pregnancy testing is not needed before receiving a dose.  Measles, mumps, rubella (MMR) vaccine.** / You need at least 1 dose of MMR if you were born in 1957 or later. You may also need a 2nd dose. For females of childbearing age, rubella immunity should be determined. If there is no evidence of immunity, females who are not pregnant should be vaccinated. If there is no evidence of immunity, females who are pregnant should delay immunization until after pregnancy.  Pneumococcal 13-valent conjugate (PCV13) vaccine.** / Consult your health care provider.  Pneumococcal polysaccharide (PPSV23) vaccine.** / 1 to 2 doses if you smoke cigarettes or if you have certain conditions.  Meningococcal vaccine.** /  1 dose if you are age 19 to 21 years and a first-year college  student living in a residence hall, or have one of several medical conditions, you need to get vaccinated against meningococcal disease. You may also need additional booster doses.  Hepatitis A vaccine.** / Consult your health care provider.  Hepatitis B vaccine.** / Consult your health care provider.  Haemophilus influenzae type b (Hib) vaccine.** / Consult your health care provider. Ages 40 to 64 years  Blood pressure check.** / Every 1 to 2 years.  Lipid and cholesterol check.** / Every 5 years beginning at age 20 years.  Lung cancer screening. / Every year if you are aged 55-80 years and have a 30-pack-year history of smoking and currently smoke or have quit within the past 15 years. Yearly screening is stopped once you have quit smoking for at least 15 years or develop a health problem that would prevent you from having lung cancer treatment.  Clinical breast exam.** / Every year after age 40 years.  BRCA-related cancer risk assessment.** / For women who have family members with a BRCA-related cancer (breast, ovarian, tubal, or peritoneal cancers).  Mammogram.** / Every year beginning at age 40 years and continuing for as long as you are in good health. Consult with your health care provider.  Pap test.** / Every 3 years starting at age 30 years through age 65 or 70 years with a history of 3 consecutive normal Pap tests.  HPV screening.** / Every 3 years from ages 30 years through ages 65 to 70 years with a history of 3 consecutive normal Pap tests.  Fecal occult blood test (FOBT) of stool. / Every year beginning at age 50 years and continuing until age 75 years. You may not need to do this test if you get a colonoscopy every 10 years.  Flexible sigmoidoscopy or colonoscopy.** / Every 5 years for a flexible sigmoidoscopy or every 10 years for a colonoscopy beginning at age 50 years and continuing until age 75 years.  Hepatitis C blood test.** / For all people born from 1945 through  1965 and any individual with known risks for hepatitis C.  Skin self-exam. / Monthly.  Influenza vaccine. / Every year.  Tetanus, diphtheria, and acellular pertussis (Tdap/Td) vaccine.** / Consult your health care provider. Pregnant women should receive 1 dose of Tdap vaccine during each pregnancy. 1 dose of Td every 10 years.  Varicella vaccine.** / Consult your health care provider. Pregnant females who do not have evidence of immunity should receive the first dose after pregnancy.  Zoster vaccine.** / 1 dose for adults aged 60 years or older.  Measles, mumps, rubella (MMR) vaccine.** / You need at least 1 dose of MMR if you were born in 1957 or later. You may also need a 2nd dose. For females of childbearing age, rubella immunity should be determined. If there is no evidence of immunity, females who are not pregnant should be vaccinated. If there is no evidence of immunity, females who are pregnant should delay immunization until after pregnancy.  Pneumococcal 13-valent conjugate (PCV13) vaccine.** / Consult your health care provider.  Pneumococcal polysaccharide (PPSV23) vaccine.** / 1 to 2 doses if you smoke cigarettes or if you have certain conditions.  Meningococcal vaccine.** / Consult your health care provider.  Hepatitis A vaccine.** / Consult your health care provider.  Hepatitis B vaccine.** / Consult your health care provider.  Haemophilus influenzae type b (Hib) vaccine.** / Consult your health care provider. Ages 65   years and over  Blood pressure check.** / Every 1 to 2 years.  Lipid and cholesterol check.** / Every 5 years beginning at age 22 years.  Lung cancer screening. / Every year if you are aged 73-80 years and have a 30-pack-year history of smoking and currently smoke or have quit within the past 15 years. Yearly screening is stopped once you have quit smoking for at least 15 years or develop a health problem that would prevent you from having lung cancer  treatment.  Clinical breast exam.** / Every year after age 4 years.  BRCA-related cancer risk assessment.** / For women who have family members with a BRCA-related cancer (breast, ovarian, tubal, or peritoneal cancers).  Mammogram.** / Every year beginning at age 40 years and continuing for as long as you are in good health. Consult with your health care provider.  Pap test.** / Every 3 years starting at age 9 years through age 34 or 91 years with 3 consecutive normal Pap tests. Testing can be stopped between 65 and 70 years with 3 consecutive normal Pap tests and no abnormal Pap or HPV tests in the past 10 years.  HPV screening.** / Every 3 years from ages 57 years through ages 64 or 45 years with a history of 3 consecutive normal Pap tests. Testing can be stopped between 65 and 70 years with 3 consecutive normal Pap tests and no abnormal Pap or HPV tests in the past 10 years.  Fecal occult blood test (FOBT) of stool. / Every year beginning at age 15 years and continuing until age 17 years. You may not need to do this test if you get a colonoscopy every 10 years.  Flexible sigmoidoscopy or colonoscopy.** / Every 5 years for a flexible sigmoidoscopy or every 10 years for a colonoscopy beginning at age 86 years and continuing until age 71 years.  Hepatitis C blood test.** / For all people born from [birth year] through 1964 and any individual with known risks for hepatitis C.  Osteoporosis screening.** / A one-time screening for women ages 83 years and over and women at risk for fractures or osteoporosis.  Skin self-exam. / Monthly.  Influenza vaccine. / Every year.  Tetanus, diphtheria, and acellular pertussis (Tdap/Td) vaccine.** / 1 dose of Td every 10 years.  Varicella vaccine.** / Consult your health care provider.  Zoster vaccine.** / 1 dose for adults aged 61 years or older.  Pneumococcal 13-valent conjugate (PCV13) vaccine.** / Consult your health care provider.  Pneumococcal  polysaccharide (PPSV23) vaccine.** / 1 dose for all adults aged 28 years and older.  Meningococcal vaccine.** / Consult your health care provider.  Hepatitis A vaccine.** / Consult your health care provider.  Hepatitis B vaccine.** / Consult your health care provider.  Haemophilus influenzae type b (Hib) vaccine.** / Consult your health care provider. ** Family history and personal history of risk and conditions may change your health care provider's recommendations. Document Released: 07/31/2001 Document Revised: 10/19/2013 Document Reviewed: 10/30/2010 [facility] Patient Information 2015 [place], [place]. This information is not intended to replace advice given to you by your health care provider. Make sure you discuss any questions you have with your health care provider.

## 2014-07-14 LAB — MICROALBUMIN / CREATININE URINE RATIO
Creatinine,U: 80.3 mg/dL
Microalb Creat Ratio: 0.4 mg/g (ref 0.0–30.0)
Microalb, Ur: 0.3 mg/dL (ref 0.0–1.9)

## 2014-08-05 ENCOUNTER — Other Ambulatory Visit: Payer: Self-pay | Admitting: Family Medicine

## 2014-11-04 ENCOUNTER — Other Ambulatory Visit: Payer: Self-pay | Admitting: Family Medicine

## 2014-11-12 ENCOUNTER — Encounter: Payer: Self-pay | Admitting: Family Medicine

## 2014-11-12 ENCOUNTER — Ambulatory Visit (INDEPENDENT_AMBULATORY_CARE_PROVIDER_SITE_OTHER): Payer: BLUE CROSS/BLUE SHIELD | Admitting: Family Medicine

## 2014-11-12 VITALS — BP 122/84 | HR 80 | Temp 98.3°F | Wt 273.0 lb

## 2014-11-12 DIAGNOSIS — E785 Hyperlipidemia, unspecified: Secondary | ICD-10-CM

## 2014-11-12 DIAGNOSIS — Z114 Encounter for screening for human immunodeficiency virus [HIV]: Secondary | ICD-10-CM

## 2014-11-12 DIAGNOSIS — T753XXA Motion sickness, initial encounter: Secondary | ICD-10-CM

## 2014-11-12 DIAGNOSIS — I10 Essential (primary) hypertension: Secondary | ICD-10-CM

## 2014-11-12 DIAGNOSIS — D649 Anemia, unspecified: Secondary | ICD-10-CM

## 2014-11-12 LAB — CBC WITH DIFFERENTIAL/PLATELET
Basophils Absolute: 0 10*3/uL (ref 0.0–0.1)
Basophils Relative: 0.7 % (ref 0.0–3.0)
Eosinophils Absolute: 0.2 10*3/uL (ref 0.0–0.7)
Eosinophils Relative: 3.7 % (ref 0.0–5.0)
HCT: 35.1 % — ABNORMAL LOW (ref 36.0–46.0)
Hemoglobin: 11.4 g/dL — ABNORMAL LOW (ref 12.0–15.0)
Lymphocytes Relative: 24.5 % (ref 12.0–46.0)
Lymphs Abs: 1.5 10*3/uL (ref 0.7–4.0)
MCHC: 32.4 g/dL (ref 30.0–36.0)
MCV: 80 fl (ref 78.0–100.0)
Monocytes Absolute: 0.4 10*3/uL (ref 0.1–1.0)
Monocytes Relative: 6.9 % (ref 3.0–12.0)
Neutro Abs: 3.9 10*3/uL (ref 1.4–7.7)
Neutrophils Relative %: 64.2 % (ref 43.0–77.0)
Platelets: 332 10*3/uL (ref 150.0–400.0)
RBC: 4.39 Mil/uL (ref 3.87–5.11)
RDW: 15.7 % — ABNORMAL HIGH (ref 11.5–15.5)
WBC: 6.1 10*3/uL (ref 4.0–10.5)

## 2014-11-12 LAB — HEPATIC FUNCTION PANEL
ALT: 22 U/L (ref 0–35)
AST: 24 U/L (ref 0–37)
Albumin: 4.2 g/dL (ref 3.5–5.2)
Alkaline Phosphatase: 54 U/L (ref 39–117)
Bilirubin, Direct: 0.2 mg/dL (ref 0.0–0.3)
Total Bilirubin: 0.9 mg/dL (ref 0.2–1.2)
Total Protein: 8 g/dL (ref 6.0–8.3)

## 2014-11-12 LAB — LIPID PANEL
Cholesterol: 204 mg/dL — ABNORMAL HIGH (ref 0–200)
HDL: 51 mg/dL (ref >39.00)
LDL Cholesterol: 127 mg/dL — ABNORMAL HIGH (ref 0–99)
NonHDL: 153
Total CHOL/HDL Ratio: 4
Triglycerides: 132 mg/dL (ref 0.0–149.0)
VLDL: 26.4 mg/dL (ref 0.0–40.0)

## 2014-11-12 LAB — BASIC METABOLIC PANEL
BUN: 9 mg/dL (ref 6–23)
CO2: 28 mEq/L (ref 19–32)
Calcium: 9.3 mg/dL (ref 8.4–10.5)
Chloride: 102 mEq/L (ref 96–112)
Creatinine, Ser: 0.7 mg/dL (ref 0.40–1.20)
GFR: 117.67 mL/min (ref >60.00)
Glucose, Bld: 115 mg/dL — ABNORMAL HIGH (ref 70–99)
Potassium: 3.7 mEq/L (ref 3.5–5.1)
Sodium: 136 mEq/L (ref 135–145)

## 2014-11-12 LAB — IBC PANEL
Iron: 57 ug/dL (ref 42–145)
Saturation Ratios: 13.3 % — ABNORMAL LOW (ref 20.0–50.0)
Transferrin: 306 mg/dL (ref 212.0–360.0)

## 2014-11-12 MED ORDER — SCOPOLAMINE 1 MG/3DAYS TD PT72: 1.0000 | MEDICATED_PATCH | TRANSDERMAL | Status: DC

## 2014-11-12 NOTE — Progress Notes (Signed)
Pre visit review using our clinic review tool, if applicable. No additional management support is needed unless otherwise documented below in the visit note. 

## 2014-11-12 NOTE — Patient Instructions (Signed)

## 2014-11-13 LAB — HIV ANTIBODY (ROUTINE TESTING W REFLEX): HIV 1&2 Ab, 4th Generation: NONREACTIVE

## 2014-11-13 NOTE — Progress Notes (Signed)
Patient ID: Michele Tran, female    DOB: [birth year]  Age: 44 y.o. MRN: [id number]    Subjective:  Subjective HPI Michele Tran presents for f/u htn and hyperlipidemia.  Review of Systems  Constitutional: Negative for diaphoresis, appetite change, fatigue and unexpected weight change.  Eyes: Negative for pain, redness and visual disturbance.  Respiratory: Negative for cough, chest tightness, shortness of breath and wheezing.   Cardiovascular: Negative for chest pain, palpitations and leg swelling.  Endocrine: Negative for cold intolerance, heat intolerance, polydipsia, polyphagia and polyuria.  Genitourinary: Negative for dysuria, frequency and difficulty urinating.  Neurological: Negative for dizziness, light-headedness, numbness and headaches.    History Past Medical History  Diagnosis Date  . Hyperlipidemia   . Hypertension   . Obesity, morbid   . Abdominal bloating   . Hirsutism   . Polycystic ovaries     She has past surgical history that includes UTERINE polyps (05-31-10).   Her family history includes Arthritis in an other family member; Breast cancer in her paternal aunt; Cancer in her paternal aunt; Cancer (age of onset: 3378) in her father; Coronary artery disease in her paternal uncle; Diabetes in an other family member; Hyperlipidemia in an other family member; Hypertension in an other family member; Leukemia in her paternal uncle; Lung cancer in her paternal aunt; Stroke (age of onset: 7371) in her mother.She reports that she has never smoked. She has never used smokeless tobacco. She reports that she does not drink alcohol or use illicit drugs.  Current Outpatient Prescriptions on File Prior to Visit  Medication Sig Dispense Refill  . amLODipine (NORVASC) 5 MG tablet Take 1 tablet (5 mg total) by mouth daily. 90 tablet 3  . atorvastatin (LIPITOR) 20 MG tablet TAKE 1 TABLET DAILY 90 tablet 1  . carvedilol (COREG) 25 MG tablet take 1 tablet by mouth twice a day WITH A  MEAL 180 tablet 3  . fluocinonide cream (LIDEX) 0.05 % APPLY TO AFFECTED AREA 2 TIMES A DAY AS NEEDED 60 g 2  . furosemide (LASIX) 20 MG tablet take 1 tablet by mouth once daily 90 tablet 3   No current facility-administered medications on file prior to visit.     Objective:  Objective Physical Exam  Constitutional: She is oriented to person, place, and time. She appears well-developed and well-nourished.  HENT:  Head: Normocephalic and atraumatic.  Eyes: Conjunctivae and EOM are normal.  Neck: Normal range of motion. Neck supple. No JVD present. Carotid bruit is not present. No thyromegaly present.  Cardiovascular: Normal rate, regular rhythm and normal heart sounds.   No murmur heard. Pulmonary/Chest: Effort normal and breath sounds normal. No respiratory distress. She has no wheezes. She has no rales. She exhibits no tenderness.  Musculoskeletal: She exhibits no edema.  Neurological: She is alert and oriented to person, place, and time.  Psychiatric: She has a normal mood and affect.   BP 122/84 mmHg  Pulse 80  Temp(Src) 98.3 F (36.8 C) (Oral)  Wt 273 lb (123.832 kg)  SpO2 99%  LMP 11/11/2014 Wt Readings from Last 3 Encounters:  11/12/14 273 lb (123.832 kg)  07/13/14 272 lb 6.4 oz (123.56 kg)  02/19/14 273 lb 2.4 oz (123.9 kg)     Lab Results  Component Value Date   WBC 6.1 11/12/2014   HGB 11.4* 11/12/2014   HCT 35.1* 11/12/2014   PLT 332.0 11/12/2014   GLUCOSE 115* 11/12/2014   CHOL 204* 11/12/2014   TRIG 132.0 11/12/2014  HDL 51.00 11/12/2014   LDLDIRECT 149.6 03/26/2013   LDLCALC 127* 11/12/2014   ALT 22 11/12/2014   AST 24 11/12/2014   NA 136 11/12/2014   K 3.7 11/12/2014   CL 102 11/12/2014   CREATININE 0.70 11/12/2014   BUN 9 11/12/2014   CO2 28 11/12/2014   TSH 0.74 07/13/2014   INR 1.01 04/10/2011   HGBA1C 6.3 05/08/2011   MICROALBUR 0.3 07/13/2014    No results found.   Assessment & Plan:  Plan I am having Michele Tran start on  scopolamine. I am also having her maintain her fluocinonide cream, amLODipine, carvedilol, furosemide, and atorvastatin.  Meds ordered this encounter  Medications  . scopolamine (TRANSDERM-SCOP, 1.5 MG,) 1 MG/3DAYS    Sig: Place 1 patch (1.5 mg total) onto the skin every 3 (three) days.    Dispense:  1 patch    Refill:  0    Problem List Items Addressed This Visit    Hyperlipidemia LDL goal <100    con't lipitor Check labs      Essential hypertension - Primary    con't coreg and norvasc       Other Visit Diagnoses    Anemia, unspecified anemia type        Relevant Orders    IBC panel (Completed)    CBC with Differential/Platelet (Completed)    Hyperlipidemia        Relevant Orders    Lipid panel (Completed)    Hepatic function panel (Completed)    Basic metabolic panel (Completed)    Screening for HIV (human immunodeficiency virus)        Relevant Orders    HIV antibody (Completed)    Motion sickness, initial encounter        Relevant Medications    scopolamine (TRANSDERM-SCOP, 1.5 MG,) 1 MG/3DAYS       Follow-up: Return in about 6 months (around 05/15/2015) for hypertension, hyperlipidemia.  Loreen Freud, DO

## 2014-11-15 NOTE — Assessment & Plan Note (Signed)
con't coreg and norvasc

## 2014-11-15 NOTE — Assessment & Plan Note (Signed)
con't lipitor Check labs 

## 2015-02-04 ENCOUNTER — Other Ambulatory Visit: Payer: Self-pay | Admitting: Family Medicine

## 2015-02-10 ENCOUNTER — Other Ambulatory Visit: Payer: Self-pay | Admitting: Obstetrics and Gynecology

## 2015-02-11 LAB — CYTOLOGY - PAP

## 2015-02-23 ENCOUNTER — Other Ambulatory Visit: Payer: Self-pay | Admitting: Family Medicine

## 2015-05-03 ENCOUNTER — Other Ambulatory Visit: Payer: Self-pay | Admitting: Family Medicine

## 2015-06-24 ENCOUNTER — Encounter: Payer: Self-pay | Admitting: Family Medicine

## 2015-06-24 ENCOUNTER — Ambulatory Visit (INDEPENDENT_AMBULATORY_CARE_PROVIDER_SITE_OTHER): Payer: BLUE CROSS/BLUE SHIELD | Admitting: Family Medicine

## 2015-06-24 VITALS — BP 143/83 | HR 74 | Temp 98.6°F | Wt 276.0 lb

## 2015-06-24 DIAGNOSIS — J0110 Acute frontal sinusitis, unspecified: Secondary | ICD-10-CM

## 2015-06-24 MED ORDER — FLUTICASONE PROPIONATE 50 MCG/ACT NA SUSP: 2.0000 | Freq: Every day | NASAL | Status: DC

## 2015-06-24 MED ORDER — AMOXICILLIN-POT CLAVULANATE 875-125 MG PO TABS: 1.0000 | ORAL_TABLET | Freq: Two times a day (BID) | ORAL | Status: DC

## 2015-06-24 MED FILL — SM ALLERGY RELIEF 50 MCG SP: 50 MCG | 30 days supply | Qty: 16 | Fill #0

## 2015-06-24 MED FILL — AMOX-CLAV 875-125 MG TABLET: 875-125 | 10 days supply | Qty: 20 | Fill #0

## 2015-06-24 NOTE — Patient Instructions (Signed)

## 2015-06-24 NOTE — Progress Notes (Signed)
  Subjective:     Michele Tran is a 45 y.o. female who presents for evaluation of sinus pain. Symptoms include: congestion, cough, fevers, headaches, nasal congestion, sinus pressure, sore throat and ear pain. Onset of symptoms was 2 weeks ago. Symptoms have been gradually worsening since that time. Past history is significant for no history of pneumonia or bronchitis. Patient is a non-smoker.  The following portions of the patient's history were reviewed and updated as appropriate:  She  has a past medical history of Hyperlipidemia; Hypertension; Obesity, morbid (HCC); Abdominal bloating; Hirsutism; and Polycystic ovaries. She  does not have any pertinent problems on file. She  has past surgical history that includes UTERINE polyps (05-31-10). Her family history includes Breast cancer in her paternal aunt; Cancer in her paternal aunt; Cancer (age of onset: 5578) in her father; Coronary artery disease in her paternal uncle; Leukemia in her paternal uncle; Lung cancer in her paternal aunt; Stroke (age of onset: 6671) in her mother. She  reports that she has never smoked. She has never used smokeless tobacco. She reports that she does not drink alcohol or use illicit drugs. She has a current medication list which includes the following prescription(s): amlodipine, atorvastatin, carvedilol, fluocinonide cream, and furosemide. Current Outpatient Prescriptions on File Prior to Visit  Medication Sig Dispense Refill  . amLODipine (NORVASC) 5 MG tablet TAKE 1 TABLET DAILY 90 tablet 2  . atorvastatin (LIPITOR) 20 MG tablet TAKE 1 TABLET DAILY 90 tablet 0  . carvedilol (COREG) 25 MG tablet TAKE 1 TABLET TWICE A DAY WITH MEALS 180 tablet 3  . fluocinonide cream (LIDEX) 0.05 % APPLY TO AFFECTED AREA 2 TIMES A DAY AS NEEDED 60 g 2  . furosemide (LASIX) 20 MG tablet TAKE 1 TABLET DAILY 90 tablet 3   No current facility-administered medications on file prior to visit.   She is allergic to ace inhibitors;  codeine; and fluviral..  Review of Systems As above  Objective:    BP 143/83 mmHg  Pulse 74  Temp(Src) 98.6 F (37 C) (Oral)  Wt 276 lb (125.193 kg)  SpO2 96% General appearance: alert, cooperative, appears stated age and no distress Ears: normal TM's and external ear canals both ears Nose: green discharge, moderate congestion, turbinates red, swollen, sinus tenderness bilateral Throat: abnormal findings: mild oropharyngeal erythema Neck: moderate anterior cervical adenopathy, supple, symmetrical, trachea midline and thyroid not enlarged, symmetric, no tenderness/mass/nodules Lungs: clear to auscultation bilaterally    Assessment:    Acute bacterial sinusitis.    Plan:    Nasal saline sprays. Nasal steroids per medication orders. Antihistamines per medication orders. Augmentin per medication orders.

## 2015-07-30 ENCOUNTER — Other Ambulatory Visit: Payer: Self-pay | Admitting: Family Medicine

## 2015-08-15 ENCOUNTER — Telehealth: Payer: Self-pay | Admitting: *Deleted

## 2015-08-15 NOTE — Telephone Encounter (Signed)
Unable to reach patient at time of pre-visit call. Left message for patient to return call when available.  

## 2015-08-16 ENCOUNTER — Ambulatory Visit (INDEPENDENT_AMBULATORY_CARE_PROVIDER_SITE_OTHER): Payer: BLUE CROSS/BLUE SHIELD | Admitting: Family Medicine

## 2015-08-16 ENCOUNTER — Encounter: Payer: Self-pay | Admitting: Family Medicine

## 2015-08-16 VITALS — BP 124/86 | HR 75 | Temp 98.1°F | Ht 67.0 in | Wt 278.0 lb

## 2015-08-16 DIAGNOSIS — I10 Essential (primary) hypertension: Secondary | ICD-10-CM

## 2015-08-16 DIAGNOSIS — R319 Hematuria, unspecified: Secondary | ICD-10-CM

## 2015-08-16 DIAGNOSIS — Z0000 Encounter for general adult medical examination without abnormal findings: Secondary | ICD-10-CM | POA: Diagnosis not present

## 2015-08-16 LAB — POCT URINALYSIS DIPSTICK
Bilirubin, UA: NEGATIVE
Glucose, UA: NEGATIVE
Ketones, UA: NEGATIVE
Leukocytes, UA: NEGATIVE
Nitrite, UA: NEGATIVE
Protein, UA: NEGATIVE
Spec Grav, UA: 1.02
Urobilinogen, UA: 0.2
pH, UA: 6

## 2015-08-16 MED ORDER — LORCASERIN HCL 10 MG PO TABS: ORAL_TABLET | ORAL | Status: DC

## 2015-08-16 MED ORDER — AMLODIPINE BESYLATE 5 MG PO TABS: 5.0000 mg | ORAL_TABLET | Freq: Every day | ORAL | Status: DC

## 2015-08-16 MED ORDER — FUROSEMIDE 20 MG PO TABS: 20.0000 mg | ORAL_TABLET | Freq: Every day | ORAL | Status: DC

## 2015-08-16 NOTE — Patient Instructions (Addendum)
Exercising to Lose Weight Exercising can help you to lose weight. In order to lose weight through exercise, you need to do vigorous-intensity exercise. You can tell that you are exercising with vigorous intensity if you are breathing very hard and fast and cannot hold a conversation while exercising. Moderate-intensity exercise helps to maintain your current weight. You can tell that you are exercising at a moderate level if you have a higher heart rate and faster breathing, but you are still able to hold a conversation. HOW OFTEN SHOULD I EXERCISE? Choose an activity that you enjoy and set realistic goals. Your health care provider can help you to make an activity plan that works for you. Exercise regularly as directed by your health care provider. This may include:  Doing resistance training twice each week, such as:  Push-ups.  Sit-ups.  Lifting weights.  Using resistance bands.  Doing a given intensity of exercise for a given amount of time. Choose from these options:  150 minutes of moderate-intensity exercise every week.  75 minutes of vigorous-intensity exercise every week.  A mix of moderate-intensity and vigorous-intensity exercise every week. Children, pregnant women, people who are out of shape, people who are overweight, and older adults may need to consult a health care provider for individual recommendations. If you have any sort of medical condition, be sure to consult your health care provider before starting a new exercise program. WHAT ARE SOME ACTIVITIES THAT CAN HELP ME TO LOSE WEIGHT?   Walking at a rate of at least 4.5 miles an hour.  Jogging or running at a rate of 5 miles per hour.  Biking at a rate of at least 10 miles per hour.  Lap swimming.  Roller-skating or in-line skating.  Cross-country skiing.  Vigorous competitive sports, such as football, basketball, and soccer.  Jumping rope.  Aerobic dancing. HOW CAN I BE MORE ACTIVE IN MY DAY-TO-DAY  ACTIVITIES?  Use the stairs instead of the elevator.  Take a walk during your lunch break.  If you drive, park your car farther away from work or school.  If you take public transportation, get off one stop early and walk the rest of the way.  Make all of your phone calls while standing up and walking around.  Get up, stretch, and walk around every 30 minutes throughout the day. WHAT GUIDELINES SHOULD I FOLLOW WHILE EXERCISING?  Do not exercise so much that you hurt yourself, feel dizzy, or get very short of breath.  Consult your health care provider prior to starting a new exercise program.  Wear comfortable clothes and shoes with good support.  Drink plenty of water while you exercise to prevent dehydration or heat stroke. Body water is lost during exercise and must be replaced.  Work out until you breathe faster and your heart beats faster.   This information is not intended to replace advice given to you by your health care provider. Make sure you discuss any questions you have with your health care provider.   Document Released: 07/07/2010 Document Revised: 06/25/2014 Document Reviewed: 11/05/2013 Elsevier Interactive Patient Education 2016 [place] for Adults, Female A healthy lifestyle and preventive care can promote health and wellness. Preventive health guidelines for women include the following key practices.  A routine yearly physical is a good way to check with your health care provider about your health and preventive screening. It is a chance to share any concerns and updates on your health and to  receive a thorough exam.  Visit your dentist for a routine exam and preventive care every 6 months. Brush your teeth twice a day and floss once a day. Good oral hygiene prevents tooth decay and gum disease.  The frequency of eye exams is based on your age, health, family medical history, use of contact lenses, and other factors. Follow your  health care provider's recommendations for frequency of eye exams.  Eat a healthy diet. Foods like vegetables, fruits, whole grains, low-fat dairy products, and lean protein foods contain the nutrients you need without too many calories. Decrease your intake of foods high in solid fats, added sugars, and salt. Eat the right amount of calories for you.Get information about a proper diet from your health care provider, if necessary.  Regular physical exercise is one of the most important things you can do for your health. Most adults should get at least 150 minutes of moderate-intensity exercise (any activity that increases your heart rate and causes you to sweat) each week. In addition, most adults need muscle-strengthening exercises on 2 or more days a week.  Maintain a healthy weight. The body mass index (BMI) is a screening tool to identify possible weight problems. It provides an estimate of body fat based on height and weight. Your health care provider can find your BMI and can help you achieve or maintain a healthy weight.For adults 20 years and older:  A BMI below 18.5 is considered underweight.  A BMI of 18.5 to 24.9 is normal.  A BMI of 25 to 29.9 is considered overweight.  A BMI of 30 and above is considered obese.  Maintain normal blood lipids and cholesterol levels by exercising and minimizing your intake of saturated fat. Eat a balanced diet with plenty of fruit and vegetables. Blood tests for lipids and cholesterol should begin at age 14 and be repeated every 5 years. If your lipid or cholesterol levels are high, you are over 50, or you are at high risk for heart disease, you may need your cholesterol levels checked more frequently.Ongoing high lipid and cholesterol levels should be treated with medicines if diet and exercise are not working.  If you smoke, find out from your health care provider how to quit. If you do not use tobacco, do not start.  Lung cancer screening is  recommended for adults aged 29-80 years who are at high risk for developing lung cancer because of a history of smoking. A yearly low-dose CT scan of the lungs is recommended for people who have at least a 30-pack-year history of smoking and are a current smoker or have quit within the past 15 years. A pack year of smoking is smoking an average of 1 pack of cigarettes a day for 1 year (for example: 1 pack a day for 30 years or 2 packs a day for 15 years). Yearly screening should continue until the smoker has stopped smoking for at least 15 years. Yearly screening should be stopped for people who develop a health problem that would prevent them from having lung cancer treatment.  If you are pregnant, do not drink alcohol. If you are breastfeeding, be very cautious about drinking alcohol. If you are not pregnant and choose to drink alcohol, do not have more than 1 drink per day. One drink is considered to be 12 ounces (355 mL) of beer, 5 ounces (148 mL) of wine, or 1.5 ounces (44 mL) of liquor.  Avoid use of street drugs. Do not share needles with  anyone. Ask for help if you need support or instructions about stopping the use of drugs.  High blood pressure causes heart disease and increases the risk of stroke. Your blood pressure should be checked at least every 1 to 2 years. Ongoing high blood pressure should be treated with medicines if weight loss and exercise do not work.  If you are 60-31 years old, ask your health care provider if you should take aspirin to prevent strokes.  Diabetes screening is done by taking a blood sample to check your blood glucose level after you have not eaten for a certain period of time (fasting). If you are not overweight and you do not have risk factors for diabetes, you should be screened once every 3 years starting at age 36. If you are overweight or obese and you are 73-60 years of age, you should be screened for diabetes every year as part of your cardiovascular risk  assessment.  Breast cancer screening is essential preventive care for women. You should practice "breast self-awareness." This means understanding the normal appearance and feel of your breasts and may include breast self-examination. Any changes detected, no matter how small, should be reported to a health care provider. Women in their 77s and 30s should have a clinical breast exam (CBE) by a health care provider as part of a regular health exam every 1 to 3 years. After age 22, women should have a CBE every year. Starting at age 66, women should consider having a mammogram (breast X-ray test) every year. Women who have a family history of breast cancer should talk to their health care provider about genetic screening. Women at a high risk of breast cancer should talk to their health care providers about having an MRI and a mammogram every year.  Breast cancer gene (BRCA)-related cancer risk assessment is recommended for women who have family members with BRCA-related cancers. BRCA-related cancers include breast, ovarian, tubal, and peritoneal cancers. Having family members with these cancers may be associated with an increased risk for harmful changes (mutations) in the breast cancer genes BRCA1 and BRCA2. Results of the assessment will determine the need for genetic counseling and BRCA1 and BRCA2 testing.  Your health care provider may recommend that you be screened regularly for cancer of the pelvic organs (ovaries, uterus, and vagina). This screening involves a pelvic examination, including checking for microscopic changes to the surface of your cervix (Pap test). You may be encouraged to have this screening done every 3 years, beginning at age 68.  For women ages 31-65, health care providers may recommend pelvic exams and Pap testing every 3 years, or they may recommend the Pap and pelvic exam, combined with testing for human papilloma virus (HPV), every 5 years. Some types of HPV increase your risk of  cervical cancer. Testing for HPV may also be done on women of any age with unclear Pap test results.  Other health care providers may not recommend any screening for nonpregnant women who are considered low risk for pelvic cancer and who do not have symptoms. Ask your health care provider if a screening pelvic exam is right for you.  If you have had past treatment for cervical cancer or a condition that could lead to cancer, you need Pap tests and screening for cancer for at least 20 years after your treatment. If Pap tests have been discontinued, your risk factors (such as having a new sexual partner) need to be reassessed to determine if screening should resume. Some  women have medical problems that increase the chance of getting cervical cancer. In these cases, your health care provider may recommend more frequent screening and Pap tests.  Colorectal cancer can be detected and often prevented. Most routine colorectal cancer screening begins at the age of 26 years and continues through age 22 years. However, your health care provider may recommend screening at an earlier age if you have risk factors for colon cancer. On a yearly basis, your health care provider may provide home test kits to check for hidden blood in the stool. Use of a small camera at the end of a tube, to directly examine the colon (sigmoidoscopy or colonoscopy), can detect the earliest forms of colorectal cancer. Talk to your health care provider about this at age 53, when routine screening begins. Direct exam of the colon should be repeated every 5-10 years through age 47 years, unless early forms of precancerous polyps or small growths are found.  People who are at an increased risk for hepatitis B should be screened for this virus. You are considered at high risk for hepatitis B if:  You were born in a country where hepatitis B occurs often. Talk with your health care provider about which countries are considered high risk.  Your  parents were born in a high-risk country and you have not received a shot to protect against hepatitis B (hepatitis B vaccine).  You have HIV or AIDS.  You use needles to inject street drugs.  You live with, or have sex with, someone who has hepatitis B.  You get hemodialysis treatment.  You take certain medicines for conditions like cancer, organ transplantation, and autoimmune conditions.  Hepatitis C blood testing is recommended for all people born from [birth year] through 1964 and any individual with known risks for hepatitis C.  Practice safe sex. Use condoms and avoid high-risk sexual practices to reduce the spread of sexually transmitted infections (STIs). STIs include gonorrhea, chlamydia, syphilis, trichomonas, herpes, HPV, and human immunodeficiency virus (HIV). Herpes, HIV, and HPV are viral illnesses that have no cure. They can result in disability, cancer, and death.  You should be screened for sexually transmitted illnesses (STIs) including gonorrhea and chlamydia if:  You are sexually active and are younger than 24 years.  You are older than 24 years and your health care provider tells you that you are at risk for this type of infection.  Your sexual activity has changed since you were last screened and you are at an increased risk for chlamydia or gonorrhea. Ask your health care provider if you are at risk.  If you are at risk of being infected with HIV, it is recommended that you take a prescription medicine daily to prevent HIV infection. This is called preexposure prophylaxis (PrEP). You are considered at risk if:  You are sexually active and do not regularly use condoms or know the HIV status of your partner(s).  You take drugs by injection.  You are sexually active with a partner who has HIV.  Talk with your health care provider about whether you are at high risk of being infected with HIV. If you choose to begin PrEP, you should first be tested for HIV. You should then  be tested every 3 months for as long as you are taking PrEP.  Osteoporosis is a disease in which the bones lose minerals and strength with aging. This can result in serious bone fractures or breaks. The risk of osteoporosis can be identified using a bone  density scan. Women ages 49 years and over and women at risk for fractures or osteoporosis should discuss screening with their health care providers. Ask your health care provider whether you should take a calcium supplement or vitamin D to reduce the rate of osteoporosis.  Menopause can be associated with physical symptoms and risks. Hormone replacement therapy is available to decrease symptoms and risks. You should talk to your health care provider about whether hormone replacement therapy is right for you.  Use sunscreen. Apply sunscreen liberally and repeatedly throughout the day. You should seek shade when your shadow is shorter than you. Protect yourself by wearing long sleeves, pants, a wide-brimmed hat, and sunglasses year round, whenever you are outdoors.  Once a month, do a whole body skin exam, using a mirror to look at the skin on your back. Tell your health care provider of new moles, moles that have irregular borders, moles that are larger than a pencil eraser, or moles that have changed in shape or color.  Stay current with required vaccines (immunizations).  Influenza vaccine. All adults should be immunized every year.  Tetanus, diphtheria, and acellular pertussis (Td, Tdap) vaccine. Pregnant women should receive 1 dose of Tdap vaccine during each pregnancy. The dose should be obtained regardless of the length of time since the last dose. Immunization is preferred during the 27th-36th week of gestation. An adult who has not previously received Tdap or who does not know her vaccine status should receive 1 dose of Tdap. This initial dose should be followed by tetanus and diphtheria toxoids (Td) booster doses every 10 years. Adults with an  unknown or incomplete history of completing a 3-dose immunization series with Td-containing vaccines should begin or complete a primary immunization series including a Tdap dose. Adults should receive a Td booster every 10 years.  Varicella vaccine. An adult without evidence of immunity to varicella should receive 2 doses or a second dose if she has previously received 1 dose. Pregnant females who do not have evidence of immunity should receive the first dose after pregnancy. This first dose should be obtained before leaving the health care facility. The second dose should be obtained 4-8 weeks after the first dose.  Human papillomavirus (HPV) vaccine. Females aged 13-26 years who have not received the vaccine previously should obtain the 3-dose series. The vaccine is not recommended for use in pregnant females. However, pregnancy testing is not needed before receiving a dose. If a female is found to be pregnant after receiving a dose, no treatment is needed. In that case, the remaining doses should be delayed until after the pregnancy. Immunization is recommended for any person with an immunocompromised condition through the age of 69 years if she did not get any or all doses earlier. During the 3-dose series, the second dose should be obtained 4-8 weeks after the first dose. The third dose should be obtained 24 weeks after the first dose and 16 weeks after the second dose.  Zoster vaccine. One dose is recommended for adults aged 74 years or older unless certain conditions are present.  Measles, mumps, and rubella (MMR) vaccine. Adults born before 21 generally are considered immune to measles and mumps. Adults born in [birth year] or later should have 1 or more doses of MMR vaccine unless there is a contraindication to the vaccine or there is laboratory evidence of immunity to each of the three diseases. A routine second dose of MMR vaccine should be obtained at least 28 days after the first  dose for students  attending postsecondary schools, health care workers, or international travelers. People who received inactivated measles vaccine or an unknown type of measles vaccine during 1963-1967 should receive 2 doses of MMR vaccine. People who received inactivated mumps vaccine or an unknown type of mumps vaccine before 1979 and are at high risk for mumps infection should consider immunization with 2 doses of MMR vaccine. For females of childbearing age, rubella immunity should be determined. If there is no evidence of immunity, females who are not pregnant should be vaccinated. If there is no evidence of immunity, females who are pregnant should delay immunization until after pregnancy. Unvaccinated health care workers born before [birth year] who lack laboratory evidence of measles, mumps, or rubella immunity or laboratory confirmation of disease should consider measles and mumps immunization with 2 doses of MMR vaccine or rubella immunization with 1 dose of MMR vaccine.  Pneumococcal 13-valent conjugate (PCV13) vaccine. When indicated, a person who is uncertain of his immunization history and has no record of immunization should receive the PCV13 vaccine. All adults 56 years of age and older should receive this vaccine. An adult aged 86 years or older who has certain medical conditions and has not been previously immunized should receive 1 dose of PCV13 vaccine. This PCV13 should be followed with a dose of pneumococcal polysaccharide (PPSV23) vaccine. Adults who are at high risk for pneumococcal disease should obtain the PPSV23 vaccine at least 8 weeks after the dose of PCV13 vaccine. Adults older than 44 years of age who have normal immune system function should obtain the PPSV23 vaccine dose at least 1 year after the dose of PCV13 vaccine.  Pneumococcal polysaccharide (PPSV23) vaccine. When PCV13 is also indicated, PCV13 should be obtained first. All adults aged 69 years and older should be immunized. An adult younger than  age 70 years who has certain medical conditions should be immunized. Any person who resides in a nursing home or long-term care facility should be immunized. An adult smoker should be immunized. People with an immunocompromised condition and certain other conditions should receive both PCV13 and PPSV23 vaccines. People with human immunodeficiency virus (HIV) infection should be immunized as soon as possible after diagnosis. Immunization during chemotherapy or radiation therapy should be avoided. Routine use of PPSV23 vaccine is not recommended for American Indians, [place] Natives, or people younger than 65 years unless there are medical conditions that require PPSV23 vaccine. When indicated, people who have unknown immunization and have no record of immunization should receive PPSV23 vaccine. One-time revaccination 5 years after the first dose of PPSV23 is recommended for people aged 19-64 years who have chronic kidney failure, nephrotic syndrome, asplenia, or immunocompromised conditions. People who received 1-2 doses of PPSV23 before age 55 years should receive another dose of PPSV23 vaccine at age 15 years or later if at least 5 years have passed since the previous dose. Doses of PPSV23 are not needed for people immunized with PPSV23 at or after age 44 years.  Meningococcal vaccine. Adults with asplenia or persistent complement component deficiencies should receive 2 doses of quadrivalent meningococcal conjugate (MenACWY-D) vaccine. The doses should be obtained at least 2 months apart. Microbiologists working with certain meningococcal bacteria, [place] recruits, people at risk during an outbreak, and people who travel to or live in countries with a high rate of meningitis should be immunized. A first-year college student up through age 70 years who is living in a residence hall should receive a dose if she did not receive a  dose on or after her 16th birthday. Adults who have certain high-risk conditions  should receive one or more doses of vaccine.  Hepatitis A vaccine. Adults who wish to be protected from this disease, have certain high-risk conditions, work with hepatitis A-infected animals, work in hepatitis A research labs, or travel to or work in countries with a high rate of hepatitis A should be immunized. Adults who were previously unvaccinated and who anticipate close contact with an international adoptee during the first 60 days after arrival in the [place] from a country with a high rate of hepatitis A should be immunized.  Hepatitis B vaccine. Adults who wish to be protected from this disease, have certain high-risk conditions, may be exposed to blood or other infectious body fluids, are household contacts or sex partners of hepatitis B positive people, are clients or workers in certain care facilities, or travel to or work in countries with a high rate of hepatitis B should be immunized.  Haemophilus influenzae type b (Hib) vaccine. A previously unvaccinated person with asplenia or sickle cell disease or having a scheduled splenectomy should receive 1 dose of Hib vaccine. Regardless of previous immunization, a recipient of a hematopoietic stem cell transplant should receive a 3-dose series 6-12 months after her successful transplant. Hib vaccine is not recommended for adults with HIV infection. Preventive Services / Frequency Ages 45 to 38 years  Blood pressure check.** / Every 3-5 years.  Lipid and cholesterol check.** / Every 5 years beginning at age 19.  Clinical breast exam.** / Every 3 years for women in their 84s and 48s.  BRCA-related cancer risk assessment.** / For women who have family members with a BRCA-related cancer (breast, ovarian, tubal, or peritoneal cancers).  Pap test.** / Every 2 years from ages 44 through 4. Every 3 years starting at age 9 through age 10 or 65 with a history of 3 consecutive normal Pap tests.  HPV screening.** / Every 3 years from ages 36  through ages 34 to 48 with a history of 3 consecutive normal Pap tests.  Hepatitis C blood test.** / For any individual with known risks for hepatitis C.  Skin self-exam. / Monthly.  Influenza vaccine. / Every year.  Tetanus, diphtheria, and acellular pertussis (Tdap, Td) vaccine.** / Consult your health care provider. Pregnant women should receive 1 dose of Tdap vaccine during each pregnancy. 1 dose of Td every 10 years.  Varicella vaccine.** / Consult your health care provider. Pregnant females who do not have evidence of immunity should receive the first dose after pregnancy.  HPV vaccine. / 3 doses over 6 months, if 4 and younger. The vaccine is not recommended for use in pregnant females. However, pregnancy testing is not needed before receiving a dose.  Measles, mumps, rubella (MMR) vaccine.** / You need at least 1 dose of MMR if you were born in 1957 or later. You may also need a 2nd dose. For females of childbearing age, rubella immunity should be determined. If there is no evidence of immunity, females who are not pregnant should be vaccinated. If there is no evidence of immunity, females who are pregnant should delay immunization until after pregnancy.  Pneumococcal 13-valent conjugate (PCV13) vaccine.** / Consult your health care provider.  Pneumococcal polysaccharide (PPSV23) vaccine.** / 1 to 2 doses if you smoke cigarettes or if you have certain conditions.  Meningococcal vaccine.** / 1 dose if you are age 39 to 54 years and a Market researcher living in a residence  hall, or have one of several medical conditions, you need to get vaccinated against meningococcal disease. You may also need additional booster doses.  Hepatitis A vaccine.** / Consult your health care provider.  Hepatitis B vaccine.** / Consult your health care provider.  Haemophilus influenzae type b (Hib) vaccine.** / Consult your health care provider. Ages 42 to 80 years  Blood pressure check.** /  Every year.  Lipid and cholesterol check.** / Every 5 years beginning at age 37 years.  Lung cancer screening. / Every year if you are aged 81-80 years and have a 30-pack-year history of smoking and currently smoke or have quit within the past 15 years. Yearly screening is stopped once you have quit smoking for at least 15 years or develop a health problem that would prevent you from having lung cancer treatment.  Clinical breast exam.** / Every year after age 88 years.  BRCA-related cancer risk assessment.** / For women who have family members with a BRCA-related cancer (breast, ovarian, tubal, or peritoneal cancers).  Mammogram.** / Every year beginning at age 41 years and continuing for as long as you are in good health. Consult with your health care provider.  Pap test.** / Every 3 years starting at age 61 years through age 10 or 70 years with a history of 3 consecutive normal Pap tests.  HPV screening.** / Every 3 years from ages 53 years through ages 3 to 26 years with a history of 3 consecutive normal Pap tests.  Fecal occult blood test (FOBT) of stool. / Every year beginning at age 64 years and continuing until age 41 years. You may not need to do this test if you get a colonoscopy every 10 years.  Flexible sigmoidoscopy or colonoscopy.** / Every 5 years for a flexible sigmoidoscopy or every 10 years for a colonoscopy beginning at age 22 years and continuing until age 65 years.  Hepatitis C blood test.** / For all people born from [birth year] through 1964 and any individual with known risks for hepatitis C.  Skin self-exam. / Monthly.  Influenza vaccine. / Every year.  Tetanus, diphtheria, and acellular pertussis (Tdap/Td) vaccine.** / Consult your health care provider. Pregnant women should receive 1 dose of Tdap vaccine during each pregnancy. 1 dose of Td every 10 years.  Varicella vaccine.** / Consult your health care provider. Pregnant females who do not have evidence of immunity  should receive the first dose after pregnancy.  Zoster vaccine.** / 1 dose for adults aged 18 years or older.  Measles, mumps, rubella (MMR) vaccine.** / You need at least 1 dose of MMR if you were born in 1957 or later. You may also need a second dose. For females of childbearing age, rubella immunity should be determined. If there is no evidence of immunity, females who are not pregnant should be vaccinated. If there is no evidence of immunity, females who are pregnant should delay immunization until after pregnancy.  Pneumococcal 13-valent conjugate (PCV13) vaccine.** / Consult your health care provider.  Pneumococcal polysaccharide (PPSV23) vaccine.** / 1 to 2 doses if you smoke cigarettes or if you have certain conditions.  Meningococcal vaccine.** / Consult your health care provider.  Hepatitis A vaccine.** / Consult your health care provider.  Hepatitis B vaccine.** / Consult your health care provider.  Haemophilus influenzae type b (Hib) vaccine.** / Consult your health care provider. Ages 44 years and over  Blood pressure check.** / Every year.  Lipid and cholesterol check.** / Every 5 years beginning at age  20 years.  Lung cancer screening. / Every year if you are aged 46-80 years and have a 30-pack-year history of smoking and currently smoke or have quit within the past 15 years. Yearly screening is stopped once you have quit smoking for at least 15 years or develop a health problem that would prevent you from having lung cancer treatment.  Clinical breast exam.** / Every year after age 52 years.  BRCA-related cancer risk assessment.** / For women who have family members with a BRCA-related cancer (breast, ovarian, tubal, or peritoneal cancers).  Mammogram.** / Every year beginning at age 96 years and continuing for as long as you are in good health. Consult with your health care provider.  Pap test.** / Every 3 years starting at age 45 years through age 45 or 9 years with  3 consecutive normal Pap tests. Testing can be stopped between 65 and 70 years with 3 consecutive normal Pap tests and no abnormal Pap or HPV tests in the past 10 years.  HPV screening.** / Every 3 years from ages 56 years through ages 59 or 27 years with a history of 3 consecutive normal Pap tests. Testing can be stopped between 65 and 70 years with 3 consecutive normal Pap tests and no abnormal Pap or HPV tests in the past 10 years.  Fecal occult blood test (FOBT) of stool. / Every year beginning at age 35 years and continuing until age 78 years. You may not need to do this test if you get a colonoscopy every 10 years.  Flexible sigmoidoscopy or colonoscopy.** / Every 5 years for a flexible sigmoidoscopy or every 10 years for a colonoscopy beginning at age 40 years and continuing until age 6 years.  Hepatitis C blood test.** / For all people born from [birth year] through 1965 and any individual with known risks for hepatitis C.  Osteoporosis screening.** / A one-time screening for women ages 62 years and over and women at risk for fractures or osteoporosis.  Skin self-exam. / Monthly.  Influenza vaccine. / Every year.  Tetanus, diphtheria, and acellular pertussis (Tdap/Td) vaccine.** / 1 dose of Td every 10 years.  Varicella vaccine.** / Consult your health care provider.  Zoster vaccine.** / 1 dose for adults aged 51 years or older.  Pneumococcal 13-valent conjugate (PCV13) vaccine.** / Consult your health care provider.  Pneumococcal polysaccharide (PPSV23) vaccine.** / 1 dose for all adults aged 33 years and older.  Meningococcal vaccine.** / Consult your health care provider.  Hepatitis A vaccine.** / Consult your health care provider.  Hepatitis B vaccine.** / Consult your health care provider.  Haemophilus influenzae type b (Hib) vaccine.** / Consult your health care provider. ** Family history and personal history of risk and conditions may change your health care provider's  recommendations.   This information is not intended to replace advice given to you by your health care provider. Make sure you discuss any questions you have with your health care provider.   Document Released: 07/31/2001 Document Revised: 06/25/2014 Document Reviewed: 10/30/2010 Elsevier Interactive Patient Education [employer].

## 2015-08-16 NOTE — Progress Notes (Signed)
Pre visit review using our clinic review tool, if applicable. No additional management support is needed unless otherwise documented below in the visit note. 

## 2015-08-16 NOTE — Progress Notes (Signed)
Subjective:     Michele Tran is a 45 y.o. female and is here for a comprehensive physical exam. The patient reports problems - with weight loss.  pt is struggling to loss weight.  .  Social History   Social History  . Marital Status: Single    Spouse Name: N/A  . Number of Children: N/A  . Years of Education: N/A   Occupational History  . [employer]    Social History Main Topics  . Smoking status: Never Smoker   . Smokeless tobacco: Never Used  . Alcohol Use: No  . Drug Use: No  . Sexual Activity:    Partners: Male   Other Topics Concern  . Not on file   Social History Narrative   Exercise-- walking    Health Maintenance  Topic Date Due  . INFLUENZA VACCINE  06/23/2016 (Originally 01/17/2015)  . MAMMOGRAM  02/10/2016  . PAP SMEAR  02/09/2018  . TETANUS/TDAP  04/04/2020  . HIV Screening  Completed    The following portions of the patient's history were reviewed and updated as appropriate: allergies, current medications, past family history, past medical history, past social history, past surgical history and problem list.  Review of Systems Review of Systems  Constitutional: Negative for activity change, appetite change and fatigue.  HENT: Negative for hearing loss, congestion, tinnitus and ear discharge.  dentist q54mEyes: Negative for visual disturbance (see optho q1y -- vision corrected to 20/20 with glasses).  Respiratory: Negative for cough, chest tightness and shortness of breath.   Cardiovascular: Negative for chest pain, palpitations and leg swelling.  Gastrointestinal: Negative for abdominal pain, diarrhea, constipation and abdominal distention.  Genitourinary: Negative for urgency, frequency, decreased urine volume and difficulty urinating.  Musculoskeletal: Negative for back pain, arthralgias and gait problem.  Skin: Negative for color change, pallor and rash.  Neurological: Negative for dizziness, light-headedness, numbness and headaches.   Hematological: Negative for adenopathy. Does not bruise/bleed easily.  Psychiatric/Behavioral: Negative for suicidal ideas, confusion, sleep disturbance, self-injury, dysphoric mood, decreased concentration and agitation.        BP 124/86 mmHg  Pulse 75  Temp(Src) 98.1 F (36.7 C) (Oral)  Ht 5' 7" (1.702 m)  Wt 278 lb (126.1 kg)  BMI 43.53 kg/m2  SpO2 98%  LMP 08/13/2015 General appearance: alert, cooperative, appears stated age and no distress Head: Normocephalic, without obvious abnormality, atraumatic Eyes: conjunctivae/corneas clear. PERRL, EOM's intact. Fundi benign. Ears: normal TM's and external ear canals both ears Nose: Nares normal. Septum midline. Mucosa normal. No drainage or sinus tenderness. Throat: lips, mucosa, and tongue normal; teeth and gums normal Neck: no adenopathy, no carotid bruit, no JVD, supple, symmetrical, trachea midline and thyroid not enlarged, symmetric, no tenderness/mass/nodules Back: symmetric, no curvature. ROM normal. No CVA tenderness. Lungs: clear to auscultation bilaterally Breasts: gyn Heart: regular rate and rhythm, S1, S2 normal, no murmur, click, rub or gallop Abdomen: soft, non-tender; bowel sounds normal; no masses,  no organomegaly Pelvic: deferred --gyn Extremities: extremities normal, atraumatic, no cyanosis or edema Pulses: 2+ and symmetric Skin: Skin color, texture, turgor normal. No rashes or lesions Lymph nodes: Cervical, supraclavicular, and axillary nodes normal. Neurologic: Alert and oriented X 3, normal strength and tone. Normal symmetric reflexes. Normal coordination and gait Psych-no depression, no anxiety      Assessment:    Healthy female exam.   Plan:    ghm utd Check labs See After Visit Summary for Counseling Recommendations    1. Essential hypertension Stable con't  meds - amLODipine (NORVASC) 5 MG tablet; Take 1 tablet (5 mg total) by mouth daily.  Dispense: 90 tablet; Refill: 3 - furosemide (LASIX) 20  MG tablet; Take 1 tablet (20 mg total) by mouth daily.  Dispense: 90 tablet; Refill: 3 - Comp Met (CMET) - CBC with Differential/Platelet - Lipid panel - POCT urinalysis dipstick - Microalbumin / creatinine urine ratio - TSH  2. Preventative health care See above - Comp Met (CMET) - CBC with Differential/Platelet - Lipid panel - POCT urinalysis dipstick - Microalbumin / creatinine urine ratio - TSH  3. Morbid obesity, unspecified obesity type ([place]) D/w diet and exercise - Lorcaserin HCl (BELVIQ) 10 MG TABS; 1 po bid  Dispense: 60 tablet; Refill: 2

## 2015-08-17 ENCOUNTER — Encounter: Payer: Self-pay | Admitting: Family Medicine

## 2015-08-17 LAB — COMPREHENSIVE METABOLIC PANEL
ALT: 21 U/L (ref 0–35)
AST: 23 U/L (ref 0–37)
Albumin: 4.3 g/dL (ref 3.5–5.2)
Alkaline Phosphatase: 56 U/L (ref 39–117)
BUN: 10 mg/dL (ref 6–23)
CO2: 27 mEq/L (ref 19–32)
Calcium: 9.4 mg/dL (ref 8.4–10.5)
Chloride: 101 mEq/L (ref 96–112)
Creatinine, Ser: 0.73 mg/dL (ref 0.40–1.20)
GFR: 111.71 mL/min (ref >60.00)
Glucose, Bld: 93 mg/dL (ref 70–99)
Potassium: 3.8 mEq/L (ref 3.5–5.1)
Sodium: 135 mEq/L (ref 135–145)
Total Bilirubin: 0.9 mg/dL (ref 0.2–1.2)
Total Protein: 8.4 g/dL — ABNORMAL HIGH (ref 6.0–8.3)

## 2015-08-17 LAB — LIPID PANEL
Cholesterol: 144 mg/dL (ref 0–200)
HDL: 52.4 mg/dL (ref >39.00)
LDL Cholesterol: 75 mg/dL (ref 0–99)
NonHDL: 91.93
Total CHOL/HDL Ratio: 3
Triglycerides: 83 mg/dL (ref 0.0–149.0)
VLDL: 16.6 mg/dL (ref 0.0–40.0)

## 2015-08-17 LAB — MICROALBUMIN / CREATININE URINE RATIO
Creatinine,U: 111.4 mg/dL
Microalb Creat Ratio: 2.2 mg/g (ref 0.0–30.0)
Microalb, Ur: 2.5 mg/dL — ABNORMAL HIGH (ref 0.0–1.9)

## 2015-08-17 LAB — CBC WITH DIFFERENTIAL/PLATELET
Basophils Absolute: 0 10*3/uL (ref 0.0–0.1)
Basophils Relative: 0.5 % (ref 0.0–3.0)
Eosinophils Absolute: 0.2 10*3/uL (ref 0.0–0.7)
Eosinophils Relative: 2.8 % (ref 0.0–5.0)
HCT: 35.2 % — ABNORMAL LOW (ref 36.0–46.0)
Hemoglobin: 11.5 g/dL — ABNORMAL LOW (ref 12.0–15.0)
Lymphocytes Relative: 23.4 % (ref 12.0–46.0)
Lymphs Abs: 2 10*3/uL (ref 0.7–4.0)
MCHC: 32.6 g/dL (ref 30.0–36.0)
MCV: 79.7 fl (ref 78.0–100.0)
Monocytes Absolute: 0.6 10*3/uL (ref 0.1–1.0)
Monocytes Relative: 6.8 % (ref 3.0–12.0)
Neutro Abs: 5.6 10*3/uL (ref 1.4–7.7)
Neutrophils Relative %: 66.5 % (ref 43.0–77.0)
Platelets: 343 10*3/uL (ref 150.0–400.0)
RBC: 4.42 Mil/uL (ref 3.87–5.11)
RDW: 15.1 % (ref 11.5–15.5)
WBC: 8.5 10*3/uL (ref 4.0–10.5)

## 2015-08-17 LAB — TSH: TSH: 0.59 u[IU]/mL (ref 0.35–4.50)

## 2015-08-18 LAB — URINE CULTURE
Colony Count: NO GROWTH
Organism ID, Bacteria: NO GROWTH

## 2015-10-29 ENCOUNTER — Other Ambulatory Visit: Payer: Self-pay | Admitting: Family Medicine

## 2015-10-30 ENCOUNTER — Other Ambulatory Visit: Payer: Self-pay | Admitting: Family Medicine

## 2015-11-28 ENCOUNTER — Other Ambulatory Visit: Payer: Self-pay

## 2015-11-28 DIAGNOSIS — I10 Essential (primary) hypertension: Secondary | ICD-10-CM

## 2015-11-28 MED ORDER — CARVEDILOL 25 MG PO TABS: 25.0000 mg | ORAL_TABLET | Freq: Two times a day (BID) | ORAL | Status: DC

## 2015-11-28 MED ORDER — AMLODIPINE BESYLATE 5 MG PO TABS: 5.0000 mg | ORAL_TABLET | Freq: Every day | ORAL | Status: DC

## 2015-11-28 MED ORDER — FUROSEMIDE 20 MG PO TABS: 20.0000 mg | ORAL_TABLET | Freq: Every day | ORAL | Status: DC

## 2015-11-28 MED ORDER — ATORVASTATIN CALCIUM 20 MG PO TABS: ORAL_TABLET | ORAL | Status: DC

## 2016-01-10 ENCOUNTER — Telehealth: Payer: Self-pay

## 2016-01-10 MED ORDER — FLUOCINONIDE 0.05 % EX CREA: TOPICAL_CREAM | Freq: Two times a day (BID) | CUTANEOUS | 0 refills | Status: DC | PRN

## 2016-01-10 NOTE — Telephone Encounter (Signed)
Refill x1 

## 2016-01-10 NOTE — Telephone Encounter (Signed)
Received fax from [facility]. Pt requesting refill on Fluocinonide cream 0.05 %. Last Filled: 06/17/2013 #60g and 2RF. Please advise.

## 2016-01-10 NOTE — Telephone Encounter (Signed)
Rx sent 

## 2016-04-12 ENCOUNTER — Encounter: Payer: Self-pay | Admitting: Family Medicine

## 2016-04-26 ENCOUNTER — Other Ambulatory Visit: Payer: Self-pay | Admitting: Family Medicine

## 2016-06-12 ENCOUNTER — Other Ambulatory Visit: Payer: Self-pay | Admitting: Family Medicine

## 2016-06-13 ENCOUNTER — Encounter: Payer: Self-pay | Admitting: Family Medicine

## 2016-09-17 ENCOUNTER — Ambulatory Visit (INDEPENDENT_AMBULATORY_CARE_PROVIDER_SITE_OTHER): Payer: Managed Care, Other (non HMO) | Admitting: Family Medicine

## 2016-09-17 ENCOUNTER — Encounter: Payer: Self-pay | Admitting: Family Medicine

## 2016-09-17 VITALS — BP 122/80 | HR 72 | Temp 98.1°F | Resp 16 | Ht 67.0 in | Wt 271.6 lb

## 2016-09-17 DIAGNOSIS — Z0000 Encounter for general adult medical examination without abnormal findings: Secondary | ICD-10-CM | POA: Insufficient documentation

## 2016-09-17 DIAGNOSIS — I10 Essential (primary) hypertension: Secondary | ICD-10-CM | POA: Diagnosis not present

## 2016-09-17 DIAGNOSIS — E785 Hyperlipidemia, unspecified: Secondary | ICD-10-CM

## 2016-09-17 LAB — COMPREHENSIVE METABOLIC PANEL
ALT: 21 U/L (ref 0–35)
AST: 22 U/L (ref 0–37)
Albumin: 4.3 g/dL (ref 3.5–5.2)
Alkaline Phosphatase: 64 U/L (ref 39–117)
BUN: 10 mg/dL (ref 6–23)
CO2: 29 mEq/L (ref 19–32)
Calcium: 9.6 mg/dL (ref 8.4–10.5)
Chloride: 102 mEq/L (ref 96–112)
Creatinine, Ser: 0.71 mg/dL (ref 0.40–1.20)
GFR: 114.77 mL/min (ref >60.00)
Glucose, Bld: 125 mg/dL — ABNORMAL HIGH (ref 70–99)
Potassium: 3.9 mEq/L (ref 3.5–5.1)
Sodium: 137 mEq/L (ref 135–145)
Total Bilirubin: 1 mg/dL (ref 0.2–1.2)
Total Protein: 8.4 g/dL — ABNORMAL HIGH (ref 6.0–8.3)

## 2016-09-17 LAB — LIPID PANEL
Cholesterol: 146 mg/dL (ref 0–200)
HDL: 53.3 mg/dL (ref >39.00)
LDL Cholesterol: 73 mg/dL (ref 0–99)
NonHDL: 92.76
Total CHOL/HDL Ratio: 3
Triglycerides: 98 mg/dL (ref 0.0–149.0)
VLDL: 19.6 mg/dL (ref 0.0–40.0)

## 2016-09-17 LAB — CBC
HCT: 34.9 % — ABNORMAL LOW (ref 36.0–46.0)
Hemoglobin: 11.3 g/dL — ABNORMAL LOW (ref 12.0–15.0)
MCHC: 32.4 g/dL (ref 30.0–36.0)
MCV: 77.6 fl — ABNORMAL LOW (ref 78.0–100.0)
Platelets: 313 10*3/uL (ref 150.0–400.0)
RBC: 4.5 Mil/uL (ref 3.87–5.11)
RDW: 16.4 % — ABNORMAL HIGH (ref 11.5–15.5)
WBC: 6.8 10*3/uL (ref 4.0–10.5)

## 2016-09-17 MED ORDER — FUROSEMIDE 20 MG PO TABS: 20.0000 mg | ORAL_TABLET | Freq: Every day | ORAL | 1 refills | Status: DC

## 2016-09-17 MED ORDER — AMLODIPINE BESYLATE 5 MG PO TABS: 5.0000 mg | ORAL_TABLET | Freq: Every day | ORAL | 0 refills | Status: DC

## 2016-09-17 MED ORDER — FLUOCINONIDE 0.05 % EX CREA: TOPICAL_CREAM | Freq: Two times a day (BID) | CUTANEOUS | 0 refills | Status: DC | PRN

## 2016-09-17 MED ORDER — CARVEDILOL 25 MG PO TABS: 25.0000 mg | ORAL_TABLET | Freq: Two times a day (BID) | ORAL | 1 refills | Status: DC

## 2016-09-17 MED ORDER — ATORVASTATIN CALCIUM 20 MG PO TABS: ORAL_TABLET | ORAL | 1 refills | Status: DC

## 2016-09-17 MED ORDER — CARVEDILOL 25 MG PO TABS
25.0000 mg | ORAL_TABLET | Freq: Two times a day (BID) | ORAL | 1 refills | Status: DC
Start: 2016-09-17 — End: 2016-09-17

## 2016-09-17 NOTE — Assessment & Plan Note (Signed)
Tolerating statin, encouraged heart healthy diet, avoid trans fats, minimize simple carbs and saturated fats. Increase exercise as tolerated 

## 2016-09-17 NOTE — Assessment & Plan Note (Signed)
See AVS Check labs ghm utd Pt refuses flu vaccine

## 2016-09-17 NOTE — Progress Notes (Signed)
Pre visit review using our clinic review tool, if applicable. No additional management support is needed unless otherwise documented below in the visit note. 

## 2016-09-17 NOTE — Patient Instructions (Signed)
Preventive Care 18-39 Years, Female Preventive care refers to lifestyle choices and visits with your health care provider that can promote health and wellness. What does preventive care include?  A yearly physical exam. This is also called an annual well check.  Dental exams once or twice a year.  Routine eye exams. Ask your health care provider how often you should have your eyes checked.  Personal lifestyle choices, including:  Daily care of your teeth and gums.  Regular physical activity.  Eating a healthy diet.  Avoiding tobacco and drug use.  Limiting alcohol use.  Practicing safe sex.  Taking vitamin and mineral supplements as recommended by your health care provider. What happens during an annual well check? The services and screenings done by your health care provider during your annual well check will depend on your age, overall health, lifestyle risk factors, and family history of disease. Counseling  Your health care provider may ask you questions about your:  Alcohol use.  Tobacco use.  Drug use.  Emotional well-being.  Home and relationship well-being.  Sexual activity.  Eating habits.  Work and work environment.  Method of birth control.  Menstrual cycle.  Pregnancy history. Screening  You may have the following tests or measurements:  Height, weight, and BMI.  Diabetes screening. This is done by checking your blood sugar (glucose) after you have not eaten for a while (fasting).  Blood pressure.  Lipid and cholesterol levels. These may be checked every 5 years starting at age 20.  Skin check.  Hepatitis C blood test.  Hepatitis B blood test.  Sexually transmitted disease (STD) testing.  BRCA-related cancer screening. This may be done if you have a family history of breast, ovarian, tubal, or peritoneal cancers.  Pelvic exam and Pap test. This may be done every 3 years starting at age 21. Starting at age 30, this may be done every 5  years if you have a Pap test in combination with an HPV test. Discuss your test results, treatment options, and if necessary, the need for more tests with your health care provider. Vaccines  Your health care provider may recommend certain vaccines, such as:  Influenza vaccine. This is recommended every year.  Tetanus, diphtheria, and acellular pertussis (Tdap, Td) vaccine. You may need a Td booster every 10 years.  Varicella vaccine. You may need this if you have not been vaccinated.  HPV vaccine. If you are 26 or younger, you may need three doses over 6 months.  Measles, mumps, and rubella (MMR) vaccine. You may need at least one dose of MMR. You may also need a second dose.  Pneumococcal 13-valent conjugate (PCV13) vaccine. You may need this if you have certain conditions and were not previously vaccinated.  Pneumococcal polysaccharide (PPSV23) vaccine. You may need one or two doses if you smoke cigarettes or if you have certain conditions.  Meningococcal vaccine. One dose is recommended if you are age 19-21 years and a first-year college student living in a residence hall, or if you have one of several medical conditions. You may also need additional booster doses.  Hepatitis A vaccine. You may need this if you have certain conditions or if you travel or work in places where you may be exposed to hepatitis A.  Hepatitis B vaccine. You may need this if you have certain conditions or if you travel or work in places where you may be exposed to hepatitis B.  Haemophilus influenzae type b (Hib) vaccine. You may need this   if you have certain risk factors. Talk to your health care provider about which screenings and vaccines you need and how often you need them. This information is not intended to replace advice given to you by your health care provider. Make sure you discuss any questions you have with your health care provider. Document Released: 07/31/2001 Document Revised: 02/22/2016  Document Reviewed: 04/05/2015 Elsevier Interactive Patient Education  2017 [employer].

## 2016-09-17 NOTE — Assessment & Plan Note (Signed)
Well controlled, no changes to meds. Encouraged heart healthy diet such as the DASH diet and exercise as tolerated.  °

## 2016-09-17 NOTE — Progress Notes (Signed)
Subjective:  I acted as a Neurosurgeon for Dr. Delman Kitten, LPN    Patient ID: Michele Tran, female    DOB: [birth year], 46 y.o.   MRN: [id number]  Chief Complaint  Patient presents with  . Annual Exam    fasting  . Hypertension    follow up  . Hyperlipidemia    follow up    Hypertension  This is a chronic problem. The current episode started more than 1 year ago. Pertinent negatives include no blurred vision, chest pain, headaches, malaise/fatigue, palpitations or shortness of breath.  Hyperlipidemia  This is a chronic problem. The current episode started more than 1 year ago. Pertinent negatives include no chest pain, myalgias or shortness of breath.    Patient is in today for annual physical, follow up hypertension, and hyperlipidemia. Patient report she have a skin tag and it becoming irritation, starting to get caught on her clothing. Patient report blood pressure has been running a little high since October 2017.  Patient Care Team: Donato Schultz, DO as PCP - General   Past Medical History:  Diagnosis Date  . Abdominal bloating   . Hirsutism   . Hyperlipidemia   . Hypertension   . Obesity, morbid (HCC)   . Polycystic ovaries     Past Surgical History:  Procedure Laterality Date  . UTERINE polyps  05-31-10   D &C  [facility]    Family History  Problem Relation Age of Onset  . Arthritis    . Diabetes      1st degree relative  . Hyperlipidemia    . Hypertension    . Lung cancer Paternal Aunt   . Cancer Paternal Aunt     breast, lung  . Breast cancer Paternal Aunt   . Stroke Mother 45    1st degree relative   . Coronary artery disease Paternal Uncle     late 58s  . Leukemia Paternal Uncle   . Cancer Father 51    advanced head , neck and throat ca    Social History   Social History  . Marital status: Single    Spouse name: N/A  . Number of children: N/A  . Years of education: N/A   Occupational History  . [employer]    Social History Main Topics  . Smoking status: Never Smoker  . Smokeless tobacco: Never Used  . Alcohol use No  . Drug use: No  . Sexual activity: Yes    Partners: Male   Other Topics Concern  . Not on file   Social History Narrative   Exercise-- walking     Outpatient Medications Prior to Visit  Medication Sig Dispense Refill  . amLODipine (NORVASC) 5 MG tablet TAKE 1 TABLET BY MOUTH DAILY 90 tablet 0  . atorvastatin (LIPITOR) 20 MG tablet TAKE 1 TABLET DAILY 90 tablet 1  . carvedilol (COREG) 25 MG tablet Take 1 tablet (25 mg total) by mouth 2 (two) times daily with a meal. 180 tablet 1  . fluocinonide cream (LIDEX) 0.05 % Apply topically 2 (two) times daily as needed. 60 g 0  . furosemide (LASIX) 20 MG tablet Take 1 tablet (20 mg total) by mouth daily. 90 tablet 1  . Lorcaserin HCl (BELVIQ) 10 MG TABS 1 po bid 30 tablet 0   No facility-administered medications prior to visit.     Allergies  Allergen Reactions  . Ace Inhibitors Swelling    Swelling  . Codeine Nausea  And Vomiting  . Fluviral [Flu Virus Vaccine] Other (See Comments)    Asthma exacerabation    Review of Systems  Constitutional: Negative for chills, fever and malaise/fatigue.  HENT: Negative for congestion and hearing loss.   Eyes: Negative for blurred vision and discharge.  Respiratory: Negative for cough, sputum production and shortness of breath.   Cardiovascular: Negative for chest pain, palpitations and leg swelling.  Gastrointestinal: Negative for abdominal pain, blood in stool, constipation, diarrhea, heartburn, nausea and vomiting.  Genitourinary: Negative for dysuria, frequency, hematuria and urgency.  Musculoskeletal: Negative for back pain, falls and myalgias.  Skin: Negative for rash.  Neurological: Negative for dizziness, sensory change, loss of consciousness, weakness and headaches.  Endo/Heme/Allergies: Negative for environmental allergies. Does not bruise/bleed easily.   Psychiatric/Behavioral: Negative for depression and suicidal ideas. The patient is not nervous/anxious and does not have insomnia.        Objective:    Physical Exam  Constitutional: She is oriented to person, place, and time. She appears well-developed and well-nourished. No distress.  HENT:  Head: Normocephalic and atraumatic.  Right Ear: External ear normal.  Left Ear: External ear normal.  Nose: Nose normal.  Mouth/Throat: Oropharynx is clear and moist.  Eyes: Conjunctivae and EOM are normal. Pupils are equal, round, and reactive to light.  Neck: Normal range of motion. Neck supple. No thyromegaly present.  Cardiovascular: Normal rate, regular rhythm and normal heart sounds.   No murmur heard. Pulmonary/Chest: Effort normal and breath sounds normal. No respiratory distress. She has no wheezes. She has no rales. She exhibits no tenderness.  Abdominal: Soft. Bowel sounds are normal. There is no tenderness.  Musculoskeletal: Normal range of motion. She exhibits no edema or deformity.  Neurological: She is alert and oriented to person, place, and time.  Skin: Skin is warm and dry. She is not diaphoretic.  Psychiatric: She has a normal mood and affect. Her behavior is normal. Judgment and thought content normal.  Nursing note and vitals reviewed.   BP 122/80 (BP Location: Left Arm, Patient Position: Sitting, Cuff Size: Large)   Pulse 72   Temp 98.1 F (36.7 C) (Oral)   Resp 16   Ht  (1.702 m)   Wt 271 lb 9.6 oz (123.2 kg)   LMP 08/23/2016   SpO2 98%   BMI 42.54 kg/m  Wt Readings from Last 3 Encounters:  09/17/16 271 lb 9.6 oz (123.2 kg)  08/16/15 278 lb (126.1 kg)  06/24/15 276 lb (125.2 kg)   BP Readings from Last 3 Encounters:  09/17/16 122/80  08/16/15 124/86  06/24/15 (!) 143/83     Immunization History  Administered Date(s) Administered  . Td 04/04/2010    Health Maintenance  Topic Date Due  . MAMMOGRAM  02/10/2016  . INFLUENZA VACCINE  01/16/2017   . PAP SMEAR  02/09/2018  . TETANUS/TDAP  04/04/2020  . HIV Screening  Completed    Lab Results  Component Value Date   WBC 8.5 08/16/2015   HGB 11.5 (L) 08/16/2015   HCT 35.2 (L) 08/16/2015   PLT 343.0 08/16/2015   GLUCOSE 93 08/16/2015   CHOL 144 08/16/2015   TRIG 83.0 08/16/2015   HDL 52.40 08/16/2015   LDLDIRECT 149.6 03/26/2013   LDLCALC 75 08/16/2015   ALT 21 08/16/2015   AST 23 08/16/2015   NA 135 08/16/2015   K 3.8 08/16/2015   CL 101 08/16/2015   CREATININE 0.73 08/16/2015   BUN 10 08/16/2015   CO2 27 08/16/2015  TSH 0.59 08/16/2015   INR 1.01 04/10/2011   HGBA1C 6.3 05/08/2011   MICROALBUR 2.5 (H) 08/16/2015    Lab Results  Component Value Date   TSH 0.59 08/16/2015   Lab Results  Component Value Date   WBC 8.5 08/16/2015   HGB 11.5 (L) 08/16/2015   HCT 35.2 (L) 08/16/2015   MCV 79.7 08/16/2015   PLT 343.0 08/16/2015   Lab Results  Component Value Date   NA 135 08/16/2015   K 3.8 08/16/2015   CO2 27 08/16/2015   GLUCOSE 93 08/16/2015   BUN 10 08/16/2015   CREATININE 0.73 08/16/2015   BILITOT 0.9 08/16/2015   ALKPHOS 56 08/16/2015   AST 23 08/16/2015   ALT 21 08/16/2015   PROT 8.4 (H) 08/16/2015   ALBUMIN 4.3 08/16/2015   CALCIUM 9.4 08/16/2015   GFR 111.71 08/16/2015   Lab Results  Component Value Date   CHOL 144 08/16/2015   Lab Results  Component Value Date   HDL 52.40 08/16/2015   Lab Results  Component Value Date   LDLCALC 75 08/16/2015   Lab Results  Component Value Date   TRIG 83.0 08/16/2015   Lab Results  Component Value Date   CHOLHDL 3 08/16/2015   Lab Results  Component Value Date   HGBA1C 6.3 05/08/2011         Assessment & Plan:   Problem List Items Addressed This Visit      Unprioritized   Essential hypertension     Well controlled, no changes to meds. Encouraged heart healthy diet such as the DASH diet and exercise as tolerated.       Relevant Medications   atorvastatin (LIPITOR) 20 MG tablet    furosemide (LASIX) 20 MG tablet   amLODipine (NORVASC) 5 MG tablet   carvedilol (COREG) 25 MG tablet   Other Relevant Orders   CBC   Comprehensive metabolic panel   Hyperlipidemia LDL goal <100    Tolerating statin, encouraged heart healthy diet, avoid trans fats, minimize simple carbs and saturated fats. Increase exercise as tolerated      Relevant Medications   atorvastatin (LIPITOR) 20 MG tablet   furosemide (LASIX) 20 MG tablet   amLODipine (NORVASC) 5 MG tablet   carvedilol (COREG) 25 MG tablet   Other Relevant Orders   Lipid panel   Preventative health care - Primary    See AVS Check labs ghm utd Pt refuses flu vaccine      Relevant Orders   MM SCREENING BREAST TOMO BILATERAL      I have discontinued Ms. Spiewak's Lorcaserin HCl. I am also having her maintain her fluocinonide cream, atorvastatin, furosemide, amLODipine, and carvedilol.  Meds ordered this encounter  Medications  . DISCONTD: amLODipine (NORVASC) 5 MG tablet    Sig: Take 1 tablet (5 mg total) by mouth daily.    Dispense:  90 tablet    Refill:  0    PATIENT WILL NEED TO BE SEEN BY THE PROVIDER BEFORE ANY ADDITIONAL REFILLS WILL BE GIVEN.  . DISCONTD: carvedilol (COREG) 25 MG tablet    Sig: Take 1 tablet (25 mg total) by mouth 2 (two) times daily with a meal.    Dispense:  180 tablet    Refill:  1  . fluocinonide cream (LIDEX) 0.05 %    Sig: Apply topically 2 (two) times daily as needed.    Dispense:  60 g    Refill:  0  . atorvastatin (LIPITOR) 20 MG tablet  Sig: TAKE 1 TABLET DAILY    Dispense:  90 tablet    Refill:  1  . furosemide (LASIX) 20 MG tablet    Sig: Take 1 tablet (20 mg total) by mouth daily.    Dispense:  90 tablet    Refill:  1  . amLODipine (NORVASC) 5 MG tablet    Sig: Take 1 tablet (5 mg total) by mouth daily.    Dispense:  90 tablet    Refill:  0  . carvedilol (COREG) 25 MG tablet    Sig: Take 1 tablet (25 mg total) by mouth 2 (two) times daily with a meal.     Dispense:  180 tablet    Refill:  1    CMA served as scribe during this visit. History, Physical and Plan performed by medical provider. Documentation and orders reviewed and attested to.  Donato Schultz, DO   Patient ID: Michele Tran, female   DOB: 1970-12-18, 46 y.o.   MRN: [id number]

## 2016-09-24 ENCOUNTER — Other Ambulatory Visit (INDEPENDENT_AMBULATORY_CARE_PROVIDER_SITE_OTHER): Payer: Managed Care, Other (non HMO)

## 2016-09-24 ENCOUNTER — Ambulatory Visit (HOSPITAL_BASED_OUTPATIENT_CLINIC_OR_DEPARTMENT_OTHER)
Admission: RE | Admit: 2016-09-24 | Discharge: 2016-09-24 | Disposition: A | Discharge status: Home / Self Care | Payer: Managed Care, Other (non HMO) | Source: Ambulatory Visit | Attending: Family Medicine | Admitting: Family Medicine

## 2016-09-24 DIAGNOSIS — Z1231 Encounter for screening mammogram for malignant neoplasm of breast: Secondary | ICD-10-CM | POA: Insufficient documentation

## 2016-09-24 DIAGNOSIS — R7309 Other abnormal glucose: Secondary | ICD-10-CM

## 2016-09-24 DIAGNOSIS — Z0000 Encounter for general adult medical examination without abnormal findings: Secondary | ICD-10-CM

## 2016-09-24 LAB — HEMOGLOBIN A1C: Hgb A1c MFr Bld: 7.5 % — ABNORMAL HIGH (ref 4.6–6.5)

## 2016-11-04 ENCOUNTER — Other Ambulatory Visit: Payer: Self-pay | Admitting: Family Medicine

## 2016-12-03 ENCOUNTER — Other Ambulatory Visit: Payer: Self-pay | Admitting: Family Medicine

## 2016-12-27 ENCOUNTER — Other Ambulatory Visit: Payer: Self-pay | Admitting: Family Medicine

## 2016-12-27 DIAGNOSIS — I10 Essential (primary) hypertension: Secondary | ICD-10-CM

## 2017-01-21 ENCOUNTER — Other Ambulatory Visit: Payer: Self-pay | Admitting: Family Medicine

## 2017-01-21 NOTE — Telephone Encounter (Signed)
Rx filled per patient request. LB

## 2017-03-21 DIAGNOSIS — L0211 Cutaneous abscess of neck: Secondary | ICD-10-CM | POA: Diagnosis not present

## 2017-04-04 DIAGNOSIS — L0211 Cutaneous abscess of neck: Secondary | ICD-10-CM | POA: Diagnosis not present

## 2017-05-02 ENCOUNTER — Ambulatory Visit: Payer: BLUE CROSS/BLUE SHIELD | Admitting: Family Medicine

## 2017-05-02 ENCOUNTER — Encounter: Payer: Self-pay | Admitting: Family Medicine

## 2017-05-02 VITALS — BP 138/77 | HR 76 | Temp 97.9°F | Resp 16 | Ht 65.5 in

## 2017-05-02 DIAGNOSIS — E785 Hyperlipidemia, unspecified: Secondary | ICD-10-CM

## 2017-05-02 DIAGNOSIS — I10 Essential (primary) hypertension: Secondary | ICD-10-CM

## 2017-05-02 LAB — COMPREHENSIVE METABOLIC PANEL
ALT: 23 U/L (ref 0–35)
AST: 25 U/L (ref 0–37)
Albumin: 4.2 g/dL (ref 3.5–5.2)
Alkaline Phosphatase: 52 U/L (ref 39–117)
BUN: 10 mg/dL (ref 6–23)
CO2: 29 mEq/L (ref 19–32)
Calcium: 9.9 mg/dL (ref 8.4–10.5)
Chloride: 98 mEq/L (ref 96–112)
Creatinine, Ser: 0.72 mg/dL (ref 0.40–1.20)
GFR: 112.61 mL/min (ref >60.00)
Glucose, Bld: 115 mg/dL — ABNORMAL HIGH (ref 70–99)
Potassium: 3.9 mEq/L (ref 3.5–5.1)
Sodium: 135 mEq/L (ref 135–145)
Total Bilirubin: 0.9 mg/dL (ref 0.2–1.2)
Total Protein: 8.4 g/dL — ABNORMAL HIGH (ref 6.0–8.3)

## 2017-05-02 LAB — LIPID PANEL
Cholesterol: 220 mg/dL — ABNORMAL HIGH (ref 0–200)
HDL: 61.9 mg/dL (ref >39.00)
LDL Cholesterol: 127 mg/dL — ABNORMAL HIGH (ref 0–99)
NonHDL: 158.36
Total CHOL/HDL Ratio: 4
Triglycerides: 156 mg/dL — ABNORMAL HIGH (ref 0.0–149.0)
VLDL: 31.2 mg/dL (ref 0.0–40.0)

## 2017-05-02 MED ORDER — CARVEDILOL 25 MG PO TABS: 25.0000 mg | ORAL_TABLET | Freq: Two times a day (BID) | ORAL | 1 refills | Status: DC

## 2017-05-02 MED ORDER — FUROSEMIDE 20 MG PO TABS: 20.0000 mg | ORAL_TABLET | Freq: Every day | ORAL | 1 refills | Status: DC

## 2017-05-02 MED ORDER — AMLODIPINE BESYLATE 5 MG PO TABS: 5.0000 mg | ORAL_TABLET | Freq: Every day | ORAL | 3 refills | Status: DC

## 2017-05-02 MED ORDER — ATORVASTATIN CALCIUM 20 MG PO TABS: ORAL_TABLET | ORAL | 3 refills | Status: DC

## 2017-05-02 NOTE — Assessment & Plan Note (Signed)
Tolerating statin, encouraged heart healthy diet, avoid trans fats, minimize simple carbs and saturated fats. Increase exercise as tolerated--- she has been off the statin for several months

## 2017-05-02 NOTE — Patient Instructions (Signed)

## 2017-05-02 NOTE — Progress Notes (Signed)
Patient ID: Michele Tran, female  [name]  DOB: 06/25/1970  Age: 46 y.o. MRN: [id number]    Subjective:  Subjective  HPI Michele Tran presents for f/u bp and cholesterol --- no complaints.   Review of Systems  Constitutional: Negative for appetite change, diaphoresis, fatigue and unexpected weight change.  Eyes: Negative for pain, redness and visual disturbance.  Respiratory: Negative for cough, chest tightness, shortness of breath and wheezing.   Cardiovascular: Negative for chest pain, palpitations and leg swelling.  Endocrine: Negative for cold intolerance, heat intolerance, polydipsia, polyphagia and polyuria.  Genitourinary: Negative for difficulty urinating, dysuria and frequency.  Neurological: Negative for dizziness, light-headedness, numbness and headaches.    History Past Medical History:  Diagnosis Date  . Abdominal bloating   . Hirsutism   . Hyperlipidemia   . Hypertension   . Obesity, morbid (HCC)   . Polycystic ovaries     She has a past surgical history that includes UTERINE polyps (05-31-10).   Her family history includes Arthritis in her unknown relative; Breast cancer in her paternal aunt; Cancer in her paternal aunt; Cancer (age of onset: 3178) in her father; Coronary artery disease in her paternal uncle; Diabetes in her unknown relative; Hyperlipidemia in her unknown relative; Hypertension in her unknown relative; Leukemia in her paternal uncle; Lung cancer in her paternal aunt; Stroke (age of onset: 3471) in her mother.She reports that  has never smoked. she has never used smokeless tobacco. She reports that she does not drink alcohol or use drugs.  Current Outpatient Medications on File Prior to Visit  Medication Sig Dispense Refill  . fluocinonide cream (LIDEX) 0.05 % Apply topically 2 (two) times daily as needed. 60 g 0   No current facility-administered medications on file prior to visit.      Objective:  Objective  Physical Exam  Constitutional: She is  oriented to person, place, and time. She appears well-developed and well-nourished.  HENT:  Head: Normocephalic and atraumatic.  Eyes: Conjunctivae and EOM are normal.  Neck: Normal range of motion. Neck supple. No JVD present. Carotid bruit is not present. No thyromegaly present.  Cardiovascular: Normal rate, regular rhythm and normal heart sounds.  No murmur heard. Pulmonary/Chest: Effort normal and breath sounds normal. No respiratory distress. She has no wheezes. She has no rales. She exhibits no tenderness.  Musculoskeletal: She exhibits no edema.  Neurological: She is alert and oriented to person, place, and time.  Psychiatric: She has a normal mood and affect. Her behavior is normal. Judgment and thought content normal.  Nursing note and vitals reviewed.  BP 138/77 (BP Location: Right Arm, Cuff Size: Large)   Pulse 76   Temp 97.9 F (36.6 C) (Oral)   Resp 16   Ht 5' 5.5" (1.664 m)   LMP 03/23/2017   SpO2 99%   BMI 44.51 kg/m  Wt Readings from Last 3 Encounters:  09/17/16 271 lb 9.6 oz (123.2 kg)  08/16/15 278 lb (126.1 kg)  06/24/15 276 lb (125.2 kg)     Lab Results  Component Value Date   WBC 6.8 09/17/2016   HGB 11.3 (L) 09/17/2016   HCT 34.9 (L) 09/17/2016   PLT 313.0 09/17/2016   GLUCOSE 125 (H) 09/17/2016   CHOL 146 09/17/2016   TRIG 98.0 09/17/2016   HDL 53.30 09/17/2016   LDLDIRECT 149.6 03/26/2013   LDLCALC 73 09/17/2016   ALT 21 09/17/2016   AST 22 09/17/2016   NA 137 09/17/2016   K 3.9 09/17/2016  CL 102 09/17/2016   CREATININE 0.71 09/17/2016   BUN 10 09/17/2016   CO2 29 09/17/2016   TSH 0.59 08/16/2015   INR 1.01 04/10/2011   HGBA1C 7.5 (H) 09/24/2016   MICROALBUR 2.5 (H) 08/16/2015    Mm Screening Breast Tomo Bilateral  Result Date: 09/24/2016 CLINICAL DATA:  Screening. EXAM: 2D DIGITAL SCREENING BILATERAL MAMMOGRAM WITH CAD AND ADJUNCT TOMO COMPARISON:  Previous exam(s). ACR Breast Density Category b: There are scattered areas of  fibroglandular density. FINDINGS: There are no findings suspicious for malignancy. Images were processed with CAD. IMPRESSION: No mammographic evidence of malignancy. A result letter of this screening mammogram will be mailed directly to the patient. RECOMMENDATION: Screening mammogram in one year. (Code:SM-B-01Y) BI-RADS CATEGORY  1: Negative. Electronically Signed   By: Baird Lyonsina  Arceo M.D.   On: 09/24/2016 16:09     Assessment & Plan:  Plan  I have discontinued Quanita R. Derick's amLODipine and furosemide. I have also changed her amLODipine, carvedilol, and furosemide. Additionally, I am having her maintain her fluocinonide cream and atorvastatin.  Meds ordered this encounter  Medications  . atorvastatin (LIPITOR) 20 MG tablet    Sig: TAKE 1 TABLET DAILY    Dispense:  90 tablet    Refill:  3  . amLODipine (NORVASC) 5 MG tablet    Sig: Take 1 tablet (5 mg total) daily by mouth.    Dispense:  90 tablet    Refill:  3  . carvedilol (COREG) 25 MG tablet    Sig: Take 1 tablet (25 mg total) 2 (two) times daily with a meal by mouth.    Dispense:  180 tablet    Refill:  1  . furosemide (LASIX) 20 MG tablet    Sig: Take 1 tablet (20 mg total) daily by mouth.    Dispense:  90 tablet    Refill:  1    Problem List Items Addressed This Visit      Unprioritized   Essential hypertension    Well controlled, no changes to meds. Encouraged heart healthy diet such as the DASH diet and exercise as tolerated.       Relevant Medications   atorvastatin (LIPITOR) 20 MG tablet   amLODipine (NORVASC) 5 MG tablet   carvedilol (COREG) 25 MG tablet   furosemide (LASIX) 20 MG tablet   Other Relevant Orders   Comprehensive metabolic panel   Lipid panel   Hyperlipidemia LDL goal <100 - Primary    Tolerating statin, encouraged heart healthy diet, avoid trans fats, minimize simple carbs and saturated fats. Increase exercise as tolerated--- she has been off the statin for several months      Relevant  Medications   atorvastatin (LIPITOR) 20 MG tablet   amLODipine (NORVASC) 5 MG tablet   carvedilol (COREG) 25 MG tablet   furosemide (LASIX) 20 MG tablet   Other Relevant Orders   Comprehensive metabolic panel   Lipid panel      Follow-up: Return in about 6 months (around 10/30/2017) for hypertension, hyperlipidemia, annual exam, fasting.  Donato SchultzYvonne R Lowne Chase, DO

## 2017-05-02 NOTE — Assessment & Plan Note (Signed)
Well controlled, no changes to meds. Encouraged heart healthy diet such as the DASH diet and exercise as tolerated.  °

## 2017-05-08 ENCOUNTER — Other Ambulatory Visit: Payer: Self-pay | Admitting: *Deleted

## 2017-05-08 DIAGNOSIS — I10 Essential (primary) hypertension: Secondary | ICD-10-CM

## 2017-05-08 DIAGNOSIS — E785 Hyperlipidemia, unspecified: Secondary | ICD-10-CM

## 2017-05-08 MED ORDER — ATORVASTATIN CALCIUM 40 MG PO TABS: 40.0000 mg | ORAL_TABLET | Freq: Every day | ORAL | 2 refills | Status: DC

## 2017-06-12 ENCOUNTER — Telehealth: Payer: Self-pay

## 2017-06-12 DIAGNOSIS — J0110 Acute frontal sinusitis, unspecified: Secondary | ICD-10-CM

## 2017-06-12 DIAGNOSIS — I10 Essential (primary) hypertension: Secondary | ICD-10-CM

## 2017-06-12 MED ORDER — FUROSEMIDE 20 MG PO TABS: 20.0000 mg | ORAL_TABLET | Freq: Every day | ORAL | 1 refills | Status: DC

## 2017-06-12 MED ORDER — CARVEDILOL 25 MG PO TABS: 25.0000 mg | ORAL_TABLET | Freq: Two times a day (BID) | ORAL | 1 refills | Status: DC

## 2017-06-12 MED ORDER — FLUTICASONE PROPIONATE 50 MCG/ACT NA SUSP: 2.0000 | Freq: Every day | NASAL | 5 refills | Status: DC | PRN

## 2017-06-12 MED ORDER — AMLODIPINE BESYLATE 5 MG PO TABS: 5.0000 mg | ORAL_TABLET | Freq: Every day | ORAL | 1 refills | Status: DC

## 2017-06-12 MED ORDER — ATORVASTATIN CALCIUM 40 MG PO TABS: 40.0000 mg | ORAL_TABLET | Freq: Every day | ORAL | 1 refills | Status: DC

## 2017-06-12 NOTE — Telephone Encounter (Signed)
Copied from CRM [id number]. Topic: General - Other >> Jun 12, 2017 10:50 AM [name], [name], RMA wrote: Reason for CRM: pt would like a refill on her lasix 20mg  tab and lipitor 40 mg amlodipine 5 mg coreg25 mg  and flonase sent her pharmacy [facility] on [address]

## 2017-06-12 NOTE — Telephone Encounter (Signed)
Requested Rx's sent.  

## 2017-08-12 ENCOUNTER — Other Ambulatory Visit (INDEPENDENT_AMBULATORY_CARE_PROVIDER_SITE_OTHER): Payer: BLUE CROSS/BLUE SHIELD

## 2017-08-12 DIAGNOSIS — I10 Essential (primary) hypertension: Secondary | ICD-10-CM | POA: Diagnosis not present

## 2017-08-12 DIAGNOSIS — E785 Hyperlipidemia, unspecified: Secondary | ICD-10-CM

## 2017-08-12 LAB — COMPREHENSIVE METABOLIC PANEL
ALT: 23 U/L (ref 0–35)
AST: 21 U/L (ref 0–37)
Albumin: 4.3 g/dL (ref 3.5–5.2)
Alkaline Phosphatase: 70 U/L (ref 39–117)
BUN: 12 mg/dL (ref 6–23)
CO2: 28 mEq/L (ref 19–32)
Calcium: 10.1 mg/dL (ref 8.4–10.5)
Chloride: 98 mEq/L (ref 96–112)
Creatinine, Ser: 0.7 mg/dL (ref 0.40–1.20)
GFR: 116.19 mL/min (ref >60.00)
Glucose, Bld: 165 mg/dL — ABNORMAL HIGH (ref 70–99)
Potassium: 4 mEq/L (ref 3.5–5.1)
Sodium: 135 mEq/L (ref 135–145)
Total Bilirubin: 1.1 mg/dL (ref 0.2–1.2)
Total Protein: 8.4 g/dL — ABNORMAL HIGH (ref 6.0–8.3)

## 2017-08-12 LAB — LIPID PANEL
Cholesterol: 143 mg/dL (ref 0–200)
HDL: 54.1 mg/dL (ref >39.00)
LDL Cholesterol: 66 mg/dL (ref 0–99)
NonHDL: 88.57
Total CHOL/HDL Ratio: 3
Triglycerides: 114 mg/dL (ref 0.0–149.0)
VLDL: 22.8 mg/dL (ref 0.0–40.0)

## 2017-08-13 ENCOUNTER — Other Ambulatory Visit: Payer: Self-pay | Admitting: *Deleted

## 2017-08-13 DIAGNOSIS — R739 Hyperglycemia, unspecified: Secondary | ICD-10-CM

## 2017-08-13 NOTE — Progress Notes (Signed)
mp

## 2017-08-20 ENCOUNTER — Other Ambulatory Visit (INDEPENDENT_AMBULATORY_CARE_PROVIDER_SITE_OTHER): Payer: BLUE CROSS/BLUE SHIELD

## 2017-08-20 DIAGNOSIS — R739 Hyperglycemia, unspecified: Secondary | ICD-10-CM | POA: Diagnosis not present

## 2017-08-20 LAB — HEMOGLOBIN A1C: Hgb A1c MFr Bld: 8.5 % — ABNORMAL HIGH (ref 4.6–6.5)

## 2017-08-20 LAB — BASIC METABOLIC PANEL
BUN: 10 mg/dL (ref 6–23)
CO2: 28 mEq/L (ref 19–32)
Calcium: 9.8 mg/dL (ref 8.4–10.5)
Chloride: 99 mEq/L (ref 96–112)
Creatinine, Ser: 0.79 mg/dL (ref 0.40–1.20)
GFR: 101.04 mL/min (ref >60.00)
Glucose, Bld: 169 mg/dL — ABNORMAL HIGH (ref 70–99)
Potassium: 3.9 mEq/L (ref 3.5–5.1)
Sodium: 135 mEq/L (ref 135–145)

## 2017-08-22 ENCOUNTER — Other Ambulatory Visit: Payer: Self-pay | Admitting: *Deleted

## 2017-08-22 ENCOUNTER — Telehealth: Payer: Self-pay | Admitting: *Deleted

## 2017-08-22 DIAGNOSIS — E119 Type 2 diabetes mellitus without complications: Secondary | ICD-10-CM

## 2017-08-22 MED ORDER — METFORMIN HCL ER 500 MG PO TB24: 500.0000 mg | ORAL_TABLET | Freq: Every evening | ORAL | 2 refills | Status: DC

## 2017-08-22 MED ORDER — BLOOD GLUCOSE METER KIT: PACK | 0 refills | Status: DC

## 2017-08-22 MED ORDER — LANCETS MISC: 1 refills | Status: DC

## 2017-08-22 MED ORDER — GLUCOSE BLOOD VI STRP: ORAL_STRIP | 1 refills | Status: DC

## 2017-08-22 NOTE — Telephone Encounter (Signed)
mychart message sent

## 2017-08-29 ENCOUNTER — Encounter: Payer: Self-pay | Admitting: Family Medicine

## 2017-09-04 ENCOUNTER — Encounter: Payer: Self-pay | Admitting: *Deleted

## 2017-10-28 ENCOUNTER — Encounter: Payer: Self-pay | Admitting: Family Medicine

## 2017-10-28 NOTE — Telephone Encounter (Signed)
She would need to be seen

## 2017-10-31 ENCOUNTER — Encounter: Payer: Self-pay | Admitting: Family Medicine

## 2017-10-31 ENCOUNTER — Ambulatory Visit: Payer: BLUE CROSS/BLUE SHIELD | Admitting: Family Medicine

## 2017-10-31 VITALS — BP 132/70 | HR 84 | Temp 98.4°F | Resp 16 | Ht 65.35 in | Wt 275.6 lb

## 2017-10-31 DIAGNOSIS — W57XXXA Bitten or stung by nonvenomous insect and other nonvenomous arthropods, initial encounter: Secondary | ICD-10-CM | POA: Diagnosis not present

## 2017-10-31 DIAGNOSIS — S80862A Insect bite (nonvenomous), left lower leg, initial encounter: Secondary | ICD-10-CM

## 2017-10-31 MED ORDER — DOXYCYCLINE HYCLATE 100 MG PO TABS: 100.0000 mg | ORAL_TABLET | Freq: Two times a day (BID) | ORAL | 0 refills | Status: DC

## 2017-10-31 MED FILL — DOXYCYCLINE HYCLATE 100 MG: 100 | 10 days supply | Qty: 20 | Fill #0

## 2017-10-31 NOTE — Patient Instructions (Signed)

## 2017-10-31 NOTE — Progress Notes (Signed)
Subjective:  I acted as a Education administrator for [employer]. Yancey Flemings, [place]   Patient ID: Michele Tran, female    DOB: 1970/12/18, 47 y.o.   MRN: [id number]  Chief Complaint  Patient presents with  . Insect Bite    3 weeks ago    HPI  Patient is in today for insect bite. -- she took a tick off of her L ankle about 3 weeks ago .  She now has redness in area and red spot mid shin about 1 cm diameter.  Spot is sore but not itchy.   No other symptoms   Patient Care Team: Carollee Herter, Alferd Apa, DO as PCP - General   Past Medical History:  Diagnosis Date  . Abdominal bloating   . Hirsutism   . Hyperlipidemia   . Hypertension   . Obesity, morbid ([place])   . Polycystic ovaries     Past Surgical History:  Procedure Laterality Date  . UTERINE polyps  05-31-10   D &C  [facility]    Family History  Problem Relation Age of Onset  . Arthritis Unknown   . Diabetes Unknown        1st degree relative  . Hyperlipidemia Unknown   . Hypertension Unknown   . Lung cancer Paternal Aunt   . Cancer Paternal Aunt        breast, lung  . Breast cancer Paternal Aunt   . Stroke Mother 48       1st degree relative   . Coronary artery disease Paternal Uncle        late 59s  . Leukemia Paternal Uncle   . Cancer Father 70       advanced head , neck and throat ca    Social History   Socioeconomic History  . Marital status: Single    Spouse name: Not on file  . Number of children: Not on file  . Years of education: Not on file  . Highest education level: Not on file  Occupational History  . Occupation: [employer]    Employer: [employer]  Social Needs  . Financial resource strain: Not on file  . Food insecurity:    Worry: Not on file    Inability: Not on file  . Transportation needs:    Medical: Not on file    Non-medical: Not on file  Tobacco Use  . Smoking status: Never Smoker  . Smokeless tobacco: Never Used  Substance and Sexual Activity  . Alcohol use: No  . Drug use:  No  . Sexual activity: Yes    Partners: Male  Lifestyle  . Physical activity:    Days per week: Not on file    Minutes per session: Not on file  . Stress: Not on file  Relationships  . Social connections:    Talks on phone: Not on file    Gets together: Not on file    Attends religious service: Not on file    Active member of club or organization: Not on file    Attends meetings of clubs or organizations: Not on file    Relationship status: Not on file  . Intimate partner violence:    Fear of current or ex partner: Not on file    Emotionally abused: Not on file    Physically abused: Not on file    Forced sexual activity: Not on file  Other Topics Concern  . Not on file  Social History Narrative   Exercise-- walking  Outpatient Medications Prior to Visit  Medication Sig Dispense Refill  . amLODipine (NORVASC) 5 MG tablet Take 1 tablet (5 mg total) by mouth daily. 90 tablet 1  . atorvastatin (LIPITOR) 40 MG tablet Take 1 tablet (40 mg total) by mouth daily. 90 tablet 1  . blood glucose meter kit and supplies Dispense based on patient and insurance preference.  Use as directed once a day.  Dx code: E11.9 1 each 0  . carvedilol (COREG) 25 MG tablet Take 1 tablet (25 mg total) by mouth 2 (two) times daily with a meal. 180 tablet 1  . fluocinonide cream (LIDEX) 0.05 % Apply topically 2 (two) times daily as needed. 60 g 0  . fluticasone (FLONASE) 50 MCG/ACT nasal spray Place 2 sprays into both nostrils daily as needed for allergies or rhinitis. 16 g 5  . furosemide (LASIX) 20 MG tablet Take 1 tablet (20 mg total) by mouth daily. 90 tablet 1  . glucose blood test strip Use as directed once a day.  Dx code: E11.9 100 each 1  . Lancets MISC Use as directed once a day.  Dx code: E11.9 100 each 1  . metFORMIN (GLUCOPHAGE-XR) 500 MG 24 hr tablet Take 1 tablet (500 mg total) by mouth every evening. 30 tablet 2   No facility-administered medications prior to visit.     Allergies    Allergen Reactions  . Ace Inhibitors Swelling    Swelling  . Codeine Nausea And Vomiting  . Fluviral [Flu Virus Vaccine] Other (See Comments)    Asthma exacerabation    Review of Systems  Constitutional: Negative for chills, fever and malaise/fatigue.  HENT: Negative for congestion and hearing loss.   Eyes: Negative for discharge.  Respiratory: Negative for cough, sputum production and shortness of breath.   Cardiovascular: Negative for chest pain, palpitations and leg swelling.  Gastrointestinal: Negative for abdominal pain, blood in stool, constipation, diarrhea, heartburn, nausea and vomiting.  Genitourinary: Negative for dysuria, frequency, hematuria and urgency.  Musculoskeletal: Negative for back pain, falls and myalgias.  Skin: Negative for rash.  Neurological: Negative for dizziness, sensory change, loss of consciousness, weakness and headaches.  Endo/Heme/Allergies: Negative for environmental allergies. Does not bruise/bleed easily.  Psychiatric/Behavioral: Negative for depression and suicidal ideas. The patient is not nervous/anxious and does not have insomnia.        Objective:    Physical Exam  Constitutional: She is oriented to person, place, and time. She appears well-developed and well-nourished.  HENT:  Head: Normocephalic and atraumatic.  Eyes: Conjunctivae and EOM are normal.  Neck: Normal range of motion. Neck supple. No JVD present. Carotid bruit is not present. No thyromegaly present.  Cardiovascular: Normal rate, regular rhythm and normal heart sounds.  No murmur heard. Pulmonary/Chest: Effort normal and breath sounds normal. No respiratory distress. She has no wheezes. She has no rales. She exhibits no tenderness.  Musculoskeletal: She exhibits no edema.  Neurological: She is alert and oriented to person, place, and time.  Skin: There is erythema.     Psychiatric: She has a normal mood and affect.  Nursing note and vitals reviewed.   BP 132/70 (BP  Location: Left Arm, Patient Position: Sitting, Cuff Size: Large)   Pulse 84   Temp 98.4 F (36.9 C) (Oral)   Resp 16   Ht 5' 5.35" (1.66 m)   Wt 275 lb 9.6 oz (125 kg)   SpO2 99%   BMI 45.37 kg/m  Wt Readings from Last 3 Encounters:  10/31/17  275 lb 9.6 oz (125 kg)  09/17/16 271 lb 9.6 oz (123.2 kg)  08/16/15 278 lb (126.1 kg)   BP Readings from Last 3 Encounters:  10/31/17 132/70  05/02/17 138/77  09/17/16 122/80     Immunization History  Administered Date(s) Administered  . Td 04/04/2010    Health Maintenance  Topic Date Due  . PNEUMOCOCCAL POLYSACCHARIDE VACCINE (1) 04/04/1974  . FOOT EXAM  04/04/1982  . OPHTHALMOLOGY EXAM  04/04/1982  . URINE MICROALBUMIN  08/15/2016  . MAMMOGRAM  09/24/2017  . INFLUENZA VACCINE  01/16/2018  . PAP SMEAR  02/09/2018  . HEMOGLOBIN A1C  02/20/2018  . TETANUS/TDAP  04/04/2020  . HIV Screening  Completed    Lab Results  Component Value Date   WBC 6.8 09/17/2016   HGB 11.3 (L) 09/17/2016   HCT 34.9 (L) 09/17/2016   PLT 313.0 09/17/2016   GLUCOSE 169 (H) 08/20/2017   CHOL 143 08/12/2017   TRIG 114.0 08/12/2017   HDL 54.10 08/12/2017   LDLDIRECT 149.6 03/26/2013   LDLCALC 66 08/12/2017   ALT 23 08/12/2017   AST 21 08/12/2017   NA 135 08/20/2017   K 3.9 08/20/2017   CL 99 08/20/2017   CREATININE 0.79 08/20/2017   BUN 10 08/20/2017   CO2 28 08/20/2017   TSH 0.59 08/16/2015   INR 1.01 04/10/2011   HGBA1C 8.5 (H) 08/20/2017   MICROALBUR 2.5 (H) 08/16/2015    Lab Results  Component Value Date   TSH 0.59 08/16/2015   Lab Results  Component Value Date   WBC 6.8 09/17/2016   HGB 11.3 (L) 09/17/2016   HCT 34.9 (L) 09/17/2016   MCV 77.6 (L) 09/17/2016   PLT 313.0 09/17/2016   Lab Results  Component Value Date   NA 135 08/20/2017   K 3.9 08/20/2017   CO2 28 08/20/2017   GLUCOSE 169 (H) 08/20/2017   BUN 10 08/20/2017   CREATININE 0.79 08/20/2017   BILITOT 1.1 08/12/2017   ALKPHOS 70 08/12/2017   AST 21 08/12/2017    ALT 23 08/12/2017   PROT 8.4 (H) 08/12/2017   ALBUMIN 4.3 08/12/2017   CALCIUM 9.8 08/20/2017   GFR 101.04 08/20/2017   Lab Results  Component Value Date   CHOL 143 08/12/2017   Lab Results  Component Value Date   HDL 54.10 08/12/2017   Lab Results  Component Value Date   LDLCALC 66 08/12/2017   Lab Results  Component Value Date   TRIG 114.0 08/12/2017   Lab Results  Component Value Date   CHOLHDL 3 08/12/2017   Lab Results  Component Value Date   HGBA1C 8.5 (H) 08/20/2017         Assessment & Plan:   Problem List Items Addressed This Visit    None    Visit Diagnoses    Tick bite of left lower leg, initial encounter    -  Primary   Relevant Medications   doxycycline (VIBRA-TABS) 100 MG tablet   Other Relevant Orders   B. burgdorfi antibodies   Rocky mtn spotted fvr abs pnl(IgG+IgM)      I am having Michele Tran start on doxycycline. I am also having her maintain her fluocinonide cream, furosemide, carvedilol, atorvastatin, amLODipine, fluticasone, glucose blood, Lancets, blood glucose meter kit and supplies, and metFORMIN.  Meds ordered this encounter  Medications  . doxycycline (VIBRA-TABS) 100 MG tablet    Sig: Take 1 tablet (100 mg total) by mouth 2 (two) times daily.    Dispense:  20 tablet    Refill:  0    CMA served as scribe during this visit. History, Physical and Plan performed by medical provider. Documentation and orders reviewed and attested to.  Ann Held, DO

## 2017-11-01 LAB — ROCKY MTN SPOTTED FVR ABS PNL(IGG+IGM)
RMSF IgG: NOT DETECTED
RMSF IgM: NOT DETECTED

## 2017-11-01 LAB — B. BURGDORFI ANTIBODIES: B burgdorferi Ab IgG+IgM: <0.9 index

## 2017-11-19 ENCOUNTER — Other Ambulatory Visit: Payer: Self-pay | Admitting: Family Medicine

## 2017-11-20 ENCOUNTER — Other Ambulatory Visit (INDEPENDENT_AMBULATORY_CARE_PROVIDER_SITE_OTHER): Payer: BLUE CROSS/BLUE SHIELD

## 2017-11-20 DIAGNOSIS — E119 Type 2 diabetes mellitus without complications: Secondary | ICD-10-CM

## 2017-11-20 LAB — COMPREHENSIVE METABOLIC PANEL
ALT: 16 U/L (ref 0–35)
AST: 17 U/L (ref 0–37)
Albumin: 4.1 g/dL (ref 3.5–5.2)
Alkaline Phosphatase: 58 U/L (ref 39–117)
BUN: 8 mg/dL (ref 6–23)
CO2: 26 mEq/L (ref 19–32)
Calcium: 9.3 mg/dL (ref 8.4–10.5)
Chloride: 101 mEq/L (ref 96–112)
Creatinine, Ser: 0.61 mg/dL (ref 0.40–1.20)
GFR: 136.02 mL/min (ref >60.00)
Glucose, Bld: 105 mg/dL — ABNORMAL HIGH (ref 70–99)
Potassium: 4 mEq/L (ref 3.5–5.1)
Sodium: 136 mEq/L (ref 135–145)
Total Bilirubin: 1.1 mg/dL (ref 0.2–1.2)
Total Protein: 7.5 g/dL (ref 6.0–8.3)

## 2017-11-20 LAB — LIPID PANEL
Cholesterol: 115 mg/dL (ref 0–200)
HDL: 45.9 mg/dL (ref >39.00)
LDL Cholesterol: 48 mg/dL (ref 0–99)
NonHDL: 69.13
Total CHOL/HDL Ratio: 3
Triglycerides: 106 mg/dL (ref 0.0–149.0)
VLDL: 21.2 mg/dL (ref 0.0–40.0)

## 2017-11-20 LAB — HEMOGLOBIN A1C: Hgb A1c MFr Bld: 7.5 % — ABNORMAL HIGH (ref 4.6–6.5)

## 2017-11-21 ENCOUNTER — Other Ambulatory Visit: Payer: BLUE CROSS/BLUE SHIELD

## 2017-11-21 ENCOUNTER — Other Ambulatory Visit: Payer: Self-pay | Admitting: Family Medicine

## 2017-11-21 DIAGNOSIS — I10 Essential (primary) hypertension: Secondary | ICD-10-CM

## 2017-11-22 ENCOUNTER — Other Ambulatory Visit: Payer: Self-pay | Admitting: Family Medicine

## 2017-12-08 ENCOUNTER — Other Ambulatory Visit: Payer: Self-pay | Admitting: Family Medicine

## 2017-12-09 ENCOUNTER — Telehealth: Payer: Self-pay | Admitting: Family Medicine

## 2017-12-09 ENCOUNTER — Other Ambulatory Visit: Payer: Self-pay | Admitting: Family Medicine

## 2017-12-09 NOTE — Telephone Encounter (Signed)
Copied from CRM [phone]#[id number]. Topic: Quick Communication - See Telephone Encounter >> Dec 09, 2017  4:07 PM Lorrine KinMcGee, Kerstin Crusoe B, NT wrote: CRM for notification. See Telephone encounter for: 12/09/17. Patient calling and states that the pharmacy will not refill her metformin due to a new prescription for new dosage not being sent. States that a new prescription needed for 2 pills a day. Please advise [id number] - [place], [place] - [address]#: [phone]

## 2017-12-10 ENCOUNTER — Encounter: Payer: Self-pay | Admitting: Family Medicine

## 2017-12-11 MED ORDER — METFORMIN HCL ER 500 MG PO TB24: ORAL_TABLET | ORAL | 1 refills | Status: DC

## 2017-12-11 NOTE — Telephone Encounter (Signed)
Patient notified that medication has been sent in. 

## 2017-12-11 NOTE — Telephone Encounter (Signed)
Patient notified that rx has been sent in. 

## 2018-02-25 ENCOUNTER — Other Ambulatory Visit (INDEPENDENT_AMBULATORY_CARE_PROVIDER_SITE_OTHER): Payer: BLUE CROSS/BLUE SHIELD

## 2018-02-25 DIAGNOSIS — E785 Hyperlipidemia, unspecified: Secondary | ICD-10-CM | POA: Diagnosis not present

## 2018-02-25 DIAGNOSIS — I10 Essential (primary) hypertension: Secondary | ICD-10-CM

## 2018-02-25 LAB — COMPREHENSIVE METABOLIC PANEL
ALT: 14 U/L (ref 0–35)
AST: 14 U/L (ref 0–37)
Albumin: 4.2 g/dL (ref 3.5–5.2)
Alkaline Phosphatase: 52 U/L (ref 39–117)
BUN: 11 mg/dL (ref 6–23)
CO2: 27 mEq/L (ref 19–32)
Calcium: 9.5 mg/dL (ref 8.4–10.5)
Chloride: 102 mEq/L (ref 96–112)
Creatinine, Ser: 0.72 mg/dL (ref 0.40–1.20)
GFR: 112.2 mL/min (ref >60.00)
Glucose, Bld: 103 mg/dL — ABNORMAL HIGH (ref 70–99)
Potassium: 4.2 mEq/L (ref 3.5–5.1)
Sodium: 136 mEq/L (ref 135–145)
Total Bilirubin: 0.9 mg/dL (ref 0.2–1.2)
Total Protein: 8 g/dL (ref 6.0–8.3)

## 2018-02-25 LAB — LIPID PANEL
Cholesterol: 113 mg/dL (ref 0–200)
HDL: 48.3 mg/dL (ref >39.00)
LDL Cholesterol: 42 mg/dL (ref 0–99)
NonHDL: 65.16
Total CHOL/HDL Ratio: 2
Triglycerides: 115 mg/dL (ref 0.0–149.0)
VLDL: 23 mg/dL (ref 0.0–40.0)

## 2018-02-25 LAB — HEMOGLOBIN A1C: Hgb A1c MFr Bld: 6.7 % — ABNORMAL HIGH (ref 4.6–6.5)

## 2018-03-02 ENCOUNTER — Other Ambulatory Visit: Payer: Self-pay | Admitting: Family Medicine

## 2018-03-02 DIAGNOSIS — I10 Essential (primary) hypertension: Secondary | ICD-10-CM

## 2018-04-22 ENCOUNTER — Other Ambulatory Visit: Payer: Self-pay | Admitting: Family Medicine

## 2018-04-22 DIAGNOSIS — Z1231 Encounter for screening mammogram for malignant neoplasm of breast: Secondary | ICD-10-CM

## 2018-04-25 ENCOUNTER — Ambulatory Visit (HOSPITAL_BASED_OUTPATIENT_CLINIC_OR_DEPARTMENT_OTHER)
Admission: RE | Admit: 2018-04-25 | Discharge: 2018-04-25 | Disposition: A | Discharge status: Home / Self Care | Payer: 59 | Source: Ambulatory Visit | Attending: Family Medicine | Admitting: Family Medicine

## 2018-04-25 DIAGNOSIS — Z1231 Encounter for screening mammogram for malignant neoplasm of breast: Secondary | ICD-10-CM | POA: Diagnosis not present

## 2018-05-08 ENCOUNTER — Encounter: Payer: Self-pay | Admitting: Family Medicine

## 2018-05-08 ENCOUNTER — Other Ambulatory Visit: Payer: Self-pay | Admitting: Family Medicine

## 2018-05-08 ENCOUNTER — Other Ambulatory Visit: Payer: Self-pay

## 2018-05-08 MED ORDER — METFORMIN HCL ER 500 MG PO TB24: ORAL_TABLET | ORAL | 1 refills | Status: DC

## 2018-05-08 NOTE — Telephone Encounter (Signed)
Ok to send in whatever she needs --- I will send to a local pharmacy so she has it

## 2018-05-09 NOTE — Telephone Encounter (Signed)
See other mychart message.

## 2018-05-12 ENCOUNTER — Other Ambulatory Visit: Payer: Self-pay | Admitting: *Deleted

## 2018-05-12 DIAGNOSIS — I10 Essential (primary) hypertension: Secondary | ICD-10-CM

## 2018-05-12 MED ORDER — AMLODIPINE BESYLATE 5 MG PO TABS: 5.0000 mg | ORAL_TABLET | Freq: Every day | ORAL | 1 refills | Status: DC

## 2018-05-12 MED ORDER — FUROSEMIDE 20 MG PO TABS: 20.0000 mg | ORAL_TABLET | Freq: Every day | ORAL | 1 refills | Status: DC

## 2018-05-12 MED ORDER — ATORVASTATIN CALCIUM 40 MG PO TABS: 40.0000 mg | ORAL_TABLET | Freq: Every day | ORAL | 1 refills | Status: DC

## 2018-05-12 MED ORDER — CARVEDILOL 25 MG PO TABS: ORAL_TABLET | ORAL | 1 refills | Status: DC

## 2018-05-13 ENCOUNTER — Encounter: Payer: Self-pay | Admitting: Family Medicine

## 2018-08-15 ENCOUNTER — Encounter: Payer: Self-pay | Admitting: Family Medicine

## 2018-08-26 ENCOUNTER — Encounter: Payer: Self-pay | Admitting: Family Medicine

## 2018-08-26 NOTE — Telephone Encounter (Signed)
Letter has been done----  I could not print jury letter

## 2018-08-26 NOTE — Telephone Encounter (Signed)
Can you help me with wording on this letter?

## 2018-09-02 ENCOUNTER — Other Ambulatory Visit: Payer: Self-pay | Admitting: *Deleted

## 2018-09-02 MED ORDER — GLUCOSE BLOOD VI STRP: ORAL_STRIP | 1 refills | Status: DC

## 2018-09-12 ENCOUNTER — Other Ambulatory Visit: Payer: Self-pay | Admitting: Family Medicine

## 2018-09-12 DIAGNOSIS — I10 Essential (primary) hypertension: Secondary | ICD-10-CM

## 2018-09-17 ENCOUNTER — Other Ambulatory Visit: Payer: Self-pay | Admitting: Family Medicine

## 2019-01-15 ENCOUNTER — Other Ambulatory Visit: Payer: Self-pay | Admitting: Family Medicine

## 2019-01-15 DIAGNOSIS — I10 Essential (primary) hypertension: Secondary | ICD-10-CM

## 2019-01-16 MED ORDER — FLUOCINONIDE 0.05 % EX CREA: TOPICAL_CREAM | Freq: Two times a day (BID) | CUTANEOUS | 1 refills | Status: DC | PRN

## 2019-01-20 ENCOUNTER — Other Ambulatory Visit: Payer: Self-pay | Admitting: *Deleted

## 2019-01-20 ENCOUNTER — Encounter: Payer: Self-pay | Admitting: *Deleted

## 2019-01-20 DIAGNOSIS — J0110 Acute frontal sinusitis, unspecified: Secondary | ICD-10-CM

## 2019-01-20 MED ORDER — FLUTICASONE PROPIONATE 50 MCG/ACT NA SUSP: 2.0000 | Freq: Every day | NASAL | 0 refills | Status: DC | PRN

## 2019-05-17 ENCOUNTER — Other Ambulatory Visit: Payer: Self-pay | Admitting: Family Medicine

## 2019-06-03 ENCOUNTER — Other Ambulatory Visit (HOSPITAL_BASED_OUTPATIENT_CLINIC_OR_DEPARTMENT_OTHER): Payer: Self-pay | Admitting: Family Medicine

## 2019-06-03 DIAGNOSIS — Z1231 Encounter for screening mammogram for malignant neoplasm of breast: Secondary | ICD-10-CM

## 2019-06-15 ENCOUNTER — Other Ambulatory Visit: Payer: Self-pay | Admitting: Family Medicine

## 2019-06-15 ENCOUNTER — Other Ambulatory Visit: Payer: Self-pay

## 2019-06-15 ENCOUNTER — Ambulatory Visit (HOSPITAL_BASED_OUTPATIENT_CLINIC_OR_DEPARTMENT_OTHER)
Admission: RE | Admit: 2019-06-15 | Discharge: 2019-06-15 | Disposition: A | Discharge status: Home / Self Care | Payer: BC Managed Care – PPO | Source: Ambulatory Visit | Attending: Family Medicine | Admitting: Family Medicine

## 2019-06-15 ENCOUNTER — Encounter: Payer: Self-pay | Admitting: Family Medicine

## 2019-06-15 ENCOUNTER — Ambulatory Visit (HOSPITAL_BASED_OUTPATIENT_CLINIC_OR_DEPARTMENT_OTHER): Payer: 59

## 2019-06-15 ENCOUNTER — Ambulatory Visit (INDEPENDENT_AMBULATORY_CARE_PROVIDER_SITE_OTHER): Payer: BC Managed Care – PPO | Admitting: Family Medicine

## 2019-06-15 VITALS — BP 146/92 | HR 83 | Temp 96.9°F | Resp 17 | Ht 65.0 in | Wt 268.0 lb

## 2019-06-15 DIAGNOSIS — L309 Dermatitis, unspecified: Secondary | ICD-10-CM

## 2019-06-15 DIAGNOSIS — E1169 Type 2 diabetes mellitus with other specified complication: Secondary | ICD-10-CM | POA: Diagnosis not present

## 2019-06-15 DIAGNOSIS — E785 Hyperlipidemia, unspecified: Secondary | ICD-10-CM

## 2019-06-15 DIAGNOSIS — E049 Nontoxic goiter, unspecified: Secondary | ICD-10-CM | POA: Insufficient documentation

## 2019-06-15 DIAGNOSIS — E041 Nontoxic single thyroid nodule: Secondary | ICD-10-CM

## 2019-06-15 DIAGNOSIS — E1165 Type 2 diabetes mellitus with hyperglycemia: Secondary | ICD-10-CM | POA: Diagnosis not present

## 2019-06-15 DIAGNOSIS — Z0000 Encounter for general adult medical examination without abnormal findings: Secondary | ICD-10-CM

## 2019-06-15 DIAGNOSIS — Z23 Encounter for immunization: Secondary | ICD-10-CM | POA: Diagnosis not present

## 2019-06-15 DIAGNOSIS — L853 Xerosis cutis: Secondary | ICD-10-CM

## 2019-06-15 DIAGNOSIS — E042 Nontoxic multinodular goiter: Secondary | ICD-10-CM | POA: Diagnosis not present

## 2019-06-15 DIAGNOSIS — I10 Essential (primary) hypertension: Secondary | ICD-10-CM

## 2019-06-15 MED ORDER — CARVEDILOL 25 MG PO TABS: ORAL_TABLET | ORAL | 3 refills | Status: DC

## 2019-06-15 MED ORDER — METFORMIN HCL ER 500 MG PO TB24: ORAL_TABLET | ORAL | 3 refills | Status: DC

## 2019-06-15 MED ORDER — AMLODIPINE BESYLATE 5 MG PO TABS: 5.0000 mg | ORAL_TABLET | Freq: Every day | ORAL | 3 refills | Status: DC

## 2019-06-15 MED ORDER — FLUOCINONIDE 0.05 % EX CREA: TOPICAL_CREAM | Freq: Two times a day (BID) | CUTANEOUS | 3 refills | Status: DC | PRN

## 2019-06-15 MED ORDER — AMMONIUM LACTATE 12 % EX CREA: TOPICAL_CREAM | CUTANEOUS | 0 refills | Status: DC | PRN

## 2019-06-15 MED ORDER — FREESTYLE LANCETS MISC: 1 refills | Status: DC

## 2019-06-15 MED ORDER — ATORVASTATIN CALCIUM 40 MG PO TABS: 40.0000 mg | ORAL_TABLET | Freq: Every day | ORAL | 3 refills | Status: DC

## 2019-06-15 NOTE — Progress Notes (Signed)
Subjective:     Michele Tran is a 48 y.o. female and is here for a comprehensive physical exam. The patient reports no problems.  HYPERTENSION   Blood pressure range-not checking   Chest pain- no      Dyspnea- no Lightheadedness- no   Edema- no  Other side effects - no   Medication compliance: good Low salt diet- yes    DIABETES    Blood Sugar ranges-93-186  Polyuria- no New Visual problems- no  Hypoglycemic symptoms- no good  Other side effects-no Medication compliance - good Last eye exam- due Foot exam- today   HYPERLIPIDEMIA  Medication compliance-good RUQ pain- no  Muscle aches- no Other side effects-no      Social History   Socioeconomic History  . Marital status: Single    Spouse name: Not on file  . Number of children: Not on file  . Years of education: Not on file  . Highest education level: Not on file  Occupational History  . Occupation: [employer]    Employer: [employer]  Tobacco Use  . Smoking status: Never Smoker  . Smokeless tobacco: Never Used  Substance and Sexual Activity  . Alcohol use: No  . Drug use: No  . Sexual activity: Yes    Partners: Male  Other Topics Concern  . Not on file  Social History Narrative   Exercise-- walking    Social Determinants of Health   Financial Resource Strain:   . Difficulty of Paying Living Expenses: Not on file  Food Insecurity:   . Worried About Charity fundraiser in the Last Year: Not on file  . Ran Out of Food in the Last Year: Not on file  Transportation Needs:   . Lack of Transportation (Medical): Not on file  . Lack of Transportation (Non-Medical): Not on file  Physical Activity:   . Days of Exercise per Week: Not on file  . Minutes of Exercise per Session: Not on file  Stress:   . Feeling of Stress : Not on file  Social Connections:   . Frequency of Communication with Friends and Family: Not on file  . Frequency of Social Gatherings with Friends and Family: Not on file  .  Attends Religious Services: Not on file  . Active Member of Clubs or Organizations: Not on file  . Attends Archivist Meetings: Not on file  . Marital Status: Not on file  Intimate Partner Violence:   . Fear of Current or Ex-Partner: Not on file  . Emotionally Abused: Not on file  . Physically Abused: Not on file  . Sexually Abused: Not on file   Health Maintenance  Topic Date Due  . PNEUMOCOCCAL POLYSACCHARIDE VACCINE AGE 2-64 HIGH RISK  04/04/1974  . FOOT EXAM  04/04/1982  . OPHTHALMOLOGY EXAM  04/04/1982  . URINE MICROALBUMIN  08/15/2016  . PAP SMEAR-Modifier  02/09/2018  . HEMOGLOBIN A1C  08/26/2018  . INFLUENZA VACCINE  01/17/2019  . MAMMOGRAM  04/26/2019  . TETANUS/TDAP  04/04/2020  . HIV Screening  Completed    The following portions of the patient's history were reviewed and updated as appropriate: She  has a past medical history of Abdominal bloating, Hirsutism, Hyperlipidemia, Hypertension, Obesity, morbid ([place]), and Polycystic ovaries. She does not have any pertinent problems on file. She  has a past surgical history that includes UTERINE polyps (05-31-10). Her family history includes Arthritis in her unknown relative; Breast cancer in her paternal aunt; Cancer in her paternal  aunt; Cancer (age of onset: 31) in her father; Coronary artery disease in her paternal uncle; Diabetes in her unknown relative; Hyperlipidemia in her unknown relative; Hypertension in her unknown relative; Leukemia in her paternal uncle; Lung cancer in her paternal aunt; Stroke (age of onset: 53) in her mother. She  reports that she has never smoked. She has never used smokeless tobacco. She reports that she does not drink alcohol or use drugs. She has a current medication list which includes the following prescription(s): amlodipine, atorvastatin, blood glucose meter kit and supplies, carvedilol, doxycycline, fluocinonide cream, fluticasone, furosemide, glucose blood, freestyle, metformin, and  ammonium lactate. Current Outpatient Medications on File Prior to Visit  Medication Sig Dispense Refill  . blood glucose meter kit and supplies Dispense based on patient and insurance preference.  Use as directed once a day.  Dx code: E11.9 1 each 0  . doxycycline (VIBRA-TABS) 100 MG tablet Take 1 tablet (100 mg total) by mouth 2 (two) times daily. 20 tablet 0  . fluticasone (FLONASE) 50 MCG/ACT nasal spray Place 2 sprays into both nostrils daily as needed for allergies or rhinitis. 48 g 0  . furosemide (LASIX) 20 MG tablet TAKE 1 TABLET BY MOUTH  DAILY 30 tablet 0  . glucose blood (FREESTYLE LITE) test strip Use as instructed once a day.  Dx code:  E11.9 100 each 1   No current facility-administered medications on file prior to visit.   She is allergic to ace inhibitors; codeine; and fluviral [flu virus vaccine]..  Review of Systems Review of Systems  Constitutional: Negative for chills, fever and malaise/fatigue.  HENT: Negative for congestion and hearing loss.   Eyes: Negative for discharge.  Respiratory: Negative for cough, sputum production and shortness of breath.   Cardiovascular: Negative for chest pain, palpitations and leg swelling.  Gastrointestinal: Negative for abdominal pain, blood in stool, constipation, diarrhea, heartburn, nausea and vomiting.  Genitourinary: Negative for dysuria, frequency, hematuria and urgency.  Musculoskeletal: Negative for back pain, falls and myalgias.  Skin: Negative for rash.  Neurological: Negative for dizziness, sensory change, loss of consciousness, weakness and headaches.  Endo/Heme/Allergies: Negative for environmental allergies. Does not bruise/bleed easily.  Psychiatric/Behavioral: Negative for depression and suicidal ideas. The patient is not nervous/anxious and does not have insomnia.     Objective:    BP (!) 146/92 (BP Location: Left Arm, Patient Position: Sitting, Cuff Size: Large)   Pulse 83   Temp (!) 96.9 F (36.1 C) (Temporal)    Resp 17   Ht 5' 5"  (1.651 m)   Wt 268 lb (121.6 kg)   LMP 05/17/2019 (Exact Date)   SpO2 99%   BMI 44.60 kg/m  General appearance: alert, cooperative, appears stated age and no distress Head: Normocephalic, without obvious abnormality, atraumatic Eyes: conjunctivae/corneas clear. PERRL, EOM's intact. Fundi benign. Ears: normal TM's and external ear canals both ears Nose: Nares normal. Septum midline. Mucosa normal. No drainage or sinus tenderness. Throat: lips, mucosa, and tongue normal; teeth and gums normal Neck: no adenopathy, no carotid bruit, no JVD, supple, symmetrical, trachea midline and thyroid not enlarged, symmetric, no tenderness/mass/nodules Back: symmetric, no curvature. ROM normal. No CVA tenderness. Lungs: clear to auscultation bilaterally Breasts: gyn Heart: regular rate and rhythm, S1, S2 normal, no murmur, click, rub or gallop Abdomen: soft, non-tender; bowel sounds normal; no masses,  no organomegaly Pelvic: deferred --gyn Extremities: extremities normal, atraumatic, no cyanosis or edema Pulses: 2+ and symmetric Skin: Skin color, texture, turgor normal. No rashes or lesions Lymph  nodes: Cervical, supraclavicular, and axillary nodes normal. Neurologic: Alert and oriented X 3, normal strength and tone. Normal symmetric reflexes. Normal coordination and gait    Assessment:    Healthy female exam.      Plan:    ghm utd Check labs See AVS See After Visit Summary for Counseling Recommendations    1. Need for pneumococcal vaccination  - Pneumococcal vaccine 23  2. Uncontrolled type 2 diabetes mellitus with hyperglycemia (HCC) Check labs  - Microalbumin / creatinine urine ratio - Lancets (FREESTYLE) lancets; USE ONCE DAILY AS DIRECTED  Dispense: 100 each; Refill: 1 - metFORMIN (GLUCOPHAGE-XR) 500 MG 24 hr tablet; TAKE 2 TABLETS BY MOUTH  EVERY DAY  Dispense: 180 tablet; Refill: 3 - Lipid panel; Future - Hemoglobin A1c; Future - CBC with Differential;  Future - TSH; Future - Comprehensive metabolic panel; Future  3. Essential hypertension Well controlled, no changes to meds. Encouraged heart healthy diet such as the DASH diet and exercise as tolerated.  - amLODipine (NORVASC) 5 MG tablet; Take 1 tablet (5 mg total) by mouth daily.  Dispense: 90 tablet; Refill: 3 - carvedilol (COREG) 25 MG tablet; TAKE 1 TABLET BY MOUTH  TWICE DAILY WITH MEALS  Dispense: 180 tablet; Refill: 3 - Lipid panel; Future - Hemoglobin A1c; Future - CBC with Differential; Future - TSH; Future - Comprehensive metabolic panel; Future  4. Preventative health care ghm utd Check labs  - Microalbumin / creatinine urine ratio - Lipid panel; Future - Hemoglobin A1c; Future - CBC with Differential; Future - TSH; Future - Comprehensive metabolic panel; Future  5. Hyperlipidemia associated with type 2 diabetes mellitus ([place]) Tolerating statin, encouraged heart healthy diet, avoid trans fats, minimize simple carbs and saturated fats. Increase exercise as tolerated - atorvastatin (LIPITOR) 40 MG tablet; Take 1 tablet (40 mg total) by mouth daily.  Dispense: 90 tablet; Refill: 3 - Lipid panel; Future - Comprehensive metabolic panel; Future  6. Dry skin   - ammonium lactate (LAC-HYDRIN) 12 % cream; Apply topically as needed for dry skin.  Dispense: 385 g; Refill: 0  7. Eczema, unspecified type   - fluocinonide cream (LIDEX) 0.05 %; Apply topically 2 (two) times daily as needed.  Dispense: 60 g; Refill: 3  8. Morbid obesity, unspecified obesity type ([place])    9. Goiter Check [place] and labs - US THYROID; Future - TSH; Future

## 2019-06-15 NOTE — Patient Instructions (Addendum)

## 2019-06-16 ENCOUNTER — Other Ambulatory Visit (INDEPENDENT_AMBULATORY_CARE_PROVIDER_SITE_OTHER): Payer: BC Managed Care – PPO

## 2019-06-16 ENCOUNTER — Ambulatory Visit (HOSPITAL_BASED_OUTPATIENT_CLINIC_OR_DEPARTMENT_OTHER)
Admission: RE | Admit: 2019-06-16 | Discharge: 2019-06-16 | Disposition: A | Discharge status: Home / Self Care | Payer: BC Managed Care – PPO | Source: Ambulatory Visit | Attending: Family Medicine | Admitting: Family Medicine

## 2019-06-16 DIAGNOSIS — E1165 Type 2 diabetes mellitus with hyperglycemia: Secondary | ICD-10-CM | POA: Diagnosis not present

## 2019-06-16 DIAGNOSIS — E1169 Type 2 diabetes mellitus with other specified complication: Secondary | ICD-10-CM | POA: Diagnosis not present

## 2019-06-16 DIAGNOSIS — E049 Nontoxic goiter, unspecified: Secondary | ICD-10-CM

## 2019-06-16 DIAGNOSIS — I10 Essential (primary) hypertension: Secondary | ICD-10-CM

## 2019-06-16 DIAGNOSIS — E785 Hyperlipidemia, unspecified: Secondary | ICD-10-CM

## 2019-06-16 DIAGNOSIS — Z1231 Encounter for screening mammogram for malignant neoplasm of breast: Secondary | ICD-10-CM | POA: Diagnosis not present

## 2019-06-16 DIAGNOSIS — Z0000 Encounter for general adult medical examination without abnormal findings: Secondary | ICD-10-CM

## 2019-06-16 LAB — MICROALBUMIN / CREATININE URINE RATIO
Creatinine,U: 20.2 mg/dL
Microalb Creat Ratio: 3.5 mg/g (ref 0.0–30.0)
Microalb, Ur: <0.7 mg/dL (ref 0.0–1.9)

## 2019-06-16 LAB — COMPREHENSIVE METABOLIC PANEL
ALT: 14 U/L (ref 0–35)
AST: 17 U/L (ref 0–37)
Albumin: 4.4 g/dL (ref 3.5–5.2)
Alkaline Phosphatase: 55 U/L (ref 39–117)
BUN: 9 mg/dL (ref 6–23)
CO2: 26 mEq/L (ref 19–32)
Calcium: 9.7 mg/dL (ref 8.4–10.5)
Chloride: 103 mEq/L (ref 96–112)
Creatinine, Ser: 0.73 mg/dL (ref 0.40–1.20)
GFR: 103.31 mL/min (ref >60.00)
Glucose, Bld: 124 mg/dL — ABNORMAL HIGH (ref 70–99)
Potassium: 4.1 mEq/L (ref 3.5–5.1)
Sodium: 137 mEq/L (ref 135–145)
Total Bilirubin: 1.2 mg/dL (ref 0.2–1.2)
Total Protein: 8.2 g/dL (ref 6.0–8.3)

## 2019-06-16 LAB — TSH: TSH: 0.86 u[IU]/mL (ref 0.35–4.50)

## 2019-06-16 LAB — CBC WITH DIFFERENTIAL/PLATELET
Basophils Absolute: 0 10*3/uL (ref 0.0–0.1)
Basophils Relative: 0.6 % (ref 0.0–3.0)
Eosinophils Absolute: 0.2 10*3/uL (ref 0.0–0.7)
Eosinophils Relative: 2.6 % (ref 0.0–5.0)
HCT: 36.3 % (ref 36.0–46.0)
Hemoglobin: 11.6 g/dL — ABNORMAL LOW (ref 12.0–15.0)
Lymphocytes Relative: 17.3 % (ref 12.0–46.0)
Lymphs Abs: 1.4 10*3/uL (ref 0.7–4.0)
MCHC: 32 g/dL (ref 30.0–36.0)
MCV: 79.2 fl (ref 78.0–100.0)
Monocytes Absolute: 0.6 10*3/uL (ref 0.1–1.0)
Monocytes Relative: 6.9 % (ref 3.0–12.0)
Neutro Abs: 5.9 10*3/uL (ref 1.4–7.7)
Neutrophils Relative %: 72.6 % (ref 43.0–77.0)
Platelets: 320 10*3/uL (ref 150.0–400.0)
RBC: 4.59 Mil/uL (ref 3.87–5.11)
RDW: 15.9 % — ABNORMAL HIGH (ref 11.5–15.5)
WBC: 8.1 10*3/uL (ref 4.0–10.5)

## 2019-06-16 LAB — LIPID PANEL
Cholesterol: 118 mg/dL (ref 0–200)
HDL: 52.4 mg/dL (ref >39.00)
LDL Cholesterol: 48 mg/dL (ref 0–99)
NonHDL: 65.19
Total CHOL/HDL Ratio: 2
Triglycerides: 88 mg/dL (ref 0.0–149.0)
VLDL: 17.6 mg/dL (ref 0.0–40.0)

## 2019-06-16 LAB — HEMOGLOBIN A1C: Hgb A1c MFr Bld: 6.5 % (ref 4.6–6.5)

## 2019-06-25 ENCOUNTER — Encounter: Payer: Self-pay | Admitting: Family Medicine

## 2019-06-25 NOTE — Telephone Encounter (Signed)
I would recommend she get it.  It is different then the flu shot  The covid vaccine also is not live so you can not get covid from it.

## 2019-06-29 ENCOUNTER — Encounter: Payer: Self-pay | Admitting: Family Medicine

## 2019-06-29 DIAGNOSIS — Z20822 Contact with and (suspected) exposure to covid-19: Secondary | ICD-10-CM | POA: Diagnosis not present

## 2019-06-29 DIAGNOSIS — Z0389 Encounter for observation for other suspected diseases and conditions ruled out: Secondary | ICD-10-CM | POA: Diagnosis not present

## 2019-07-02 ENCOUNTER — Encounter: Payer: Self-pay | Admitting: Family Medicine

## 2019-07-06 ENCOUNTER — Ambulatory Visit (INDEPENDENT_AMBULATORY_CARE_PROVIDER_SITE_OTHER): Payer: BC Managed Care – PPO | Admitting: Family Medicine

## 2019-07-06 ENCOUNTER — Encounter: Payer: Self-pay | Admitting: Family Medicine

## 2019-07-06 DIAGNOSIS — J40 Bronchitis, not specified as acute or chronic: Secondary | ICD-10-CM | POA: Diagnosis not present

## 2019-07-06 DIAGNOSIS — U071 COVID-19: Secondary | ICD-10-CM

## 2019-07-06 MED ORDER — AZITHROMYCIN 250 MG PO TABS: ORAL_TABLET | ORAL | 0 refills | Status: DC

## 2019-07-06 MED ORDER — PROMETHAZINE-DM 6.25-15 MG/5ML PO SYRP: 5.0000 mL | ORAL_SOLUTION | Freq: Four times a day (QID) | ORAL | 0 refills | Status: DC | PRN

## 2019-07-06 NOTE — Progress Notes (Signed)
Virtual Visit via Video Note  I connected with Michele Tran on 07/06/19 at  2:20 PM EST by a video enabled telemedicine application and verified that I am speaking with the correct person using two identifiers.  Location: Patient: home with mom Provider: office    I discussed the limitations of evaluation and management by telemedicine and the availability of in person appointments. The patient expressed understanding and agreed to proceed.  History of Present Illness: Pt is home and tested + for covid last Monday.  She has fever, body aches  + prod cough    Observations/Objective: 101.8 fever Pulse ox 98%  Pulse 77   Pt is in NAD No sob Assessment and Plan: 1. Bronchitis due to COVID-19 virus zithromax mucinex and phenergan dm Call back if symptoms do not improve and will consider resp clinic  - azithromycin (ZITHROMAX Z-PAK) 250 MG tablet; As directed  Dispense: 6 each; Refill: 0 - promethazine-dextromethorphan (PROMETHAZINE-DM) 6.25-15 MG/5ML syrup; Take 5 mLs by mouth 4 (four) times daily as needed.  Dispense: 118 mL; Refill: 0   Follow Up Instructions:    I discussed the assessment and treatment plan with the patient. The patient was provided an opportunity to ask questions and all were answered. The patient agreed with the plan and demonstrated an understanding of the instructions.   The patient was advised to call back or seek an in-person evaluation if the symptoms worsen or if the condition fails to improve as anticipated.     Donato Schultz, DO

## 2019-07-06 NOTE — Patient Instructions (Signed)
COVID-19 Vaccine Information can be found at: [email or web address] For questions related to vaccine distribution or appointments, please email [email or web address] or call [phone].    

## 2019-07-12 ENCOUNTER — Encounter: Payer: Self-pay | Admitting: Family Medicine

## 2019-07-13 ENCOUNTER — Encounter: Payer: Self-pay | Admitting: Family Medicine

## 2019-07-13 DIAGNOSIS — I10 Essential (primary) hypertension: Secondary | ICD-10-CM

## 2019-07-13 DIAGNOSIS — L853 Xerosis cutis: Secondary | ICD-10-CM

## 2019-07-13 DIAGNOSIS — L309 Dermatitis, unspecified: Secondary | ICD-10-CM

## 2019-07-13 DIAGNOSIS — E1169 Type 2 diabetes mellitus with other specified complication: Secondary | ICD-10-CM

## 2019-07-13 DIAGNOSIS — E1165 Type 2 diabetes mellitus with hyperglycemia: Secondary | ICD-10-CM

## 2019-07-13 NOTE — Telephone Encounter (Signed)
Yes -- please get them both an appointment--- thanks

## 2019-07-14 ENCOUNTER — Ambulatory Visit (INDEPENDENT_AMBULATORY_CARE_PROVIDER_SITE_OTHER): Payer: BC Managed Care – PPO | Admitting: Family Medicine

## 2019-07-14 VITALS — BP 130/90 | HR 79 | Temp 98.8°F | Ht 65.5 in | Wt 260.8 lb

## 2019-07-14 DIAGNOSIS — U071 COVID-19: Secondary | ICD-10-CM | POA: Diagnosis not present

## 2019-07-14 DIAGNOSIS — I10 Essential (primary) hypertension: Secondary | ICD-10-CM | POA: Diagnosis not present

## 2019-07-14 DIAGNOSIS — E119 Type 2 diabetes mellitus without complications: Secondary | ICD-10-CM | POA: Diagnosis not present

## 2019-07-14 DIAGNOSIS — J0110 Acute frontal sinusitis, unspecified: Secondary | ICD-10-CM | POA: Diagnosis not present

## 2019-07-14 DIAGNOSIS — R059 Cough, unspecified: Secondary | ICD-10-CM

## 2019-07-14 DIAGNOSIS — G44209 Tension-type headache, unspecified, not intractable: Secondary | ICD-10-CM

## 2019-07-14 DIAGNOSIS — H672 Otitis media in diseases classified elsewhere, left ear: Secondary | ICD-10-CM

## 2019-07-14 DIAGNOSIS — R05 Cough: Secondary | ICD-10-CM

## 2019-07-14 DIAGNOSIS — H671 Otitis media in diseases classified elsewhere, right ear: Secondary | ICD-10-CM

## 2019-07-14 MED ORDER — METFORMIN HCL ER 500 MG PO TB24: ORAL_TABLET | ORAL | 3 refills | Status: DC

## 2019-07-14 MED ORDER — NEOMYCIN-POLYMYXIN-HC 3.5-10000-1 OP SUSP: OPHTHALMIC | 0 refills | Status: DC

## 2019-07-14 MED ORDER — PROMETHAZINE-DM 6.25-15 MG/5ML PO SYRP: 5.0000 mL | ORAL_SOLUTION | Freq: Four times a day (QID) | ORAL | 0 refills | Status: DC | PRN

## 2019-07-14 MED ORDER — FREESTYLE LANCETS MISC: 1 refills | Status: DC

## 2019-07-14 MED ORDER — FLUOCINONIDE 0.05 % EX CREA: TOPICAL_CREAM | Freq: Two times a day (BID) | CUTANEOUS | 3 refills | Status: DC | PRN

## 2019-07-14 MED ORDER — AMLODIPINE BESYLATE 5 MG PO TABS: 5.0000 mg | ORAL_TABLET | Freq: Every day | ORAL | 3 refills | Status: DC

## 2019-07-14 MED ORDER — CARVEDILOL 25 MG PO TABS: ORAL_TABLET | ORAL | 3 refills | Status: DC

## 2019-07-14 MED ORDER — AMMONIUM LACTATE 12 % EX CREA: TOPICAL_CREAM | CUTANEOUS | 0 refills | Status: DC | PRN

## 2019-07-14 MED ORDER — IPRATROPIUM BROMIDE 0.03 % NA SOLN: 2.0000 | Freq: Two times a day (BID) | NASAL | 0 refills | Status: DC

## 2019-07-14 MED ORDER — ATORVASTATIN CALCIUM 40 MG PO TABS: 40.0000 mg | ORAL_TABLET | Freq: Every day | ORAL | 3 refills | Status: DC

## 2019-07-14 NOTE — Telephone Encounter (Signed)
Spoke with patient on the phone and got mom and her scheduled.

## 2019-07-14 NOTE — Progress Notes (Signed)
Patient ID: Michele Tran, female    DOB: [birth year], 49 y.o.   MRN: [id number]  PCP: Ann Held, DO  Chief Complaint  Patient presents with  . HEADACHE / COUGH / RUNNY NOSE / CONGESTION    Subjective:  HPI Michele Tran is a 49 y.o. female presents to [facility] for evaluation of symptoms related to COVID-19. Diagnosed with COVID-19 06/29/19.   Symptoms of cough, shortness of breath, chest tightness improved with Azithromycin and promethazine prescribed by PCP. Today she complains of worsening right ear pain x 4 days. Lower occipital headache. Persistent cough dry which improves with promethazine and occasional productive cough. Completed Azithromycin on Saturday. Continue to experience mild fatigue. She is scheduled to return to work tomorrow, however, doesn't feel she physically able to return. She has remained afebrile and reports blood sugar and blood pressure have remained stale.  High risk for complications related to COVID-19 due to Hypertension and T2DM.   Review of Systems Pertinent negatives listed in HPI  Patient Active Problem List   Diagnosis Date Noted  . Preventative health care 09/17/2016  . Severe obesity (BMI >= 40) ([place]) 03/26/2013  . Angioedema 04/18/2011  . Eczema 10/09/2010  . MORBID OBESITY 04/04/2010  . POLYCYSTIC OVARIES 07/14/2008  . Hyperlipidemia LDL goal <100 07/14/2008  . Essential hypertension 07/14/2008  . LIVER FUNCTION TESTS, ABNORMAL, HX OF 07/14/2008  . HIRSUTISM 06/25/2008  . ABDOMINAL BLOATING 06/25/2008  . PAP SMEAR, ABNORMAL 06/25/2008      Prior to Admission medications   Medication Sig Start Date End Date Taking? Authorizing Provider  amLODipine (NORVASC) 5 MG tablet Take 1 tablet (5 mg total) by mouth daily. 07/14/19  Yes [name] R, DO  ammonium lactate (LAC-HYDRIN) 12 % cream Apply topically as needed for dry skin. 07/14/19  Yes [name] R, DO  atorvastatin (LIPITOR) 40 MG tablet  Take 1 tablet (40 mg total) by mouth daily. 07/14/19  Yes [name] R, DO  blood glucose meter kit and supplies Dispense based on patient and insurance preference.  Use as directed once a day.  Dx code: E11.9 08/22/17  Yes [name] R, DO  carvedilol (COREG) 25 MG tablet TAKE 1 TABLET BY MOUTH  TWICE DAILY WITH MEALS 07/14/19  Yes [name] R, DO  fluocinonide cream (LIDEX) 0.05 % Apply topically 2 (two) times daily as needed. 07/14/19  Yes [name] R, DO  fluticasone (FLONASE) 50 MCG/ACT nasal spray Place 2 sprays into both nostrils daily as needed for allergies or rhinitis. 01/20/19  Yes [name] R, DO  furosemide (LASIX) 20 MG tablet TAKE 1 TABLET BY MOUTH  DAILY 01/16/19  Yes [name] R, DO  glucose blood (FREESTYLE LITE) test strip Use as instructed once a day.  Dx code:  E11.9 09/02/18  Yes [name] R, DO  Lancets (FREESTYLE) lancets USE ONCE DAILY AS DIRECTED 07/14/19  Yes [name] R, DO  metFORMIN (GLUCOPHAGE-XR) 500 MG 24 hr tablet TAKE 2 TABLETS BY MOUTH  EVERY DAY 07/14/19  Yes Ann Held, DO  promethazine-dextromethorphan (PROMETHAZINE-DM) 6.25-15 MG/5ML syrup Take 5 mLs by mouth 4 (four) times daily as needed. 07/06/19  Yes Ann Held, DO    Past Medical, Surgical Family and Social History reviewed and updated.    Objective:   Today's Vitals   07/14/19 1820  BP: 130/90  Pulse: 79  Temp: 98.8 F (37.1  C)  SpO2: 98%  Weight: 260 lb 12.8 oz (118.3 kg)  Height: 5' 5.5" (1.664 m)    Wt Readings from Last 3 Encounters:  07/14/19 260 lb 12.8 oz (118.3 kg)  06/15/19 268 lb (121.6 kg)  10/31/17 275 lb 9.6 oz (125 kg)     Physical Exam Constitutional:      Appearance: Normal appearance. She is normal weight.  HENT:     Head: Normocephalic.     Right Ear: Ear canal normal. Tenderness present. Tympanic membrane is perforated.     Left Ear: Ear canal normal. Tenderness present.  Tympanic membrane is perforated.     Ears:     Comments: Hoarseness of voice     Nose: Congestion and rhinorrhea present.     Mouth/Throat:     Mouth: Mucous membranes are moist.  Eyes:     Extraocular Movements: Extraocular movements intact.     Pupils: Pupils are equal, round, and reactive to light.  Cardiovascular:     Rate and Rhythm: Normal rate and regular rhythm.  Pulmonary:     Effort: Pulmonary effort is normal.     Breath sounds: Normal breath sounds.  Musculoskeletal:     Cervical back: Normal range of motion.  Lymphadenopathy:     Cervical: No cervical adenopathy.  Neurological:     General: No focal deficit present.     Mental Status: She is oriented to person, place, and time.          Assessment & Plan:  1. Acute non intractable tension-type headache, likely related to COVID-19 and and sinus infection.  2. Essential hypertension, stable  -Continue current medications    3. COVID-19, stable, improving. - Comprehensive metabolic panel, to evaluate electrolyte, liver, and renal function  4. Acute frontal sinusitis, recurrence not specified -Continues to experience congestion recently completed Azithromycin.  -Trial Atrovent nasal spray, 2 sprays in both nares BID PRN for congestion    5. Type 2 diabetes mellitus without complication, without long-term current use of insulin ([employer]), last A1C at goal <7.0 - Comprehensive metabolic panel -Continue current therapy  6. Cough, stable, continue  promethazine-dextromethorphan (PROMETHAZINE-DM) 6.25-15 MG/5ML syrup; Take 5 mLs by mouth 4 (four) times daily as needed.  Dispense: 118 mL; Refill: 0  7. Otitis media of both ears due to severe acute respiratory syndrome coronavirus 2 (SARS-CoV-2) -Cortisporin TID x 5 days      Meds ordered this encounter  Medications  . ipratropium (ATROVENT) 0.03 % nasal spray    Sig: Place 2 sprays into both nostrils 2 (two) times daily.    Dispense:  30 mL    Refill:  0  .  neomycin-polymyxin-hydrocortisone (CORTISPORIN) 3.5-10000-1 ophthalmic suspension    Sig: 2 drops both ears 3 times daily x 5 days    Dispense:  7.5 mL    Refill:  0  . promethazine-dextromethorphan (PROMETHAZINE-DM) 6.25-15 MG/5ML syrup    Sig: Take 5 mLs by mouth 4 (four) times daily as needed.    Dispense:  118 mL    Refill:  0    -The patient was given clear instructions to go to ER or return to medical center if symptoms do not improve, worsen or new problems develop. The patient verbalized understanding.     Molli Barrows, FNP-C [facility], PRN Provider  [facility] [place], [place] [facility] Phone: [phone] Clinic Fax: [phone] Clinic Hours: 5:30 pm -7:30 pm (Monday-Friday)

## 2019-07-14 NOTE — Telephone Encounter (Signed)
Patient scheduled and information given on where to go.

## 2019-07-14 NOTE — Telephone Encounter (Signed)
Advised patient that they may do, but not sure.  She can ask at her appointment this evening.

## 2019-07-14 NOTE — Patient Instructions (Signed)

## 2019-07-15 LAB — COMPREHENSIVE METABOLIC PANEL
ALT: 27 IU/L (ref 0–32)
AST: 34 IU/L (ref 0–40)
Albumin/Globulin Ratio: 1.3 (ref 1.2–2.2)
Albumin: 4.6 g/dL (ref 3.8–4.8)
Alkaline Phosphatase: 81 IU/L (ref 39–117)
BUN/Creatinine Ratio: 6 — ABNORMAL LOW (ref 9–23)
BUN: 5 mg/dL — ABNORMAL LOW (ref 6–24)
Bilirubin Total: 1.1 mg/dL (ref 0.0–1.2)
CO2: 25 mmol/L (ref 20–29)
Calcium: 9.6 mg/dL (ref 8.7–10.2)
Chloride: 102 mmol/L (ref 96–106)
Creatinine, Ser: 0.8 mg/dL (ref 0.57–1.00)
GFR calc Af Amer: 102 mL/min/{1.73_m2} (ref >59)
GFR calc non Af Amer: 88 mL/min/{1.73_m2} (ref >59)
Globulin, Total: 3.5 g/dL (ref 1.5–4.5)
Glucose: 95 mg/dL (ref 65–99)
Potassium: 4.1 mmol/L (ref 3.5–5.2)
Sodium: 141 mmol/L (ref 134–144)
Total Protein: 8.1 g/dL (ref 6.0–8.5)

## 2019-07-17 ENCOUNTER — Encounter: Payer: Self-pay | Admitting: Family Medicine

## 2019-07-28 ENCOUNTER — Encounter: Payer: Self-pay | Admitting: Family Medicine

## 2019-08-25 DIAGNOSIS — K219 Gastro-esophageal reflux disease without esophagitis: Secondary | ICD-10-CM | POA: Diagnosis not present

## 2019-08-25 DIAGNOSIS — R1312 Dysphagia, oropharyngeal phase: Secondary | ICD-10-CM | POA: Diagnosis not present

## 2019-08-25 DIAGNOSIS — D440 Neoplasm of uncertain behavior of thyroid gland: Secondary | ICD-10-CM | POA: Diagnosis not present

## 2019-08-25 DIAGNOSIS — H6123 Impacted cerumen, bilateral: Secondary | ICD-10-CM | POA: Diagnosis not present

## 2019-09-10 ENCOUNTER — Encounter: Payer: Self-pay | Admitting: Family Medicine

## 2019-09-22 DIAGNOSIS — D440 Neoplasm of uncertain behavior of thyroid gland: Secondary | ICD-10-CM | POA: Diagnosis not present

## 2019-09-22 DIAGNOSIS — R1312 Dysphagia, oropharyngeal phase: Secondary | ICD-10-CM | POA: Diagnosis not present

## 2019-09-23 ENCOUNTER — Other Ambulatory Visit: Payer: Self-pay | Admitting: Otolaryngology

## 2019-11-19 ENCOUNTER — Encounter (HOSPITAL_BASED_OUTPATIENT_CLINIC_OR_DEPARTMENT_OTHER): Payer: Self-pay | Admitting: Otolaryngology

## 2019-11-19 ENCOUNTER — Other Ambulatory Visit: Payer: Self-pay

## 2019-11-23 ENCOUNTER — Other Ambulatory Visit (HOSPITAL_COMMUNITY): Payer: BC Managed Care – PPO

## 2019-11-24 ENCOUNTER — Other Ambulatory Visit (HOSPITAL_COMMUNITY)
Admission: RE | Admit: 2019-11-24 | Discharge: 2019-11-24 | Disposition: A | Discharge status: Home / Self Care | Payer: BC Managed Care – PPO | Source: Ambulatory Visit | Attending: Otolaryngology | Admitting: Otolaryngology

## 2019-11-24 ENCOUNTER — Encounter (HOSPITAL_BASED_OUTPATIENT_CLINIC_OR_DEPARTMENT_OTHER)
Admission: RE | Admit: 2019-11-24 | Discharge: 2019-11-24 | Disposition: A | Discharge status: Home / Self Care | Payer: BC Managed Care – PPO | Source: Ambulatory Visit | Attending: Otolaryngology | Admitting: Otolaryngology

## 2019-11-24 DIAGNOSIS — J45909 Unspecified asthma, uncomplicated: Secondary | ICD-10-CM | POA: Diagnosis not present

## 2019-11-24 DIAGNOSIS — Z7984 Long term (current) use of oral hypoglycemic drugs: Secondary | ICD-10-CM | POA: Diagnosis not present

## 2019-11-24 DIAGNOSIS — R1310 Dysphagia, unspecified: Secondary | ICD-10-CM | POA: Diagnosis not present

## 2019-11-24 DIAGNOSIS — Z6841 Body Mass Index (BMI) 40.0 and over, adult: Secondary | ICD-10-CM | POA: Diagnosis not present

## 2019-11-24 DIAGNOSIS — Z79899 Other long term (current) drug therapy: Secondary | ICD-10-CM | POA: Diagnosis not present

## 2019-11-24 DIAGNOSIS — K219 Gastro-esophageal reflux disease without esophagitis: Secondary | ICD-10-CM | POA: Diagnosis not present

## 2019-11-24 DIAGNOSIS — Z9889 Other specified postprocedural states: Secondary | ICD-10-CM | POA: Diagnosis not present

## 2019-11-24 DIAGNOSIS — I10 Essential (primary) hypertension: Secondary | ICD-10-CM | POA: Diagnosis not present

## 2019-11-24 DIAGNOSIS — E042 Nontoxic multinodular goiter: Secondary | ICD-10-CM | POA: Diagnosis not present

## 2019-11-24 DIAGNOSIS — Z01812 Encounter for preprocedural laboratory examination: Secondary | ICD-10-CM | POA: Insufficient documentation

## 2019-11-24 LAB — SARS CORONAVIRUS 2 (TAT 6-24 HRS): SARS Coronavirus 2: NEGATIVE

## 2019-11-24 LAB — BASIC METABOLIC PANEL
Anion gap: 10 (ref 5–15)
BUN: 10 mg/dL (ref 6–20)
CO2: 22 mmol/L (ref 22–32)
Calcium: 9.4 mg/dL (ref 8.9–10.3)
Chloride: 105 mmol/L (ref 98–111)
Creatinine, Ser: 0.7 mg/dL (ref 0.44–1.00)
GFR calc Af Amer: >60 mL/min (ref >60)
GFR calc non Af Amer: >60 mL/min (ref >60)
Glucose, Bld: 119 mg/dL — ABNORMAL HIGH (ref 70–99)
Potassium: 4.1 mmol/L (ref 3.5–5.1)
Sodium: 137 mmol/L (ref 135–145)

## 2019-11-24 LAB — POCT PREGNANCY, URINE: Preg Test, Ur: NEGATIVE

## 2019-11-25 ENCOUNTER — Encounter: Payer: Self-pay | Admitting: Family Medicine

## 2019-11-25 ENCOUNTER — Other Ambulatory Visit: Payer: Self-pay | Admitting: Family Medicine

## 2019-11-25 DIAGNOSIS — R1013 Epigastric pain: Secondary | ICD-10-CM

## 2019-11-25 MED ORDER — FAMOTIDINE 20 MG PO TABS: 20.0000 mg | ORAL_TABLET | Freq: Two times a day (BID) | ORAL | 3 refills | Status: DC

## 2019-11-27 ENCOUNTER — Encounter (HOSPITAL_BASED_OUTPATIENT_CLINIC_OR_DEPARTMENT_OTHER)
Admission: RE | Disposition: A | Discharge status: Home / Self Care | Payer: Self-pay | Source: Home / Self Care | Attending: Otolaryngology

## 2019-11-27 ENCOUNTER — Encounter (HOSPITAL_BASED_OUTPATIENT_CLINIC_OR_DEPARTMENT_OTHER): Payer: Self-pay | Admitting: Otolaryngology

## 2019-11-27 ENCOUNTER — Other Ambulatory Visit: Payer: Self-pay

## 2019-11-27 ENCOUNTER — Ambulatory Visit (HOSPITAL_BASED_OUTPATIENT_CLINIC_OR_DEPARTMENT_OTHER)
Admission: RE | Admit: 2019-11-27 | Discharge: 2019-11-28 | Disposition: A | Discharge status: Home / Self Care | Payer: BC Managed Care – PPO | Attending: Otolaryngology | Admitting: Otolaryngology

## 2019-11-27 ENCOUNTER — Ambulatory Visit (HOSPITAL_BASED_OUTPATIENT_CLINIC_OR_DEPARTMENT_OTHER): Payer: BC Managed Care – PPO | Admitting: Anesthesiology

## 2019-11-27 DIAGNOSIS — D440 Neoplasm of uncertain behavior of thyroid gland: Secondary | ICD-10-CM | POA: Diagnosis not present

## 2019-11-27 DIAGNOSIS — E890 Postprocedural hypothyroidism: Secondary | ICD-10-CM

## 2019-11-27 DIAGNOSIS — R1310 Dysphagia, unspecified: Secondary | ICD-10-CM | POA: Diagnosis not present

## 2019-11-27 DIAGNOSIS — I10 Essential (primary) hypertension: Secondary | ICD-10-CM | POA: Diagnosis not present

## 2019-11-27 DIAGNOSIS — J45909 Unspecified asthma, uncomplicated: Secondary | ICD-10-CM | POA: Insufficient documentation

## 2019-11-27 DIAGNOSIS — E119 Type 2 diabetes mellitus without complications: Secondary | ICD-10-CM | POA: Diagnosis not present

## 2019-11-27 DIAGNOSIS — K219 Gastro-esophageal reflux disease without esophagitis: Secondary | ICD-10-CM | POA: Diagnosis not present

## 2019-11-27 DIAGNOSIS — Z9889 Other specified postprocedural states: Secondary | ICD-10-CM | POA: Diagnosis not present

## 2019-11-27 DIAGNOSIS — Z7984 Long term (current) use of oral hypoglycemic drugs: Secondary | ICD-10-CM | POA: Diagnosis not present

## 2019-11-27 DIAGNOSIS — Z79899 Other long term (current) drug therapy: Secondary | ICD-10-CM | POA: Insufficient documentation

## 2019-11-27 DIAGNOSIS — E049 Nontoxic goiter, unspecified: Secondary | ICD-10-CM | POA: Diagnosis not present

## 2019-11-27 DIAGNOSIS — E042 Nontoxic multinodular goiter: Secondary | ICD-10-CM | POA: Insufficient documentation

## 2019-11-27 DIAGNOSIS — Z6841 Body Mass Index (BMI) 40.0 and over, adult: Secondary | ICD-10-CM | POA: Diagnosis not present

## 2019-11-27 HISTORY — DX: Personal history of COVID-19: Z86.16

## 2019-11-27 HISTORY — DX: Unspecified asthma, uncomplicated: J45.909

## 2019-11-27 HISTORY — DX: Gastro-esophageal reflux disease without esophagitis: K21.9

## 2019-11-27 HISTORY — PX: THYROIDECTOMY: SHX17

## 2019-11-27 HISTORY — DX: Anemia, unspecified: D64.9

## 2019-11-27 HISTORY — DX: Type 2 diabetes mellitus without complications: E11.9

## 2019-11-27 LAB — POCT PREGNANCY, URINE: Preg Test, Ur: NEGATIVE

## 2019-11-27 LAB — GLUCOSE, CAPILLARY
Glucose-Capillary: 131 mg/dL — ABNORMAL HIGH (ref 70–99)
Glucose-Capillary: 150 mg/dL — ABNORMAL HIGH (ref 70–99)

## 2019-11-27 SURGERY — THYROIDECTOMY
Anesthesia: General | Site: Neck | Laterality: Right

## 2019-11-27 MED ORDER — ONDANSETRON HCL 4 MG/2ML IJ SOLN
INTRAMUSCULAR | Status: DC | PRN
  Administered 2019-11-27: 4 mg via INTRAVENOUS

## 2019-11-27 MED ORDER — PROPOFOL 10 MG/ML IV BOLUS
INTRAVENOUS | Status: DC | PRN
  Administered 2019-11-27: 40 mg via INTRAVENOUS
  Administered 2019-11-27: 200 mg via INTRAVENOUS

## 2019-11-27 MED ORDER — PHENYLEPHRINE 40 MCG/ML (10ML) SYRINGE FOR IV PUSH (FOR BLOOD PRESSURE SUPPORT)
PREFILLED_SYRINGE | INTRAVENOUS | Status: DC | PRN
  Administered 2019-11-27 (×4): 80 ug via INTRAVENOUS

## 2019-11-27 MED ORDER — FAMOTIDINE 20 MG PO TABS
20.0000 mg | ORAL_TABLET | Freq: Two times a day (BID) | ORAL | Status: DC
  Administered 2019-11-27: 20 mg via ORAL
  Filled 2019-11-27: qty 1

## 2019-11-27 MED ORDER — FENTANYL CITRATE (PF) 100 MCG/2ML IJ SOLN
INTRAMUSCULAR | Status: AC
  Filled 2019-11-27: qty 2

## 2019-11-27 MED ORDER — OXYCODONE-ACETAMINOPHEN 5-325 MG PO TABS: 1.0000 | ORAL_TABLET | ORAL | 0 refills | Status: AC | PRN

## 2019-11-27 MED ORDER — SUCCINYLCHOLINE CHLORIDE 20 MG/ML IJ SOLN
INTRAMUSCULAR | Status: DC | PRN
  Administered 2019-11-27: 140 mg via INTRAVENOUS

## 2019-11-27 MED ORDER — ROCURONIUM BROMIDE 10 MG/ML (PF) SYRINGE: PREFILLED_SYRINGE | INTRAVENOUS | Status: DC | PRN

## 2019-11-27 MED ORDER — ACETAMINOPHEN 500 MG PO TABS
1000.0000 mg | ORAL_TABLET | Freq: Once | ORAL | Status: AC
  Administered 2019-11-27: 1000 mg via ORAL

## 2019-11-27 MED ORDER — ACETAMINOPHEN 80 MG RE SUPP: 650.0000 mg | RECTAL | Status: DC | PRN

## 2019-11-27 MED ORDER — MIDAZOLAM HCL 5 MG/5ML IJ SOLN
INTRAMUSCULAR | Status: DC | PRN
  Administered 2019-11-27: 2 mg via INTRAVENOUS

## 2019-11-27 MED ORDER — MORPHINE SULFATE (PF) 4 MG/ML IV SOLN: 2.0000 mg | INTRAVENOUS | Status: DC | PRN

## 2019-11-27 MED ORDER — AMLODIPINE BESYLATE 5 MG PO TABS: 5.0000 mg | ORAL_TABLET | Freq: Every day | ORAL | Status: DC

## 2019-11-27 MED ORDER — GLYCOPYRROLATE PF 0.2 MG/ML IJ SOSY
PREFILLED_SYRINGE | INTRAMUSCULAR | Status: AC
  Filled 2019-11-27: qty 1

## 2019-11-27 MED ORDER — CARVEDILOL 25 MG PO TABS: 25.0000 mg | ORAL_TABLET | Freq: Two times a day (BID) | ORAL | Status: DC

## 2019-11-27 MED ORDER — 0.9 % SODIUM CHLORIDE (POUR BTL) OPTIME
TOPICAL | Status: DC | PRN
  Administered 2019-11-27: 120 mL

## 2019-11-27 MED ORDER — ATORVASTATIN CALCIUM 40 MG PO TABS: 40.0000 mg | ORAL_TABLET | Freq: Every day | ORAL | Status: DC

## 2019-11-27 MED ORDER — ACETAMINOPHEN 160 MG/5ML PO SOLN: 650.0000 mg | ORAL | Status: DC | PRN

## 2019-11-27 MED ORDER — GLYCOPYRROLATE 0.2 MG/ML IJ SOLN
INTRAMUSCULAR | Status: DC | PRN
  Administered 2019-11-27: .2 mg via INTRAVENOUS

## 2019-11-27 MED ORDER — PROMETHAZINE HCL 25 MG/ML IJ SOLN
INTRAMUSCULAR | Status: AC
  Filled 2019-11-27: qty 1

## 2019-11-27 MED ORDER — CEFAZOLIN SODIUM-DEXTROSE 2-3 GM-%(50ML) IV SOLR
INTRAVENOUS | Status: DC | PRN
Start: 2019-11-27 — End: 2019-11-27
  Administered 2019-11-27: 2 g via INTRAVENOUS

## 2019-11-27 MED ORDER — DEXAMETHASONE SODIUM PHOSPHATE 4 MG/ML IJ SOLN
INTRAMUSCULAR | Status: DC | PRN
  Administered 2019-11-27: 10 mg via INTRAVENOUS

## 2019-11-27 MED ORDER — ONDANSETRON HCL 4 MG/2ML IJ SOLN
INTRAMUSCULAR | Status: AC
  Filled 2019-11-27: qty 4

## 2019-11-27 MED ORDER — PHENYLEPHRINE HCL-NACL 10-0.9 MG/250ML-% IV SOLN
INTRAVENOUS | Status: DC | PRN
Start: 2019-11-27 — End: 2019-11-27
  Administered 2019-11-27: 50 ug/min via INTRAVENOUS

## 2019-11-27 MED ORDER — CELECOXIB 200 MG PO CAPS
ORAL_CAPSULE | ORAL | Status: AC
  Filled 2019-11-27: qty 1

## 2019-11-27 MED ORDER — FENTANYL CITRATE (PF) 100 MCG/2ML IJ SOLN: 25.0000 ug | INTRAMUSCULAR | Status: DC | PRN

## 2019-11-27 MED ORDER — CEFAZOLIN SODIUM-DEXTROSE 2-4 GM/100ML-% IV SOLN
INTRAVENOUS | Status: AC
  Filled 2019-11-27: qty 100

## 2019-11-27 MED ORDER — FLUTICASONE PROPIONATE 50 MCG/ACT NA SUSP: 2.0000 | Freq: Every day | NASAL | Status: DC | PRN

## 2019-11-27 MED ORDER — ACETAMINOPHEN 500 MG PO TABS
ORAL_TABLET | ORAL | Status: AC
  Filled 2019-11-27: qty 2

## 2019-11-27 MED ORDER — METFORMIN HCL ER 500 MG PO TB24: 500.0000 mg | ORAL_TABLET | Freq: Two times a day (BID) | ORAL | Status: DC

## 2019-11-27 MED ORDER — FENTANYL CITRATE (PF) 100 MCG/2ML IJ SOLN
INTRAMUSCULAR | Status: DC | PRN
  Administered 2019-11-27 (×4): 50 ug via INTRAVENOUS

## 2019-11-27 MED ORDER — AMOXICILLIN 875 MG PO TABS: 875.0000 mg | ORAL_TABLET | Freq: Two times a day (BID) | ORAL | 0 refills | Status: AC

## 2019-11-27 MED ORDER — LIDOCAINE-EPINEPHRINE 1 %-1:100000 IJ SOLN
INTRAMUSCULAR | Status: DC | PRN
  Administered 2019-11-27: 3 mL

## 2019-11-27 MED ORDER — LACTATED RINGERS IV SOLN: INTRAVENOUS | Status: DC

## 2019-11-27 MED ORDER — LIDOCAINE 2% (20 MG/ML) 5 ML SYRINGE
INTRAMUSCULAR | Status: AC
  Filled 2019-11-27: qty 5

## 2019-11-27 MED ORDER — EPHEDRINE SULFATE-NACL 50-0.9 MG/10ML-% IV SOSY
PREFILLED_SYRINGE | INTRAVENOUS | Status: DC | PRN
  Administered 2019-11-27 (×3): 10 mg via INTRAVENOUS

## 2019-11-27 MED ORDER — SUCCINYLCHOLINE CHLORIDE 200 MG/10ML IV SOSY
PREFILLED_SYRINGE | INTRAVENOUS | Status: AC
  Filled 2019-11-27: qty 10

## 2019-11-27 MED ORDER — SCOPOLAMINE 1 MG/3DAYS TD PT72
1.0000 | MEDICATED_PATCH | TRANSDERMAL | Status: DC
  Administered 2019-11-27: 1.5 mg via TRANSDERMAL

## 2019-11-27 MED ORDER — PROMETHAZINE HCL 25 MG/ML IJ SOLN
6.2500 mg | INTRAMUSCULAR | Status: DC | PRN
  Administered 2019-11-27: 6.25 mg via INTRAVENOUS

## 2019-11-27 MED ORDER — DEXAMETHASONE SODIUM PHOSPHATE 10 MG/ML IJ SOLN
INTRAMUSCULAR | Status: AC
  Filled 2019-11-27: qty 2

## 2019-11-27 MED ORDER — LIDOCAINE 2% (20 MG/ML) 5 ML SYRINGE
INTRAMUSCULAR | Status: DC | PRN
  Administered 2019-11-27: 100 mg via INTRAVENOUS

## 2019-11-27 MED ORDER — FUROSEMIDE 20 MG PO TABS: 20.0000 mg | ORAL_TABLET | Freq: Every day | ORAL | Status: DC

## 2019-11-27 MED ORDER — HEMOSTATIC AGENTS (NO CHARGE) OPTIME
TOPICAL | Status: DC | PRN
  Administered 2019-11-27: 1 via TOPICAL

## 2019-11-27 MED ORDER — MIDAZOLAM HCL 2 MG/2ML IJ SOLN
INTRAMUSCULAR | Status: AC
  Filled 2019-11-27: qty 2

## 2019-11-27 MED ORDER — CELECOXIB 200 MG PO CAPS
200.0000 mg | ORAL_CAPSULE | Freq: Once | ORAL | Status: AC
  Administered 2019-11-27: 200 mg via ORAL

## 2019-11-27 MED ORDER — SCOPOLAMINE 1 MG/3DAYS TD PT72
MEDICATED_PATCH | TRANSDERMAL | Status: AC
  Filled 2019-11-27: qty 1

## 2019-11-27 MED ORDER — KCL IN DEXTROSE-NACL 20-5-0.45 MEQ/L-%-% IV SOLN
INTRAVENOUS | Status: DC
  Filled 2019-11-27: qty 1000

## 2019-11-27 MED ORDER — OXYCODONE-ACETAMINOPHEN 5-325 MG PO TABS
1.0000 | ORAL_TABLET | ORAL | Status: DC | PRN
  Administered 2019-11-27 – 2019-11-28 (×3): 1 via ORAL
  Filled 2019-11-27 (×3): qty 1

## 2019-11-27 SURGICAL SUPPLY — 64 items
ADH SKN CLS APL DERMABOND .7 (GAUZE/BANDAGES/DRESSINGS) ×1
ATTRACTOMAT 16X20 MAGNETIC DRP (DRAPES) IMPLANT
BLADE CLIPPER SURG (BLADE) IMPLANT
BLADE SURG 10 STRL SS (BLADE) IMPLANT
BLADE SURG 15 STRL LF DISP TIS (BLADE) ×1 IMPLANT
BLADE SURG 15 STRL SS (BLADE) ×2
CANISTER SUCT 1200ML W/VALVE (MISCELLANEOUS) ×2 IMPLANT
CLIP VESOCCLUDE SM WIDE 6/CT (CLIP) ×1 IMPLANT
CORD BIPOLAR FORCEPS 12FT (ELECTRODE) ×2 IMPLANT
COVER BACK TABLE 60X90IN (DRAPES) ×2 IMPLANT
COVER MAYO STAND STRL (DRAPES) ×2 IMPLANT
COVER WAND RF STERILE (DRAPES) IMPLANT
DECANTER SPIKE VIAL GLASS SM (MISCELLANEOUS) IMPLANT
DERMABOND ADVANCED (GAUZE/BANDAGES/DRESSINGS) ×1
DERMABOND ADVANCED .7 DNX12 (GAUZE/BANDAGES/DRESSINGS) ×1 IMPLANT
DRAIN CHANNEL 10F 3/8 F FF (DRAIN) ×1 IMPLANT
DRAPE U-SHAPE 76X120 STRL (DRAPES) ×2 IMPLANT
ELECT COATED BLADE 2.86 ST (ELECTRODE) ×2 IMPLANT
ELECT REM PT RETURN 9FT ADLT (ELECTROSURGICAL) ×2
ELECTRODE REM PT RTRN 9FT ADLT (ELECTROSURGICAL) ×1 IMPLANT
EVACUATOR SILICONE 100CC (DRAIN) ×1 IMPLANT
FORCEPS BIPOLAR SPETZLER 8 1.0 (NEUROSURGERY SUPPLIES) ×2 IMPLANT
GAUZE 4X4 16PLY RFD (DISPOSABLE) ×3 IMPLANT
GAUZE SPONGE 4X4 12PLY STRL LF (GAUZE/BANDAGES/DRESSINGS) IMPLANT
GLOVE BIO SURGEON STRL SZ 6.5 (GLOVE) IMPLANT
GLOVE BIO SURGEON STRL SZ7.5 (GLOVE) ×2 IMPLANT
GLOVE BIOGEL PI IND STRL 6.5 (GLOVE) IMPLANT
GLOVE BIOGEL PI IND STRL 7.0 (GLOVE) IMPLANT
GLOVE BIOGEL PI INDICATOR 6.5 (GLOVE) ×1
GLOVE BIOGEL PI INDICATOR 7.0 (GLOVE) ×2
GLOVE ECLIPSE 6.5 STRL STRAW (GLOVE) ×1 IMPLANT
GOWN STRL REUS W/ TWL LRG LVL3 (GOWN DISPOSABLE) ×2 IMPLANT
GOWN STRL REUS W/TWL LRG LVL3 (GOWN DISPOSABLE) ×6
HEMOSTAT SNOW SURGICEL 2X4 (HEMOSTASIS) ×1 IMPLANT
HEMOSTAT SURGICEL 2X14 (HEMOSTASIS) IMPLANT
NDL HYPO 25X1 1.5 SAFETY (NEEDLE) ×1 IMPLANT
NEEDLE HYPO 25X1 1.5 SAFETY (NEEDLE) ×2 IMPLANT
NS IRRIG 1000ML POUR BTL (IV SOLUTION) ×2 IMPLANT
PENCIL SMOKE EVACUATOR (MISCELLANEOUS) ×2 IMPLANT
PIN SAFETY STERILE (MISCELLANEOUS) IMPLANT
PROBE NERVBE PRASS .33 (MISCELLANEOUS) ×2 IMPLANT
SET BASIN DAY SURGERY F.S. (CUSTOM PROCEDURE TRAY) ×2 IMPLANT
SHEARS HARMONIC 9CM CVD (BLADE) ×2 IMPLANT
SLEEVE SCD COMPRESS KNEE MED (MISCELLANEOUS) ×2 IMPLANT
SPONGE INTESTINAL PEANUT (DISPOSABLE) ×2 IMPLANT
STAPLER VISISTAT 35W (STAPLE) IMPLANT
SUT ETHILON 3 0 PS 1 (SUTURE) ×2 IMPLANT
SUT PROLENE 5 0 P 3 (SUTURE) IMPLANT
SUT SILK 2 0 SH (SUTURE) ×2 IMPLANT
SUT SILK 2 0 TIES 17X18 (SUTURE) ×2
SUT SILK 2-0 18XBRD TIE BLK (SUTURE) IMPLANT
SUT SILK 3 0 TIES 17X18 (SUTURE) ×2
SUT SILK 3-0 18XBRD TIE BLK (SUTURE) ×2 IMPLANT
SUT VIC AB 3-0 FS2 27 (SUTURE) ×2 IMPLANT
SUT VICRYL 4-0 PS2 18IN ABS (SUTURE) ×2 IMPLANT
SYR BULB EAR ULCER 3OZ GRN STR (SYRINGE) ×2 IMPLANT
SYR CONTROL 10ML LL (SYRINGE) ×2 IMPLANT
TOWEL GREEN STERILE FF (TOWEL DISPOSABLE) ×4 IMPLANT
TRAY DSU PREP LF (CUSTOM PROCEDURE TRAY) ×2 IMPLANT
TUBE CONNECTING 20X1/4 (TUBING) ×2 IMPLANT
TUBE ENDOTRAC NIMS EMG 6MM (MISCELLANEOUS) IMPLANT
TUBE ENDOTRAC NIMS EMG 7MM (MISCELLANEOUS) ×1 IMPLANT
TUBE ENDOTRAC NIMS EMG 8MM (MISCELLANEOUS) IMPLANT
TUBE ENDOTRAC NIMS EMG 9MM (MISCELLANEOUS) IMPLANT

## 2019-11-27 NOTE — Anesthesia Procedure Notes (Signed)
Procedure Name: Intubation Date/Time: 11/27/2019 9:52 AM Performed by: Caren Macadam, CRNA Pre-anesthesia Checklist: Patient identified, Emergency Drugs available, Suction available and Patient being monitored Patient Re-evaluated:Patient Re-evaluated prior to induction Oxygen Delivery Method: Circle system utilized Induction Type: Inhalational induction Ventilation: Mask ventilation without difficulty and Oral airway inserted - appropriate to patient size Laryngoscope Size: Miller and 2 Grade View: Grade I Tube type: Oral Tube size: 7.0 mm Number of attempts: 1 Airway Equipment and Method: Stylet Placement Confirmation: ETT inserted through vocal cords under direct vision,  positive ETCO2 and breath sounds checked- equal and bilateral Secured at: 22 cm Tube secured with: Tape Dental Injury: Teeth and Oropharynx as per pre-operative assessment

## 2019-11-27 NOTE — H&P (Signed)
Cc: Multinodular thyroid goiter, dysphagia  HPI: The patient is a 49 y/o female who returns today for follow up evaluation of a multinodular thyroid goiter and clogging sensation in both ears. The patient was last seen 4 weeks ago. Her ultrasound showed 10 nodules within the thyroid gland. The largest was noted on the right measuring 3.7 cm with a 2.2 cm nodule noted at the right isthmus. At her last visit, the patient was experiencing compressive symptoms. Laryngoscopy showed findings consistent with laryngopharyngeal reflux. The patient was placed on a trial of famotidine to see if it would help with her dysphagia and better determine the course of action in regard to her thyroid. The patient returns today noting no improvement in her dysphagia. She still gets choked frequently. No other ENT, GI, or respiratory issue noted since the last visit.   Exam: General: Communicates without difficulty, well nourished, no acute distress. Head: Normocephalic, no evidence injury, no tenderness, facial buttresses intact without stepoff. Eyes: PERRL, EOMI. No scleral icterus, conjunctivae clear. Neuro: CN II exam reveals vision grossly intact. No nystagmus at any point of gaze. Ears: Auricles well formed without lesions.  Ear canals are intact without mass or lesion.  No erythema or edema is appreciated.  The TMs are intact without fluid. Nose: External evaluation reveals normal support and skin without lesions.  Dorsum is intact.  Anterior rhinoscopy reveals healthy pink mucosa over anterior aspect of inferior turbinates and intact septum.  No purulence noted. Oral:  Oral cavity and oropharynx are intact, symmetric, without erythema or edema.  Mucosa is moist without lesions. Neck: Full range of motion without pain. There is no significant lymphadenopathy. No masses palpable. Thyroid bed enlarged. Parotid glands and submandibular glands equal bilaterally without mass. Trachea is midline. Neuro:  CN 2-12 grossly intact.  Gait normal. The cerebellar examination is unremarkable.   Assessment 1. Thyroid ultrasound showed 10 nodules within the thyroid gland. The largest nodule was noted on the right measuring 3.7 cm with a 2.2 cm nodule noted at the right isthmus.  2. The patient is currently experiencing frequent compressive symptoms.   Plan  1. The physical exam and ultrasound findings are reviewed with the patient.  2. In light of the patient's persistent symptoms she would like benefit from right hemithyroidectomy. The risks, benefits, alternatives, and details of the procedure are reviewed with the patient. Questions are invited and answered. 3. The patient is interested in proceeding with the procedure.  We will schedule the procedure in accordance with the family schedule.

## 2019-11-27 NOTE — Anesthesia Preprocedure Evaluation (Addendum)
Anesthesia Evaluation  Patient identified by MRN, date of birth, ID band Patient awake    Reviewed: Allergy & Precautions, NPO status , Patient's Chart, lab work & pertinent test results, reviewed documented beta blocker date and time   History of Anesthesia Complications Negative for: history of anesthetic complications  Airway Mallampati: II  TM Distance: >3 FB Neck ROM: Full    Dental no notable dental hx. (+) Dental Advisory Given   Pulmonary asthma ,    Pulmonary exam normal        Cardiovascular hypertension, Pt. on medications and Pt. on home beta blockers Normal cardiovascular exam     Neuro/Psych negative neurological ROS     GI/Hepatic Neg liver ROS, GERD  ,  Endo/Other  diabetesMorbid obesity  Renal/GU negative Renal ROS     Musculoskeletal negative musculoskeletal ROS (+)   Abdominal   Peds  Hematology negative hematology ROS (+)   Anesthesia Other Findings   Reproductive/Obstetrics                            Anesthesia Physical Anesthesia Plan  ASA: III  Anesthesia Plan: General   Post-op Pain Management:    Induction: Intravenous  PONV Risk Score and Plan: 4 or greater and Ondansetron, Dexamethasone, Scopolamine patch - Pre-op and Midazolam  Airway Management Planned: Oral ETT  Additional Equipment:   Intra-op Plan:   Post-operative Plan: Extubation in OR  Informed Consent: I have reviewed the patients History and Physical, chart, labs and discussed the procedure including the risks, benefits and alternatives for the proposed anesthesia with the patient or authorized representative who has indicated his/her understanding and acceptance.     Dental advisory given  Plan Discussed with: Anesthesiologist, CRNA and Surgeon  Anesthesia Plan Comments:        Anesthesia Quick Evaluation

## 2019-11-27 NOTE — Discharge Instructions (Signed)
Thyroidectomy, Care After This sheet gives you information about how to care for yourself after your procedure. Your health care provider may also give you more specific instructions. If you have problems or questions, contact your health care provider. What can I expect after the procedure? After the procedure, it is common to have:  Mild pain in the neck or upper body, especially when swallowing.  A swollen neck.  A sore throat.  A weak or hoarse voice.  Slight tingling or numbness around your mouth, or in your fingers or toes. This may last for a day or two after surgery. This condition is caused by low levels of calcium. You may be given calcium supplements to treat it. Follow these instructions at home:  Medicines  Take over-the-counter and prescription medicines only as told by your health care provider.  Do not drive or use heavy machinery while taking prescription pain medicine.  Do not take medicines that contain aspirin and ibuprofen until your health care provider says that you can. These medicines can increase your risk of bleeding. Eating and drinking  Start slowly with eating. You may need to have only liquids and soft foods for a few days or as directed by your health care provider.  To prevent or treat constipation while you are taking prescription pain medicine, your health care provider may recommend that you: ? Drink enough fluid to keep your urine pale yellow. ? Take over-the-counter or prescription medicines. ? Eat foods that are high in fiber, such as fresh fruits and vegetables, whole grains, and beans. ? Limit foods that are high in fat and processed sugars, such as fried and sweet foods. Incision care  Follow instructions from your health care provider about how to take care of your incision. Make sure you: ? Wash your hands with soap and water before you change your bandage (dressing). If soap and water are not available, use hand sanitizer. ? Leave  stitches (sutures), skin glue, or adhesive strips in place. These skin closures may need to stay in place for 2 weeks or longer. If adhesive strip edges start to loosen and curl up, you may trim the loose edges. Do not remove adhesive strips completely unless your health care provider tells you to do that.  Check your incision area every day for signs of infection. Check for: ? Redness, swelling, or pain. ? Fluid or blood. ? Warmth. ? Pus or a bad smell. Activity  For the first 10 days after the procedure or as instructed by your health care provider: ? Do not lift anything that is heavier than 10 lb (4.5 kg). ? Do not jog, swim, or do other strenuous exercises. ? Do not play contact sports.  Avoid sitting for a long time without moving. Get up to take short walks every 1-2 hours. This is needed to improve blood flow and breathing. Ask for help if you feel weak or unsteady.  Return to your normal activities as told by your health care provider. Ask your health care provider what activities are safe for you. General instructions  Do not use any products that contain nicotine or tobacco, such as cigarettes and e-cigarettes. These can delay healing after surgery. If you need help quitting, ask your health care provider.  Keep all follow-up visits as told by your health care provider. This is important. Your health care provider needs to monitor the calcium level in your blood to make sure that it does not become low. Contact a health care  provider if you:  Have a fever.  Have more redness, swelling, or pain around your incision area.  Have fluid or blood coming from your incision area.  Notice that your incision area feels warm to the touch.  Have pus or a bad smell coming from your incision area.  Have trouble talking.  Have nausea or vomiting for more than 2 days. Get help right away if you:  Have trouble breathing.  Have trouble swallowing.  Develop a rash.  Develop a  cough that gets worse.  Notice that your speech changes, or you have hoarseness that gets worse.  Develop numbness, tingling, or muscle spasms in the arms, hands, feet, or face. Summary  After the procedure, it is common to feel mild pain in the neck or upper body, especially when swallowing.  Take medicines as told by your health care provider. These include pain medicines and thyroid hormones, if required.  Follow instructions from your health care provider about how to take care of your incision. Watch for signs of infection.  Keep all follow-up visits as told by your health care provider. This is important. Your health care provider needs to monitor the calcium level in your blood to make sure that it does not become low.  Get help right away if you develop difficulty breathing, or numbness, tingling, or muscle spasms in the arms, hands, feet, or face. This information is not intended to replace advice given to you by your health care provider. Make sure you discuss any questions you have with your health care provider. Document Revised: 07/31/2018 Document Reviewed: 04/09/2017 Elsevier Patient Education  2020 [employer].

## 2019-11-27 NOTE — Anesthesia Postprocedure Evaluation (Signed)
Anesthesia Post Note  Patient: Michele Tran  Procedure(s) Performed: RIGHT HEMI THYROIDECTOMY (Right Neck)     Patient location during evaluation: PACU Anesthesia Type: General Level of consciousness: sedated Pain management: pain level controlled Vital Signs Assessment: post-procedure vital signs reviewed and stable Respiratory status: spontaneous breathing and respiratory function stable Cardiovascular status: stable Postop Assessment: no apparent nausea or vomiting Anesthetic complications: no   No complications documented.  Last Vitals:  Vitals:   11/27/19 1310 11/27/19 1350  BP: 128/79   Pulse: 80 82  Resp: 18   Temp: 36.4 C   SpO2: 99% 100%    Last Pain:  Vitals:   11/27/19 1310  TempSrc:   PainSc: 3                  Dickey Caamano DANIEL

## 2019-11-27 NOTE — Op Note (Signed)
DATE OF PROCEDURE:  11/27/2019                              OPERATIVE REPORT  SURGEON:  Newman Pies, MD  PREOPERATIVE DIAGNOSES: 1. Large right thyroid nodules. 2. Dysphagia.  POSTOPERATIVE DIAGNOSES: 1. Large right thyroid nodules. 2. Dysphagia.  PROCEDURE PERFORMED:  Right hemithyroidectomy  ANESTHESIA:  General endotracheal tube anesthesia.  COMPLICATIONS:  None.  ESTIMATED BLOOD LOSS:  50 ml  INDICATION FOR PROCEDURE:  Michele Tran is a 50 y.o. female with a history of a multinodular thyroid goiter and dysphagia. The patient's ultrasound showed 10 nodules within the thyroid gland. The largest was noted on the right measuring 3.7 cm with a 2.2 cm nodule noted at the right isthmus. It has resulted in significant compressive effect. Based on the above findings, the decision was made for the patient to undergo the above stated procedure. Likelihood of success in reducing symptoms was also discussed.  The risks, benefits, alternatives, and details of the procedure were discussed with the patient.  Questions were invited and answered.  Informed consent was obtained.  DESCRIPTION:  The patient was taken to the operating room and placed supine on the operating table.  General endotracheal tube anesthesia was administered by the anesthesiologist.  The patient was positioned and prepped and draped in a standard fashion for thyroidectomy.    1% lidocaine with 1-100,000 epinephrine was infiltrated at the planned site of incision.  A transverse lower neck incision was made.  The incision was carried down to the level of the platysma muscles.  Superiorly based and inferiorly based subplatysmal flaps were elevated in the standard fashion.  The strap muscles were divided at midline and retracted laterally, exposing the thyroid gland.  The patient was noted to have a large multinodular thyroid goiter, significantly larger on the right side.  Careful dissection was carried out to free the right thyroid  lobe from the surrounding soft tissue.  An inferior parathyroid gland was identified and preserved.  The right recurrent laryngeal nerve was also identified and preserved.  The recurrent laryngeal nerve was noted to be functional throughout the case.  The entire right thyroid lobe and the isthmus was then resected free from the left thyroid gland.  The specimen was sent to the pathology department for permanent histologic identification.  The surgical site was copiously irrigated.  A #10 JP drain was placed.  The strap muscles were reapproximated with 4-0 Vicryl sutures.  The incision was closed in layers with 4-0 Vicryl and Dermabond.  The care of the patient was turned over to the anesthesiologist.  The patient was awakened from anesthesia without difficulty.  The patient was extubated and transferred to the recovery room in good condition.  OPERATIVE FINDINGS: Multinodular thyroid goiter.  Multiple large nodules were noted on the right side.  SPECIMEN: Right thyroid lobe and isthmus.  FOLLOWUP CARE:  The patient will be observed overnight.  Michele Tran 11/27/2019 12:00 PM Right

## 2019-11-27 NOTE — Transfer of Care (Signed)
Immediate Anesthesia Transfer of Care Note  Patient: Michele Tran  Procedure(s) Performed: RIGHT HEMI THYROIDECTOMY (Right Neck)  Patient Location: PACU  Anesthesia Type:General  Level of Consciousness: drowsy  Airway & Oxygen Therapy: Patient Spontanous Breathing and Patient connected to face mask oxygen  Post-op Assessment: Report given to RN and Post -op Vital signs reviewed and stable  Post vital signs: Reviewed and stable  Last Vitals:  Vitals Value Taken Time  BP 121/74 11/27/19 1209  Temp    Pulse 89 11/27/19 1210  Resp 17 11/27/19 1210  SpO2 100 % 11/27/19 1210  Vitals shown include unvalidated device data.  Last Pain:  Vitals:   11/27/19 0847  TempSrc: Oral  PainSc: 0-No pain         Complications: No complications documented.

## 2019-11-28 DIAGNOSIS — J45909 Unspecified asthma, uncomplicated: Secondary | ICD-10-CM | POA: Diagnosis not present

## 2019-11-28 DIAGNOSIS — E042 Nontoxic multinodular goiter: Secondary | ICD-10-CM | POA: Diagnosis not present

## 2019-11-28 DIAGNOSIS — Z7984 Long term (current) use of oral hypoglycemic drugs: Secondary | ICD-10-CM | POA: Diagnosis not present

## 2019-11-28 DIAGNOSIS — Z6841 Body Mass Index (BMI) 40.0 and over, adult: Secondary | ICD-10-CM | POA: Diagnosis not present

## 2019-11-28 DIAGNOSIS — Z79899 Other long term (current) drug therapy: Secondary | ICD-10-CM | POA: Diagnosis not present

## 2019-11-28 DIAGNOSIS — K219 Gastro-esophageal reflux disease without esophagitis: Secondary | ICD-10-CM | POA: Diagnosis not present

## 2019-11-28 DIAGNOSIS — I10 Essential (primary) hypertension: Secondary | ICD-10-CM | POA: Diagnosis not present

## 2019-11-28 DIAGNOSIS — R1310 Dysphagia, unspecified: Secondary | ICD-10-CM | POA: Diagnosis not present

## 2019-11-28 DIAGNOSIS — Z9889 Other specified postprocedural states: Secondary | ICD-10-CM | POA: Diagnosis not present

## 2019-11-28 NOTE — Discharge Summary (Signed)
Physician Discharge Summary  Patient ID: Michele Tran MRN: [id number] DOB/AGE: [birth year] 49 y.o.  Admit date: 11/27/2019 Discharge date: 11/28/2019  Admission Diagnoses: Thyroid nodules  Discharge Diagnoses: Thyroid nodules Active Problems:   S/P partial thyroidectomy   Discharged Condition: good  Hospital Course: Pt had an uneventful overnight stay. Pt tolerated po well. No bleeding. No stridor.  Consults: None  Significant Diagnostic Studies: None  Treatments: surgery: Right hemithyroidectomy  Discharge Exam: Blood pressure 122/78, pulse 90, temperature 98.5 F (36.9 C), resp. rate 18, height 5' 5.5" (1.664 m), weight 119 kg, last menstrual period 10/26/2019, SpO2 98 %. Incision/Wound:c/d/i Voice is strong.  Disposition: Discharge disposition: 01-Home or Self Care       Discharge Instructions    Activity as tolerated - No restrictions   Complete by: As directed    Activity as tolerated - No restrictions   Complete by: As directed    Diet general   Complete by: As directed    No wound care   Complete by: As directed    No wound care   Complete by: As directed      Allergies as of 11/28/2019      Reactions   Ace Inhibitors Swelling   Swelling   Cherry    Codeine Nausea And Vomiting   Fluviral [flu Virus Vaccine] Other (See Comments)   Asthma exacerabation      Medication List    TAKE these medications   amLODipine 5 MG tablet Commonly known as: NORVASC Take 1 tablet (5 mg total) by mouth daily.   ammonium lactate 12 % cream Commonly known as: Lac-Hydrin Apply topically as needed for dry skin.   amoxicillin 875 MG tablet Commonly known as: AMOXIL Take 1 tablet (875 mg total) by mouth 2 (two) times daily for 3 days.   atorvastatin 40 MG tablet Commonly known as: LIPITOR Take 1 tablet (40 mg total) by mouth daily.   blood glucose meter kit and supplies Dispense based on patient and insurance preference.  Use as directed once a day.  Dx  code: E11.9   carvedilol 25 MG tablet Commonly known as: COREG TAKE 1 TABLET BY MOUTH  TWICE DAILY WITH MEALS   famotidine 20 MG tablet Commonly known as: PEPCID Take 1 tablet (20 mg total) by mouth 2 (two) times daily.   fluticasone 50 MCG/ACT nasal spray Commonly known as: FLONASE Place 2 sprays into both nostrils daily as needed for allergies or rhinitis.   freestyle lancets USE ONCE DAILY AS DIRECTED   furosemide 20 MG tablet Commonly known as: LASIX TAKE 1 TABLET BY MOUTH  DAILY   glucose blood test strip Commonly known as: FREESTYLE LITE Use as instructed once a day.  Dx code:  E11.9   metFORMIN 500 MG 24 hr tablet Commonly known as: GLUCOPHAGE-XR TAKE 2 TABLETS BY MOUTH  EVERY DAY   multivitamin tablet Take 1 tablet by mouth daily.   oxyCODONE-acetaminophen 5-325 MG tablet Commonly known as: Percocet Take 1 tablet by mouth every 4 (four) hours as needed for up to 5 days for severe pain.       Follow-up Information    Leta Baptist, MD On 12/02/2019.   Specialty: Otolaryngology Why: at 1:20pm Contact information: [place] [address] [phone]               Signed: Burley Saver 11/28/2019, 7:17 AM

## 2019-11-30 ENCOUNTER — Encounter (HOSPITAL_BASED_OUTPATIENT_CLINIC_OR_DEPARTMENT_OTHER): Payer: Self-pay | Admitting: Otolaryngology

## 2019-12-01 LAB — SURGICAL PATHOLOGY

## 2019-12-14 ENCOUNTER — Ambulatory Visit (INDEPENDENT_AMBULATORY_CARE_PROVIDER_SITE_OTHER): Payer: BC Managed Care – PPO | Admitting: Family Medicine

## 2019-12-14 ENCOUNTER — Other Ambulatory Visit: Payer: Self-pay | Admitting: Family Medicine

## 2019-12-14 ENCOUNTER — Other Ambulatory Visit: Payer: Self-pay

## 2019-12-14 ENCOUNTER — Encounter: Payer: Self-pay | Admitting: Family Medicine

## 2019-12-14 VITALS — BP 124/90 | HR 77 | Temp 97.5°F | Resp 18 | Ht 65.5 in | Wt 267.6 lb

## 2019-12-14 DIAGNOSIS — E1165 Type 2 diabetes mellitus with hyperglycemia: Secondary | ICD-10-CM

## 2019-12-14 DIAGNOSIS — E785 Hyperlipidemia, unspecified: Secondary | ICD-10-CM | POA: Diagnosis not present

## 2019-12-14 DIAGNOSIS — E890 Postprocedural hypothyroidism: Secondary | ICD-10-CM

## 2019-12-14 DIAGNOSIS — E1169 Type 2 diabetes mellitus with other specified complication: Secondary | ICD-10-CM

## 2019-12-14 DIAGNOSIS — D509 Iron deficiency anemia, unspecified: Secondary | ICD-10-CM

## 2019-12-14 DIAGNOSIS — I10 Essential (primary) hypertension: Secondary | ICD-10-CM

## 2019-12-14 DIAGNOSIS — Z9009 Acquired absence of other part of head and neck: Secondary | ICD-10-CM

## 2019-12-14 LAB — COMPREHENSIVE METABOLIC PANEL
ALT: 16 U/L (ref 0–35)
AST: 17 U/L (ref 0–37)
Albumin: 4.4 g/dL (ref 3.5–5.2)
Alkaline Phosphatase: 66 U/L (ref 39–117)
BUN: 11 mg/dL (ref 6–23)
CO2: 27 mEq/L (ref 19–32)
Calcium: 9.3 mg/dL (ref 8.4–10.5)
Chloride: 101 mEq/L (ref 96–112)
Creatinine, Ser: 0.76 mg/dL (ref 0.40–1.20)
GFR: 98.41 mL/min (ref >60.00)
Glucose, Bld: 114 mg/dL — ABNORMAL HIGH (ref 70–99)
Potassium: 4.1 mEq/L (ref 3.5–5.1)
Sodium: 138 mEq/L (ref 135–145)
Total Bilirubin: 1 mg/dL (ref 0.2–1.2)
Total Protein: 7.8 g/dL (ref 6.0–8.3)

## 2019-12-14 LAB — CBC WITH DIFFERENTIAL/PLATELET
Basophils Absolute: 0 10*3/uL (ref 0.0–0.1)
Basophils Relative: 0.4 % (ref 0.0–3.0)
Eosinophils Absolute: 0.3 10*3/uL (ref 0.0–0.7)
Eosinophils Relative: 4 % (ref 0.0–5.0)
HCT: 33.3 % — ABNORMAL LOW (ref 36.0–46.0)
Hemoglobin: 10.8 g/dL — ABNORMAL LOW (ref 12.0–15.0)
Lymphocytes Relative: 18.1 % (ref 12.0–46.0)
Lymphs Abs: 1.3 10*3/uL (ref 0.7–4.0)
MCHC: 32.5 g/dL (ref 30.0–36.0)
MCV: 79.6 fl (ref 78.0–100.0)
Monocytes Absolute: 0.4 10*3/uL (ref 0.1–1.0)
Monocytes Relative: 6.1 % (ref 3.0–12.0)
Neutro Abs: 5.2 10*3/uL (ref 1.4–7.7)
Neutrophils Relative %: 71.4 % (ref 43.0–77.0)
Platelets: 335 10*3/uL (ref 150.0–400.0)
RBC: 4.18 Mil/uL (ref 3.87–5.11)
RDW: 16.3 % — ABNORMAL HIGH (ref 11.5–15.5)
WBC: 7.2 10*3/uL (ref 4.0–10.5)

## 2019-12-14 LAB — LIPID PANEL
Cholesterol: 152 mg/dL (ref 0–200)
HDL: 57.6 mg/dL (ref >39.00)
LDL Cholesterol: 75 mg/dL (ref 0–99)
NonHDL: 94.31
Total CHOL/HDL Ratio: 3
Triglycerides: 98 mg/dL (ref 0.0–149.0)
VLDL: 19.6 mg/dL (ref 0.0–40.0)

## 2019-12-14 LAB — HEMOGLOBIN A1C: Hgb A1c MFr Bld: 6.8 % — ABNORMAL HIGH (ref 4.6–6.5)

## 2019-12-14 NOTE — Patient Instructions (Signed)
Carbohydrate Counting for Diabetes Mellitus, Adult  Carbohydrate counting is a method of keeping track of how many carbohydrates you eat. Eating carbohydrates naturally increases the amount of sugar (glucose) in the blood. Counting how many carbohydrates you eat helps keep your blood glucose within normal limits, which helps you manage your diabetes (diabetes mellitus). It is important to know how many carbohydrates you can safely have in each meal. This is different for every person. A diet and nutrition specialist (registered dietitian) can help you make a meal plan and calculate how many carbohydrates you should have at each meal and snack. Carbohydrates are found in the following foods:  Grains, such as breads and cereals.  Dried beans and soy products.  Starchy vegetables, such as potatoes, peas, and corn.  Fruit and fruit juices.  Milk and yogurt.  Sweets and snack foods, such as cake, cookies, candy, chips, and soft drinks. How do I count carbohydrates? There are two ways to count carbohydrates in food. You can use either of the methods or a combination of both. Reading "Nutrition Facts" on packaged food The "Nutrition Facts" list is included on the labels of almost all packaged foods and beverages in the [place] It includes:  The serving size.  Information about nutrients in each serving, including the grams (g) of carbohydrate per serving. To use the "Nutrition Facts":  Decide how many servings you will have.  Multiply the number of servings by the number of carbohydrates per serving.  The resulting number is the total amount of carbohydrates that you will be having. Learning standard serving sizes of other foods When you eat carbohydrate foods that are not packaged or do not include "Nutrition Facts" on the label, you need to measure the servings in order to count the amount of carbohydrates:  Measure the foods that you will eat with a food scale or measuring cup, if  needed.  Decide how many standard-size servings you will eat.  Multiply the number of servings by 15. Most carbohydrate-rich foods have about 15 g of carbohydrates per serving. ? For example, if you eat 8 oz (170 g) of strawberries, you will have eaten 2 servings and 30 g of carbohydrates (2 servings x 15 g = 30 g).  For foods that have more than one food mixed, such as soups and casseroles, you must count the carbohydrates in each food that is included. The following list contains standard serving sizes of common carbohydrate-rich foods. Each of these servings has about 15 g of carbohydrates:   hamburger bun or  English muffin.   oz (15 mL) syrup.   oz (14 g) jelly.  1 slice of bread.  1 six-inch tortilla.  3 oz (85 g) cooked rice or pasta.  4 oz (113 g) cooked dried beans.  4 oz (113 g) starchy vegetable, such as peas, corn, or potatoes.  4 oz (113 g) hot cereal.  4 oz (113 g) mashed potatoes or  of a large baked potato.  4 oz (113 g) canned or frozen fruit.  4 oz (120 mL) fruit juice.  4-6 crackers.  6 chicken nuggets.  6 oz (170 g) unsweetened dry cereal.  6 oz (170 g) plain fat-free yogurt or yogurt sweetened with artificial sweeteners.  8 oz (240 mL) milk.  8 oz (170 g) fresh fruit or one small piece of fruit.  24 oz (680 g) popped popcorn. Example of carbohydrate counting Sample meal  3 oz (85 g) chicken breast.  6 oz (170 g)   brown rice.  4 oz (113 g) corn.  8 oz (240 mL) milk.  8 oz (170 g) strawberries with sugar-free whipped topping. Carbohydrate calculation 1. Identify the foods that contain carbohydrates: ? Rice. ? Corn. ? Milk. ? Strawberries. 2. Calculate how many servings you have of each food: ? 2 servings rice. ? 1 serving corn. ? 1 serving milk. ? 1 serving strawberries. 3. Multiply each number of servings by 15 g: ? 2 servings rice x 15 g = 30 g. ? 1 serving corn x 15 g = 15 g. ? 1 serving milk x 15 g = 15 g. ? 1  serving strawberries x 15 g = 15 g. 4. Add together all of the amounts to find the total grams of carbohydrates eaten: ? 30 g + 15 g + 15 g + 15 g = 75 g of carbohydrates total. Summary  Carbohydrate counting is a method of keeping track of how many carbohydrates you eat.  Eating carbohydrates naturally increases the amount of sugar (glucose) in the blood.  Counting how many carbohydrates you eat helps keep your blood glucose within normal limits, which helps you manage your diabetes.  A diet and nutrition specialist (registered dietitian) can help you make a meal plan and calculate how many carbohydrates you should have at each meal and snack. This information is not intended to replace advice given to you by your health care provider. Make sure you discuss any questions you have with your health care provider. Document Revised: 12/27/2016 Document Reviewed: 11/16/2015 Elsevier Patient Education  2020 Elsevier Inc.  

## 2019-12-14 NOTE — Assessment & Plan Note (Signed)
Well controlled, no changes to meds. Encouraged heart healthy diet such as the DASH diet and exercise as tolerated.  °

## 2019-12-14 NOTE — Assessment & Plan Note (Signed)
hgba1c to be checked, minimize simple carbs. Increase exercise as tolerated. Continue current meds  

## 2019-12-14 NOTE — Assessment & Plan Note (Signed)
Encouraged heart healthy diet, increase exercise, avoid trans fats, consider a krill oil cap daily 

## 2019-12-14 NOTE — Progress Notes (Signed)
Patient ID: Michele Tran, female    DOB: [birth year]  Age: 49 y.o. MRN: [id number]    Subjective:  Subjective  HPI Michele Tran presents for f/u dm , chol and htn      She had a thyroidectomy on November 27, 2019.    HYPERTENSION   Blood pressure range-not checking   Chest pain- no      Dyspnea- no Lightheadedness- no   Edema- no  Other side effects - no   Medication compliance: good Low salt diet- yes    DIABETES    Blood Sugar ranges-79-131  Polyuria- no New Visual problems- no  Hypoglycemic symptoms- no  Other side effects-no Medication compliance - good Last eye exam- due Foot exam- today    HYPERLIPIDEMIA  Medication compliance- good RUQ pain- no  Muscle aches- no Other side effects-no       Review of Systems  Constitutional: Negative for appetite change, diaphoresis, fatigue and unexpected weight change.  Eyes: Negative for pain, redness and visual disturbance.  Respiratory: Negative for cough, chest tightness, shortness of breath and wheezing.   Cardiovascular: Negative for chest pain, palpitations and leg swelling.  Endocrine: Negative for cold intolerance, heat intolerance, polydipsia, polyphagia and polyuria.  Genitourinary: Negative for difficulty urinating, dysuria and frequency.  Neurological: Negative for dizziness, light-headedness, numbness and headaches.    History Past Medical History:  Diagnosis Date  . Abdominal bloating   . Anemia   . Asthma   . Diabetes mellitus without complication ([place])    type 2  . GERD (gastroesophageal reflux disease)   . Hirsutism   . History of COVID-19    06/2019  . Hyperlipidemia   . Hypertension   . Obesity, morbid ([place])   . Polycystic ovaries     She has a past surgical history that includes UTERINE polyps (05-31-10) and Thyroidectomy (Right, 11/27/2019).   Her family history includes Arthritis in an other family member; Breast cancer in her paternal aunt; Cancer in her paternal aunt; Cancer (age of  onset: 53) in her father; Coronary artery disease in her paternal uncle; Diabetes in an other family member; Hyperlipidemia in an other family member; Hypertension in an other family member; Leukemia in her paternal uncle; Lung cancer in her paternal aunt; Stroke (age of onset: 82) in her mother.She reports that she has never smoked. She has never used smokeless tobacco. She reports that she does not drink alcohol and does not use drugs.  Current Outpatient Medications on File Prior to Visit  Medication Sig Dispense Refill  . amLODipine (NORVASC) 5 MG tablet Take 1 tablet (5 mg total) by mouth daily. 90 tablet 3  . ammonium lactate (LAC-HYDRIN) 12 % cream Apply topically as needed for dry skin. 385 g 0  . atorvastatin (LIPITOR) 40 MG tablet Take 1 tablet (40 mg total) by mouth daily. 90 tablet 3  . blood glucose meter kit and supplies Dispense based on patient and insurance preference.  Use as directed once a day.  Dx code: E11.9 1 each 0  . carvedilol (COREG) 25 MG tablet TAKE 1 TABLET BY MOUTH  TWICE DAILY WITH MEALS 180 tablet 3  . famotidine (PEPCID) 20 MG tablet Take 1 tablet (20 mg total) by mouth 2 (two) times daily. 90 tablet 3  . fluticasone (FLONASE) 50 MCG/ACT nasal spray Place 2 sprays into both nostrils daily as needed for allergies or rhinitis. 48 g 0  . furosemide (LASIX) 20 MG tablet TAKE 1 TABLET BY MOUTH  DAILY 30 tablet 0  . glucose blood (FREESTYLE LITE) test strip Use as instructed once a day.  Dx code:  E11.9 100 each 1  . Lancets (FREESTYLE) lancets USE ONCE DAILY AS DIRECTED 100 each 1  . metFORMIN (GLUCOPHAGE-XR) 500 MG 24 hr tablet TAKE 2 TABLETS BY MOUTH  EVERY DAY 180 tablet 3  . Multiple Vitamin (MULTIVITAMIN) tablet Take 1 tablet by mouth daily.     No current facility-administered medications on file prior to visit.     Objective:  Objective  Physical Exam Vitals and nursing note reviewed.  Constitutional:      Appearance: She is well-developed.  HENT:      Head: Normocephalic and atraumatic.  Eyes:     Conjunctiva/sclera: Conjunctivae normal.  Neck:     Thyroid: No thyromegaly.     Vascular: No carotid bruit or JVD.     Comments: S/p thyroidectomy-- scar healing well Cardiovascular:     Rate and Rhythm: Normal rate and regular rhythm.     Heart sounds: Normal heart sounds. No murmur heard.   Pulmonary:     Effort: Pulmonary effort is normal. No respiratory distress.     Breath sounds: Normal breath sounds. No wheezing or rales.  Chest:     Chest wall: No tenderness.  Musculoskeletal:     Cervical back: Normal range of motion and neck supple.  Neurological:     Mental Status: She is alert and oriented to person, place, and time.    Diabetic Foot Exam - Simple   Simple Foot Form Diabetic Foot exam was performed with the following findings: Yes 12/14/2019  9:11 AM  Visual Inspection No deformities, no ulcerations, no other skin breakdown bilaterally: Yes Sensation Testing Intact to touch and monofilament testing bilaterally: Yes Pulse Check Posterior Tibialis and Dorsalis pulse intact bilaterally: Yes Comments     BP 124/90 (BP Location: Left Arm, Patient Position: Sitting, Cuff Size: Large)   Pulse 77   Temp (!) 97.5 F (36.4 C) (Temporal)   Resp 18   Ht 5' 5.5" (1.664 m)   Wt 267 lb 9.6 oz (121.4 kg)   SpO2 100%   BMI 43.85 kg/m  Wt Readings from Last 3 Encounters:  12/14/19 267 lb 9.6 oz (121.4 kg)  11/27/19 262 lb 5.6 oz (119 kg)  07/14/19 260 lb 12.8 oz (118.3 kg)     Lab Results  Component Value Date   WBC 8.1 06/16/2019   HGB 11.6 (L) 06/16/2019   HCT 36.3 06/16/2019   PLT 320.0 06/16/2019   GLUCOSE 119 (H) 11/24/2019   CHOL 118 06/16/2019   TRIG 88.0 06/16/2019   HDL 52.40 06/16/2019   LDLDIRECT 149.6 03/26/2013   LDLCALC 48 06/16/2019   ALT 27 07/14/2019   AST 34 07/14/2019   NA 137 11/24/2019   K 4.1 11/24/2019   CL 105 11/24/2019   CREATININE 0.70 11/24/2019   BUN 10 11/24/2019   CO2 22  11/24/2019   TSH 0.86 06/16/2019   INR 1.01 04/10/2011   HGBA1C 6.5 06/16/2019   MICROALBUR <0.7 06/16/2019    No results found.   Assessment & Plan:  Plan  I am having Michele Tran maintain her blood glucose meter kit and supplies, glucose blood, furosemide, fluticasone, amLODipine, ammonium lactate, atorvastatin, carvedilol, freestyle, metFORMIN, multivitamin, and famotidine.  No orders of the defined types were placed in this encounter.   Problem List Items Addressed This Visit      Unprioritized   Essential hypertension  Well controlled, no changes to meds. Encouraged heart healthy diet such as the DASH diet and exercise as tolerated.        Relevant Orders   Lipid panel   CBC with Differential/Platelet   Comprehensive metabolic panel   Hyperlipidemia associated with type 2 diabetes mellitus ([place]) - Primary    Encouraged heart healthy diet, increase exercise, avoid trans fats, consider a krill oil cap daily      Relevant Orders   Lipid panel   CBC with Differential/Platelet   Comprehensive metabolic panel   Uncontrolled type 2 diabetes mellitus with hyperglycemia ([place])    hgba1c to be checked , minimize simple carbs. Increase exercise as tolerated. Continue current meds       Relevant Orders   CBC with Differential/Platelet   Hemoglobin A1c   Comprehensive metabolic panel    Other Visit Diagnoses    History of partial thyroidectomy       Relevant Orders   Thyroid Panel With TSH      Follow-up: Return in about 6 months (around 06/14/2020), or if symptoms worsen or fail to improve, for annual exam, fasting.  Ann Held, DO

## 2019-12-15 LAB — THYROID PANEL WITH TSH
Free Thyroxine Index: 1.7 (ref 1.4–3.8)
T3 Uptake: 28 % (ref 22–35)
T4, Total: 6 ug/dL (ref 5.1–11.9)
TSH: 3.65 mIU/L

## 2020-01-04 ENCOUNTER — Ambulatory Visit (INDEPENDENT_AMBULATORY_CARE_PROVIDER_SITE_OTHER): Payer: BC Managed Care – PPO | Admitting: Family Medicine

## 2020-01-04 ENCOUNTER — Encounter: Payer: Self-pay | Admitting: Family Medicine

## 2020-01-04 ENCOUNTER — Other Ambulatory Visit: Payer: Self-pay | Admitting: Family Medicine

## 2020-01-04 ENCOUNTER — Other Ambulatory Visit: Payer: Self-pay

## 2020-01-04 VITALS — BP 128/88 | HR 71 | Temp 98.7°F | Resp 18 | Ht 65.5 in | Wt 266.8 lb

## 2020-01-04 DIAGNOSIS — D1724 Benign lipomatous neoplasm of skin and subcutaneous tissue of left leg: Secondary | ICD-10-CM | POA: Diagnosis not present

## 2020-01-04 DIAGNOSIS — L918 Other hypertrophic disorders of the skin: Secondary | ICD-10-CM | POA: Diagnosis not present

## 2020-01-05 NOTE — Progress Notes (Signed)
Skin Tag Removal Procedure Note  Pre-operative Diagnosis: Classic skin tags (acrochordon)  Post-operative Diagnosis: Classic skin tags (acrochordon)  Locations:posterior leg--Left   Indications: irritation , getting caught in closes   Anesthesia: Lidocaine 2% without epinephrine without added sodium bicarbonate  Procedure Details  The risks (including bleeding and infection) and benefits of the procedure and Written informed consent obtained. Using sterile scalpel , 1 large  skin tag was cut off at their bases after cleansing with Betadine.  Bleeding was controlled by pressure.  And silver nitrate  Bandage placed  Findings: Skin tag---  Because the size increased so fast over a short period of time it was sent for pathology   Condition: Stable  Complications: none.  Plan: 1. Instructed to keep the wounds dry and covered for 24-48h and clean thereafter. 2. Warning signs of infection were reviewed.   3. Recommended that the patient use OTC acetaminophen as needed for pain.  4. Return as needed.

## 2020-01-05 NOTE — Patient Instructions (Signed)
Skin Tag, Adult  A skin tag (acrochordon) is a soft, extra growth of skin. Most skin tags are flesh-colored and rarely bigger than a pencil eraser. They commonly form near areas where there are folds in the skin, such as the armpit or groin. Skin tags are not dangerous, and they do not spread from person to person (are not contagious). You may have one skin tag or several. Skin tags do not require treatment. However, your health care provider may recommend removal of a skin tag if it:  Gets irritated from clothing.  Bleeds.  Is visible and unsightly. Your health care provider can remove skin tags with a simple surgical procedure or a procedure that involves freezing the skin tag. Follow these instructions at home:  Watch for any changes in your skin tag. A normal skin tag does not require any other special care at home.  Take over-the-counter and prescription medicines only as told by your health care provider.  Keep all follow-up visits as told by your health care provider. This is important. Contact a health care provider if:  You have a skin tag that: ? Becomes painful. ? Changes color. ? Bleeds. ? Swells.  You develop more skin tags. This information is not intended to replace advice given to you by your health care provider. Make sure you discuss any questions you have with your health care provider. Document Revised: 05/17/2017 Document Reviewed: 06/19/2015 Elsevier Patient Education  2020 Elsevier Inc.  

## 2020-01-14 ENCOUNTER — Encounter: Payer: Self-pay | Admitting: Family Medicine

## 2020-01-14 ENCOUNTER — Other Ambulatory Visit: Payer: Self-pay | Admitting: Family Medicine

## 2020-01-14 DIAGNOSIS — L089 Local infection of the skin and subcutaneous tissue, unspecified: Secondary | ICD-10-CM

## 2020-01-14 DIAGNOSIS — T148XXA Other injury of unspecified body region, initial encounter: Secondary | ICD-10-CM

## 2020-01-14 MED ORDER — CEPHALEXIN 500 MG PO CAPS: 500.0000 mg | ORAL_CAPSULE | Freq: Two times a day (BID) | ORAL | 0 refills | Status: DC

## 2020-01-14 NOTE — Telephone Encounter (Signed)
I have sent an antibiotic to the pharmacy Can she come in next week so we can see it?

## 2020-01-14 NOTE — Telephone Encounter (Signed)
Should Pt keep wound covered, any special instructions for that?

## 2020-01-21 ENCOUNTER — Other Ambulatory Visit: Payer: Self-pay

## 2020-01-21 ENCOUNTER — Ambulatory Visit (INDEPENDENT_AMBULATORY_CARE_PROVIDER_SITE_OTHER): Payer: BC Managed Care – PPO | Admitting: Family Medicine

## 2020-01-21 VITALS — BP 114/70 | HR 77 | Temp 97.9°F | Resp 18 | Ht 65.5 in | Wt 269.6 lb

## 2020-01-21 DIAGNOSIS — L089 Local infection of the skin and subcutaneous tissue, unspecified: Secondary | ICD-10-CM | POA: Diagnosis not present

## 2020-01-21 MED ORDER — CEPHALEXIN 500 MG PO CAPS: 500.0000 mg | ORAL_CAPSULE | Freq: Two times a day (BID) | ORAL | 0 refills | Status: DC

## 2020-01-21 NOTE — Patient Instructions (Signed)
Wound Infection °A wound infection happens when tiny organisms (microorganisms) start to grow in a wound. A wound infection is most often caused by bacteria. Infection can cause the wound to break open or worsen. Wound infection needs treatment. If a wound infection is left untreated, complications can occur. Untreated wound infections may lead to an infection in the bloodstream (septicemia) or a bone infection (osteomyelitis). °What are the causes? °This condition is most often caused by bacteria growing in a wound. Other microorganisms, like yeast and fungi, can also cause wound infections. °What increases the risk? °The following factors may make you more likely to develop this condition: °· Having a weak body defense system (immune system). °· Having diabetes. °· Taking steroid medicines for a long time (chronic use). °· Smoking. °· Being an older person. °· Being overweight. °· Taking chemotherapy medicines. °What are the signs or symptoms? °Symptoms of this condition include: °· Having more redness, swelling, or pain at the wound site. °· Having more blood or fluid at the wound site. °· A bad smell coming from a wound or bandage (dressing). °· Having a fever. °· Feeling tired or fatigued. °· Having warmth at or around the wound. °· Having pus at the wound site. °How is this diagnosed? °This condition is diagnosed with a medical history and physical exam. You may also have a wound culture or blood tests or both. °How is this treated? °This condition is usually treated with an antibiotic medicine. °· The infection should improve 24-48 hours after you start antibiotics. °· After 24-48 hours, redness around the wound should stop spreading, and the wound should be less painful. °Follow these instructions at home: °Medicines °· Take or apply over-the-counter and prescription medicines only as told by your health care provider. °· If you were prescribed an antibiotic medicine, take or apply it as told by your health  care provider. Do not stop using the antibiotic even if you start to feel better. °Wound care ° °· Clean the wound each day, or as told by your health care provider. °? Wash the wound with mild soap and water. °? Rinse the wound with water to remove all soap. °? Pat the wound dry with a clean towel. Do not rub it. °· Follow instructions from your health care provider about how to take care of your wound. Make sure you: °? Wash your hands with soap and water before and after you change your dressing. If soap and water are not available, use hand sanitizer. °? Change your dressing as told by your health care provider. °? Leave stitches (sutures), skin glue, or adhesive strips in place if your wound has been closed. These skin closures may need to stay in place for 2 weeks or longer. If adhesive strip edges start to loosen and curl up, you may trim the loose edges. Do not remove adhesive strips completely unless your health care provider tells you to do that. Some wounds are left open to heal on their own. °· Check your wound every day for signs of infection. Watch for: °? More redness, swelling, or pain. °? More fluid or blood. °? Warmth. °? Pus or a bad smell. °General instructions °· Keep the dressing dry until your health care provider says it can be removed. °· Do not take baths, swim, or use a hot tub until your health care provider approves. Ask your health care provider if you may take showers. You may only be allowed to take sponge baths. °· Raise (elevate)   the injured area above the level of your heart while you are sitting or lying down. °· Do not scratch or pick at the wound. °· Keep all follow-up visits as told by your health care provider. This is important. °Contact a health care provider if: °· Your pain is not controlled with medicine. °· You have more redness, swelling, or pain around your wound. °· You have more fluid or blood coming from your wound. °· Your wound feels warm to the touch. °· You have  pus coming from your wound. °· You continue to notice a bad smell coming from your wound or your dressing. °· Your wound that was closed breaks open. °Get help right away if: °· You have a red streak going away from your wound. °· You have a fever. °Summary °· A wound infection happens when tiny organisms (microorganisms) start to grow in a wound. °· This condition is usually treated with an antibiotic medicine. °· Follow instructions from your health care provider about how to take care of your wound. °· Contact a health care provider if your wound infection does not begin to improve in 24-48 hours, or your symptoms worsen. °· Keep all follow-up visits as told by your health care provider. This is important. °This information is not intended to replace advice given to you by your health care provider. Make sure you discuss any questions you have with your health care provider. °Document Revised: 01/14/2018 Document Reviewed: 01/14/2018 °Elsevier Patient Education © 2020 [employer] Inc. ° °

## 2020-01-24 ENCOUNTER — Encounter: Payer: Self-pay | Admitting: Family Medicine

## 2020-01-24 NOTE — Progress Notes (Signed)
Patient ID: Michele Tran, female    DOB: May 20, 1971  Age: 49 y.o. MRN: [id number]    Subjective:  Subjective  HPI Michele Tran presents for wound check.  Pt just finished abx for infection s/p skin tag removal.  It is improving but not completely   Review of Systems  Constitutional: Negative for appetite change, diaphoresis, fatigue and unexpected weight change.  Eyes: Negative for pain, redness and visual disturbance.  Respiratory: Negative for cough, chest tightness, shortness of breath and wheezing.   Cardiovascular: Negative for chest pain, palpitations and leg swelling.  Endocrine: Negative for cold intolerance, heat intolerance, polydipsia, polyphagia and polyuria.  Genitourinary: Negative for difficulty urinating, dysuria and frequency.  Neurological: Negative for dizziness, light-headedness, numbness and headaches.    History Past Medical History:  Diagnosis Date  . Abdominal bloating   . Anemia   . Asthma   . Diabetes mellitus without complication (Michele Tran)    type 2  . GERD (gastroesophageal reflux disease)   . Hirsutism   . History of COVID-19    06/2019  . Hyperlipidemia   . Hypertension   . Obesity, morbid (Michele Tran)   . Polycystic ovaries     She has a past surgical history that includes UTERINE polyps (05-31-10) and Thyroidectomy (Right, 11/27/2019).   Her family history includes Arthritis in an other family member; Breast cancer in her paternal aunt; Cancer in her paternal aunt; Cancer (age of onset: 79) in her father; Coronary artery disease in her paternal uncle; Diabetes in an other family member; Hyperlipidemia in an other family member; Hypertension in an other family member; Leukemia in her paternal uncle; Lung cancer in her paternal aunt; Stroke (age of onset: 59) in her mother.She reports that she has never smoked. She has never used smokeless tobacco. She reports that she does not drink alcohol and does not use drugs.  Current Outpatient Medications on File  Prior to Visit  Medication Sig Dispense Refill  . amLODipine (NORVASC) 5 MG tablet Take 1 tablet (5 mg total) by mouth daily. 90 tablet 3  . ammonium lactate (LAC-HYDRIN) 12 % cream Apply topically as needed for dry skin. 385 g 0  . atorvastatin (LIPITOR) 40 MG tablet Take 1 tablet (40 mg total) by mouth daily. 90 tablet 3  . blood glucose meter kit and supplies Dispense based on patient and insurance preference.  Use as directed once a day.  Dx code: E11.9 1 each 0  . carvedilol (COREG) 25 MG tablet TAKE 1 TABLET BY MOUTH  TWICE DAILY WITH MEALS 180 tablet 3  . famotidine (PEPCID) 20 MG tablet Take 1 tablet (20 mg total) by mouth 2 (two) times daily. 90 tablet 3  . fluticasone (FLONASE) 50 MCG/ACT nasal spray Place 2 sprays into both nostrils daily as needed for allergies or rhinitis. 48 g 0  . furosemide (LASIX) 20 MG tablet TAKE 1 TABLET BY MOUTH  DAILY 30 tablet 0  . glucose blood (FREESTYLE LITE) test strip Use as instructed once a day.  Dx code:  E11.9 100 each 1  . Lancets (FREESTYLE) lancets USE ONCE DAILY AS DIRECTED 100 each 1  . metFORMIN (GLUCOPHAGE-XR) 500 MG 24 hr tablet TAKE 2 TABLETS BY MOUTH  EVERY DAY 180 tablet 3  . Multiple Vitamin (MULTIVITAMIN) tablet Take 1 tablet by mouth daily.     No current facility-administered medications on file prior to visit.     Objective:  Objective  Physical Exam Vitals and nursing note reviewed.  Constitutional:      Appearance: She is well-developed.  Eyes:     Conjunctiva/sclera: Conjunctivae normal.  Neck:     Thyroid: No thyromegaly.     Vascular: No JVD.  Pulmonary:     Effort: No respiratory distress.     Breath sounds: No wheezing or rales.  Chest:     Chest wall: No tenderness.  Skin:      Neurological:     Mental Status: She is alert and oriented to person, place, and time.    BP 114/70 (BP Location: Right Arm, Patient Position: Sitting, Cuff Size: Large)   Pulse 77   Temp 97.9 F (36.6 C) (Oral)   Resp 18    Ht 5' 5.5" (1.664 m)   Wt 269 lb 9.6 oz (122.3 kg)   SpO2 99%   BMI 44.18 kg/m  Wt Readings from Last 3 Encounters:  01/21/20 269 lb 9.6 oz (122.3 kg)  01/04/20 266 lb 12.8 oz (121 kg)  12/14/19 267 lb 9.6 oz (121.4 kg)     Lab Results  Component Value Date   WBC 7.2 12/14/2019   HGB 10.8 (L) 12/14/2019   HCT 33.3 (L) 12/14/2019   PLT 335.0 12/14/2019   GLUCOSE 114 (H) 12/14/2019   CHOL 152 12/14/2019   TRIG 98.0 12/14/2019   HDL 57.60 12/14/2019   LDLDIRECT 149.6 03/26/2013   LDLCALC 75 12/14/2019   ALT 16 12/14/2019   AST 17 12/14/2019   NA 138 12/14/2019   K 4.1 12/14/2019   CL 101 12/14/2019   CREATININE 0.76 12/14/2019   BUN 11 12/14/2019   CO2 27 12/14/2019   TSH 3.65 12/14/2019   INR 1.01 04/10/2011   HGBA1C 6.8 (H) 12/14/2019   MICROALBUR <0.7 06/16/2019    No results found.   Assessment & Plan:  Plan  I have discontinued Amaiya R. Hangartner's cephALEXin. I am also having her start on cephALEXin. Additionally, I am having her maintain her blood glucose meter kit and supplies, glucose blood, furosemide, fluticasone, amLODipine, ammonium lactate, atorvastatin, carvedilol, freestyle, metFORMIN, multivitamin, and famotidine.  Meds ordered this encounter  Medications  . cephALEXin (KEFLEX) 500 MG capsule    Sig: Take 1 capsule (500 mg total) by mouth 2 (two) times daily.    Dispense:  14 capsule    Refill:  0    Problem List Items Addressed This Visit    None    Visit Diagnoses    Wound infection    -  Primary   Relevant Medications   cephALEXin (KEFLEX) 500 MG capsule      Wound healing well  refill abx--- rto prn   Follow-up: No follow-ups on file.  Michele Held, DO

## 2020-03-24 ENCOUNTER — Encounter: Payer: Self-pay | Admitting: Family Medicine

## 2020-05-10 ENCOUNTER — Other Ambulatory Visit: Payer: Self-pay | Admitting: Family Medicine

## 2020-05-10 DIAGNOSIS — R1013 Epigastric pain: Secondary | ICD-10-CM

## 2020-06-01 DIAGNOSIS — D440 Neoplasm of uncertain behavior of thyroid gland: Secondary | ICD-10-CM | POA: Diagnosis not present

## 2020-06-19 ENCOUNTER — Other Ambulatory Visit: Payer: Self-pay | Admitting: Family Medicine

## 2020-06-19 DIAGNOSIS — Z0000 Encounter for general adult medical examination without abnormal findings: Secondary | ICD-10-CM

## 2020-06-19 DIAGNOSIS — I10 Essential (primary) hypertension: Secondary | ICD-10-CM

## 2020-06-19 DIAGNOSIS — E1165 Type 2 diabetes mellitus with hyperglycemia: Secondary | ICD-10-CM

## 2020-06-20 ENCOUNTER — Encounter: Payer: BC Managed Care – PPO | Admitting: Family Medicine

## 2020-06-25 ENCOUNTER — Other Ambulatory Visit: Payer: Self-pay | Admitting: Family Medicine

## 2020-06-25 DIAGNOSIS — I10 Essential (primary) hypertension: Secondary | ICD-10-CM

## 2020-06-25 DIAGNOSIS — E1169 Type 2 diabetes mellitus with other specified complication: Secondary | ICD-10-CM

## 2020-06-27 ENCOUNTER — Other Ambulatory Visit (HOSPITAL_BASED_OUTPATIENT_CLINIC_OR_DEPARTMENT_OTHER): Payer: Self-pay | Admitting: Family Medicine

## 2020-06-27 ENCOUNTER — Other Ambulatory Visit: Payer: Self-pay

## 2020-06-27 ENCOUNTER — Other Ambulatory Visit: Payer: Self-pay | Admitting: Family Medicine

## 2020-06-27 ENCOUNTER — Encounter: Payer: Self-pay | Admitting: Family Medicine

## 2020-06-27 DIAGNOSIS — Z1231 Encounter for screening mammogram for malignant neoplasm of breast: Secondary | ICD-10-CM

## 2020-06-27 DIAGNOSIS — E1165 Type 2 diabetes mellitus with hyperglycemia: Secondary | ICD-10-CM

## 2020-06-27 DIAGNOSIS — I10 Essential (primary) hypertension: Secondary | ICD-10-CM

## 2020-06-27 MED ORDER — FUROSEMIDE 20 MG PO TABS: 20.0000 mg | ORAL_TABLET | Freq: Every day | ORAL | 0 refills | Status: DC

## 2020-07-07 ENCOUNTER — Ambulatory Visit (HOSPITAL_BASED_OUTPATIENT_CLINIC_OR_DEPARTMENT_OTHER): Payer: BC Managed Care – PPO

## 2020-07-08 ENCOUNTER — Ambulatory Visit (HOSPITAL_BASED_OUTPATIENT_CLINIC_OR_DEPARTMENT_OTHER): Payer: BC Managed Care – PPO

## 2020-07-12 ENCOUNTER — Other Ambulatory Visit: Payer: Self-pay

## 2020-07-12 ENCOUNTER — Ambulatory Visit (HOSPITAL_BASED_OUTPATIENT_CLINIC_OR_DEPARTMENT_OTHER)
Admission: RE | Admit: 2020-07-12 | Discharge: 2020-07-12 | Disposition: A | Discharge status: Home / Self Care | Payer: BC Managed Care – PPO | Source: Ambulatory Visit | Attending: Family Medicine | Admitting: Family Medicine

## 2020-07-12 DIAGNOSIS — Z1231 Encounter for screening mammogram for malignant neoplasm of breast: Secondary | ICD-10-CM | POA: Insufficient documentation

## 2020-07-29 ENCOUNTER — Telehealth: Payer: Self-pay | Admitting: Family Medicine

## 2020-07-29 NOTE — Telephone Encounter (Signed)
Can we insert labs for CPE on Monday? Patient would like to come in morning since it is fasting

## 2020-07-29 NOTE — Addendum Note (Signed)
Addended by: Roxanne Gates on: 07/29/2020 11:23 AM   Modules accepted: Orders

## 2020-07-29 NOTE — Telephone Encounter (Signed)
Labs ordered.

## 2020-08-01 ENCOUNTER — Ambulatory Visit (INDEPENDENT_AMBULATORY_CARE_PROVIDER_SITE_OTHER): Payer: BC Managed Care – PPO | Admitting: Family Medicine

## 2020-08-01 ENCOUNTER — Encounter: Payer: Self-pay | Admitting: Family Medicine

## 2020-08-01 ENCOUNTER — Other Ambulatory Visit: Payer: Self-pay

## 2020-08-01 ENCOUNTER — Other Ambulatory Visit (INDEPENDENT_AMBULATORY_CARE_PROVIDER_SITE_OTHER): Payer: BC Managed Care – PPO

## 2020-08-01 VITALS — BP 138/100 | HR 73 | Temp 98.3°F | Resp 18 | Ht 65.5 in | Wt 278.6 lb

## 2020-08-01 DIAGNOSIS — E1165 Type 2 diabetes mellitus with hyperglycemia: Secondary | ICD-10-CM | POA: Diagnosis not present

## 2020-08-01 DIAGNOSIS — Z0000 Encounter for general adult medical examination without abnormal findings: Secondary | ICD-10-CM

## 2020-08-01 DIAGNOSIS — I10 Essential (primary) hypertension: Secondary | ICD-10-CM | POA: Diagnosis not present

## 2020-08-01 DIAGNOSIS — Z23 Encounter for immunization: Secondary | ICD-10-CM | POA: Diagnosis not present

## 2020-08-01 DIAGNOSIS — Z1211 Encounter for screening for malignant neoplasm of colon: Secondary | ICD-10-CM

## 2020-08-01 LAB — CBC WITH DIFFERENTIAL/PLATELET
Basophils Absolute: 0 10*3/uL (ref 0.0–0.1)
Basophils Relative: 0.2 % (ref 0.0–3.0)
Eosinophils Absolute: 0.3 10*3/uL (ref 0.0–0.7)
Eosinophils Relative: 3.3 % (ref 0.0–5.0)
HCT: 34.7 % — ABNORMAL LOW (ref 36.0–46.0)
Hemoglobin: 11.3 g/dL — ABNORMAL LOW (ref 12.0–15.0)
Lymphocytes Relative: 20.9 % (ref 12.0–46.0)
Lymphs Abs: 1.8 10*3/uL (ref 0.7–4.0)
MCHC: 32.6 g/dL (ref 30.0–36.0)
MCV: 79.3 fl (ref 78.0–100.0)
Monocytes Absolute: 0.6 10*3/uL (ref 0.1–1.0)
Monocytes Relative: 6.8 % (ref 3.0–12.0)
Neutro Abs: 5.9 10*3/uL (ref 1.4–7.7)
Neutrophils Relative %: 68.8 % (ref 43.0–77.0)
Platelets: 288 10*3/uL (ref 150.0–400.0)
RBC: 4.38 Mil/uL (ref 3.87–5.11)
RDW: 15.7 % — ABNORMAL HIGH (ref 11.5–15.5)
WBC: 8.5 10*3/uL (ref 4.0–10.5)

## 2020-08-01 LAB — LIPID PANEL
Cholesterol: 115 mg/dL (ref 0–200)
HDL: 53.6 mg/dL (ref >39.00)
LDL Cholesterol: 41 mg/dL (ref 0–99)
NonHDL: 61.51
Total CHOL/HDL Ratio: 2
Triglycerides: 105 mg/dL (ref 0.0–149.0)
VLDL: 21 mg/dL (ref 0.0–40.0)

## 2020-08-01 LAB — MICROALBUMIN / CREATININE URINE RATIO
Creatinine,U: 148.5 mg/dL
Microalb Creat Ratio: 0.6 mg/g (ref 0.0–30.0)
Microalb, Ur: 0.9 mg/dL (ref 0.0–1.9)

## 2020-08-01 LAB — COMPREHENSIVE METABOLIC PANEL
ALT: 18 U/L (ref 0–35)
AST: 20 U/L (ref 0–37)
Albumin: 4.2 g/dL (ref 3.5–5.2)
Alkaline Phosphatase: 52 U/L (ref 39–117)
BUN: 11 mg/dL (ref 6–23)
CO2: 24 mEq/L (ref 19–32)
Calcium: 9 mg/dL (ref 8.4–10.5)
Chloride: 102 mEq/L (ref 96–112)
Creatinine, Ser: 0.73 mg/dL (ref 0.40–1.20)
GFR: 97.31 mL/min (ref >60.00)
Glucose, Bld: 133 mg/dL — ABNORMAL HIGH (ref 70–99)
Potassium: 3.8 mEq/L (ref 3.5–5.1)
Sodium: 137 mEq/L (ref 135–145)
Total Bilirubin: 1.1 mg/dL (ref 0.2–1.2)
Total Protein: 8 g/dL (ref 6.0–8.3)

## 2020-08-01 LAB — HEMOGLOBIN A1C: Hgb A1c MFr Bld: 6.9 % — ABNORMAL HIGH (ref 4.6–6.5)

## 2020-08-01 LAB — TSH: TSH: 1.67 u[IU]/mL (ref 0.35–4.50)

## 2020-08-01 MED ORDER — FUROSEMIDE 20 MG PO TABS: 20.0000 mg | ORAL_TABLET | Freq: Every day | ORAL | 3 refills | Status: DC

## 2020-08-01 NOTE — Progress Notes (Signed)
Subjective:      Michele Tran is a 50 y.o. female and is here for a comprehensive physical exam. The patient reports no complaints .  Social History   Socioeconomic History  . Marital status: Single    Spouse name: Not on file  . Number of children: Not on file  . Years of education: Not on file  . Highest education level: Not on file  Occupational History  . Occupation: [employer]    Employer: [employer]  Tobacco Use  . Smoking status: Never Smoker  . Smokeless tobacco: Never Used  Substance and Sexual Activity  . Alcohol use: No  . Drug use: No  . Sexual activity: Yes    Partners: Male  Other Topics Concern  . Not on file  Social History Narrative   Exercise-- walking    Social Determinants of Health   Financial Resource Strain: Not on file  Food Insecurity: Not on file  Transportation Needs: Not on file  Physical Activity: Not on file  Stress: Not on file  Social Connections: Not on file  Intimate Partner Violence: Not on file   Health Maintenance  Topic Date Due  . OPHTHALMOLOGY EXAM  Never done  . COLONOSCOPY (Pts 45-84yr Insurance coverage will need to be confirmed)  Never done  . PAP SMEAR-Modifier  02/09/2018  . COVID-19 Vaccine (3 - Moderna risk 4-dose series) 11/11/2019  . TETANUS/TDAP  04/04/2020  . INFLUENZA VACCINE  09/15/2020 (Originally 01/17/2020)  . FOOT EXAM  12/13/2020  . HEMOGLOBIN A1C  01/29/2021  . MAMMOGRAM  07/12/2021  . URINE MICROALBUMIN  08/01/2021  . PNEUMOCOCCAL POLYSACCHARIDE VACCINE AGE 83-64 HIGH RISK  Completed  . Hepatitis C Screening  Completed  . HIV Screening  Completed    The following portions of the patient's history were reviewed and updated as appropriate:  She  has a past medical history of Abdominal bloating, Anemia, Asthma, Diabetes mellitus without complication ([place], GERD (gastroesophageal reflux disease), Hirsutism, History of COVID-19, Hyperlipidemia, Hypertension, Obesity, morbid ([place], and  Polycystic ovaries. She does not have any pertinent problems on file. She  has a past surgical history that includes UTERINE polyps (05-31-10) and Thyroidectomy (Right, 11/27/2019). Her family history includes Arthritis in an other family member; Breast cancer in her paternal aunt; Cancer in her paternal aunt; Cancer (age of onset: 728 in her father; Coronary artery disease in her paternal uncle; Diabetes in an other family member; Hyperlipidemia in an other family member; Hypertension in an other family member; Leukemia in her paternal uncle; Lung cancer in her paternal aunt; Stroke (age of onset: 745 in her mother. She  reports that she has never smoked. She has never used smokeless tobacco. She reports that she does not drink alcohol and does not use drugs. She has a current medication list which includes the following prescription(s): amlodipine, ammonium lactate, atorvastatin, blood glucose meter kit and supplies, carvedilol, famotidine, fluticasone, glucose blood, freestyle, metformin, multivitamin, and furosemide. Current Outpatient Medications on File Prior to Visit  Medication Sig Dispense Refill  . amLODipine (NORVASC) 5 MG tablet TAKE 1 TABLET DAILY 90 tablet 3  . ammonium lactate (LAC-HYDRIN) 12 % cream Apply topically as needed for dry skin. 385 g 0  . atorvastatin (LIPITOR) 40 MG tablet TAKE 1 TABLET DAILY 90 tablet 3  . blood glucose meter kit and supplies Dispense based on patient and insurance preference.  Use as directed once a day.  Dx code: E11.9 1 each 0  . carvedilol (COREG)  25 MG tablet TAKE 1 TABLET BY MOUTH  TWICE DAILY WITH MEALS 180 tablet 3  . famotidine (PEPCID) 20 MG tablet Take 1 tablet (20 mg total) by mouth 2 (two) times daily. 180 tablet 3  . fluticasone (FLONASE) 50 MCG/ACT nasal spray Place 2 sprays into both nostrils daily as needed for allergies or rhinitis. 48 g 0  . glucose blood (FREESTYLE LITE) test strip Use as instructed once a day.  Dx code:  E11.9 100 each  1  . Lancets (FREESTYLE) lancets USE ONCE DAILY AS DIRECTED 100 each 1  . metFORMIN (GLUCOPHAGE-XR) 500 MG 24 hr tablet TAKE 2 TABLETS DAILY 180 tablet 3  . Multiple Vitamin (MULTIVITAMIN) tablet Take 1 tablet by mouth daily.     No current facility-administered medications on file prior to visit.   She is allergic to ace inhibitors, cherry, codeine, and fluviral [influenza virus vaccine]..  Review of Systems Review of Systems  Constitutional: Negative for activity change, appetite change and fatigue.  HENT: Negative for hearing loss, congestion, tinnitus and ear discharge.  dentist q22mEyes: Negative for visual disturbance (see opth -- vision corrected to 20/20 with glasses- but overdue by 2 years ).  Respiratory: Negative for cough, chest tightness and shortness of breath.   Cardiovascular: Negative for chest pain, palpitations and leg swelling.  Gastrointestinal: Negative for abdominal pain, diarrhea, constipation and abdominal distention.  Genitourinary: Negative for urgency, frequency, decreased urine volume and difficulty urinating.  Musculoskeletal: Negative for back pain, arthralgias and gait problem.  Skin: Negative for color change, pallor and rash.  Neurological: Negative for dizziness, light-headedness, numbness and headaches.  Hematological: Negative for adenopathy. Does not bruise/bleed easily.  Psychiatric/Behavioral: Negative for suicidal ideas, confusion, sleep disturbance, self-injury, dysphoric mood, decreased concentration and agitation.       Objective:    BP (!) 138/100 (BP Location: Right Arm, Patient Position: Sitting, Cuff Size: Large)   Pulse 73   Temp 98.3 F (36.8 C) (Oral)   Resp 18   Ht 5' 5.5" (1.664 m)   Wt 278 lb 9.6 oz (126.4 kg)   LMP 07/24/2020   SpO2 99%   BMI 45.66 kg/m  General appearance: alert, cooperative, appears stated age and no distress Head: Normocephalic, without obvious abnormality, atraumatic Eyes: negative findings: lids  and lashes normal, conjunctivae and sclerae normal and pupils equal, round, reactive to light and accomodation Ears: normal TM's and external ear canals both ears Neck: no adenopathy, no carotid bruit, no JVD, supple, symmetrical, trachea midline and thyroid not enlarged, symmetric, no tenderness/mass/nodules Back: symmetric, no curvature. ROM normal. No CVA tenderness. Lungs: clear to auscultation bilaterally Breasts: gyn Heart: regular rate and rhythm, S1, S2 normal, no murmur, click, rub or gallop Abdomen: soft, non-tender; bowel sounds normal; no masses,  no organomegaly Pelvic: gyn Extremities: extremities normal, atraumatic, no cyanosis or edema Pulses: 2+ and symmetric Skin: Skin color, texture, turgor normal. No rashes or lesions Lymph nodes: Cervical, supraclavicular, and axillary nodes normal. Neurologic: Alert and oriented X 3, normal strength and tone. Normal symmetric reflexes. Normal coordination and gait    Assessment:    Healthy female exam.      Plan:    ghm utd Check labs See avs See After Visit Summary for Counseling Recommendations    .1. Essential hypertension Poorly controlled will alter medications, encouraged DASH diet, minimize caffeine and obtain adequate sleep. Report concerning symptoms and follow up as directed and as needed - furosemide (LASIX) 20 MG tablet; Take 1 tablet (20  mg total) by mouth daily.  Dispense: 90 tablet; Refill: 3  2. Preventative health care See above   3. Primary hypertension   4. Colon cancer screening   - Ambulatory referral to Gastroenterology  5. Need for Tdap vaccination   - Tdap vaccine greater than or equal to 7yo IM

## 2020-08-01 NOTE — Assessment & Plan Note (Signed)
Poorly controlled will alter medications, encouraged DASH diet, minimize caffeine and obtain adequate sleep. Report concerning symptoms and follow up as directed and as needed Pt states it is from the stress she is under ----- re check in next few week

## 2020-08-01 NOTE — Patient Instructions (Signed)
Preventive Care 49-49 Years Old, Female Preventive care refers to lifestyle choices and visits with your health care provider that can promote health and wellness. This includes:  A yearly physical exam. This is also called an annual wellness visit.  Regular dental and eye exams.  Immunizations.  Screening for certain conditions.  Healthy lifestyle choices, such as: ? Eating a healthy diet. ? Getting regular exercise. ? Not using drugs or products that contain nicotine and tobacco. ? Limiting alcohol use. What can I expect for my preventive care visit? Physical exam Your health care provider will check your:  Height and weight. These may be used to calculate your BMI (body mass index). BMI is a measurement that tells if you are at a healthy weight.  Heart rate and blood pressure.  Body temperature.  Skin for abnormal spots. Counseling Your health care provider may ask you questions about your:  Past medical problems.  Family's medical history.  Alcohol, tobacco, and drug use.  Emotional well-being.  Home life and relationship well-being.  Sexual activity.  Diet, exercise, and sleep habits.  Work and work Statistician.  Access to firearms.  Method of birth control.  Menstrual cycle.  Pregnancy history. What immunizations do I need? Vaccines are usually given at various ages, according to a schedule. Your health care provider will recommend vaccines for you based on your age, medical history, and lifestyle or other factors, such as travel or where you work.   What tests do I need? Blood tests  Lipid and cholesterol levels. These may be checked every 5 years, or more often if you are over 49 years old.  Hepatitis C test.  Hepatitis B test. Screening  Lung cancer screening. You may have this screening every year starting at age 49 if you have a 30-pack-year history of smoking and currently smoke or have quit within the past 15 years.  Colorectal cancer  screening. ? All adults should have this screening starting at age 49 and continuing until age 60. ? Your health care provider may recommend screening at age 49 if you are at increased risk. ? You will have tests every 1-10 years, depending on your results and the type of screening test.  Diabetes screening. ? This is done by checking your blood sugar (glucose) after you have not eaten for a while (fasting). ? You may have this done every 1-3 years.  Mammogram. ? This may be done every 1-2 years. ? Talk with your health care provider about when you should start having regular mammograms. This may depend on whether you have a family history of breast cancer.  BRCA-related cancer screening. This may be done if you have a family history of breast, ovarian, tubal, or peritoneal cancers.  Pelvic exam and Pap test. ? This may be done every 3 years starting at age 49. ? Starting at age 49, this may be done every 5 years if you have a Pap test in combination with an HPV test. Other tests  STD (sexually transmitted disease) testing, if you are at risk.  Bone density scan. This is done to screen for osteoporosis. You may have this scan if you are at high risk for osteoporosis. Talk with your health care provider about your test results, treatment options, and if necessary, the need for more tests. Follow these instructions at home: Eating and drinking  Eat a diet that includes fresh fruits and vegetables, whole grains, lean protein, and low-fat dairy products.  Take vitamin and mineral supplements  as recommended by your health care provider.  Do not drink alcohol if: ? Your health care provider tells you not to drink. ? You are pregnant, may be pregnant, or are planning to become pregnant.  If you drink alcohol: ? Limit how much you have to 0-1 drink a day. ? Be aware of how much alcohol is in your drink. In the [place] one drink equals one 12 oz bottle of beer (355 mL), one 5 oz glass of  wine (148 mL), or one 1 oz glass of hard liquor (44 mL).   Lifestyle  Take daily care of your teeth and gums. Brush your teeth every morning and night with fluoride toothpaste. Floss one time each day.  Stay active. Exercise for at least 30 minutes 5 or more days each week.  Do not use any products that contain nicotine or tobacco, such as cigarettes, e-cigarettes, and chewing tobacco. If you need help quitting, ask your health care provider.  Do not use drugs.  If you are sexually active, practice safe sex. Use a condom or other form of protection to prevent STIs (sexually transmitted infections).  If you do not wish to become pregnant, use a form of birth control. If you plan to become pregnant, see your health care provider for a prepregnancy visit.  If told by your health care provider, take low-dose aspirin daily starting at age 49.  Find healthy ways to cope with stress, such as: ? Meditation, yoga, or listening to music. ? Journaling. ? Talking to a trusted person. ? Spending time with friends and family. Safety  Always wear your seat belt while driving or riding in a vehicle.  Do not drive: ? If you have been drinking alcohol. Do not ride with someone who has been drinking. ? When you are tired or distracted. ? While texting.  Wear a helmet and other protective equipment during sports activities.  If you have firearms in your house, make sure you follow all gun safety procedures. What's next?  Visit your health care provider once a year for an annual wellness visit.  Ask your health care provider how often you should have your eyes and teeth checked.  Stay up to date on all vaccines. This information is not intended to replace advice given to you by your health care provider. Make sure you discuss any questions you have with your health care provider. Document Revised: 03/08/2020 Document Reviewed: 02/13/2018 Elsevier Patient Education  2021 [employer].

## 2020-08-30 ENCOUNTER — Ambulatory Visit: Payer: BC Managed Care – PPO

## 2020-09-09 ENCOUNTER — Encounter: Payer: Self-pay | Admitting: Family Medicine

## 2020-09-09 ENCOUNTER — Telehealth: Payer: Self-pay

## 2020-09-09 NOTE — Telephone Encounter (Signed)
We received several Rx request through [facility] and pt has [facility] on file. Pt called and left VM. Advised pt to call back to confirm new pharmacy.

## 2020-09-12 ENCOUNTER — Other Ambulatory Visit: Payer: Self-pay

## 2020-09-12 DIAGNOSIS — E1165 Type 2 diabetes mellitus with hyperglycemia: Secondary | ICD-10-CM

## 2020-09-12 DIAGNOSIS — E785 Hyperlipidemia, unspecified: Secondary | ICD-10-CM

## 2020-09-12 DIAGNOSIS — E1169 Type 2 diabetes mellitus with other specified complication: Secondary | ICD-10-CM

## 2020-09-12 DIAGNOSIS — I10 Essential (primary) hypertension: Secondary | ICD-10-CM

## 2020-09-12 MED ORDER — ATORVASTATIN CALCIUM 40 MG PO TABS: 40.0000 mg | ORAL_TABLET | Freq: Every day | ORAL | 3 refills | Status: DC

## 2020-09-12 MED ORDER — FUROSEMIDE 20 MG PO TABS: 20.0000 mg | ORAL_TABLET | Freq: Every day | ORAL | 3 refills | Status: DC

## 2020-09-12 MED ORDER — AMLODIPINE BESYLATE 5 MG PO TABS: 5.0000 mg | ORAL_TABLET | Freq: Every day | ORAL | 3 refills | Status: DC

## 2020-09-12 MED ORDER — METFORMIN HCL ER 500 MG PO TB24: ORAL_TABLET | ORAL | 3 refills | Status: DC

## 2020-10-05 ENCOUNTER — Encounter: Payer: Self-pay | Admitting: Family Medicine

## 2020-12-11 IMAGING — MG DIGITAL SCREENING BILAT W/ TOMO W/ CAD
8 series · 8 of 24 positions shown · non-contrast
Comparison: Previous exam(s).

CLINICAL DATA: Screening.

EXAM:
DIGITAL SCREENING BILATERAL MAMMOGRAM WITH TOMO AND CAD

[L CC synth-2D]
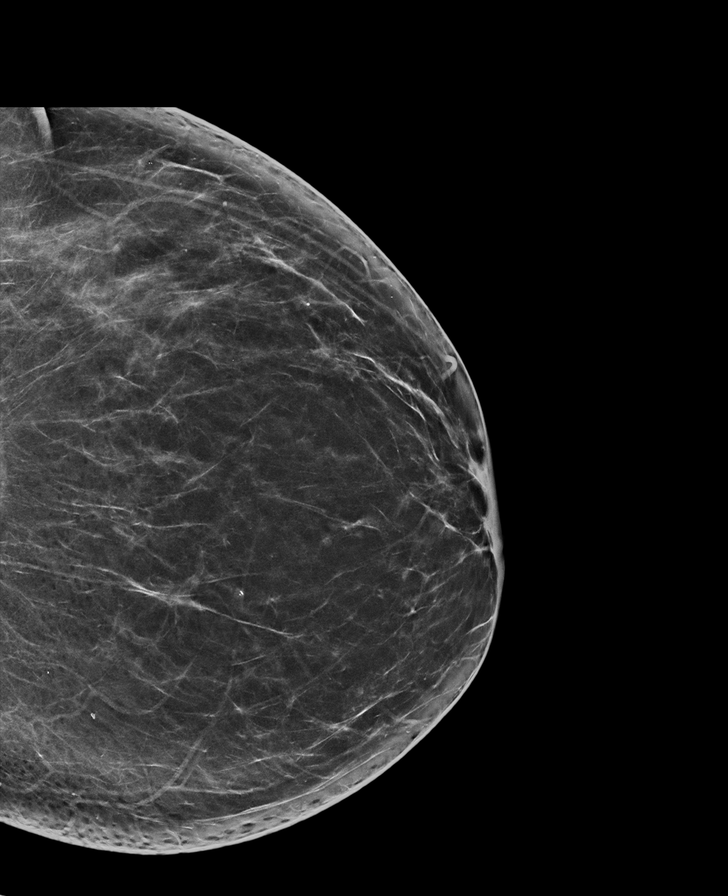

[L MLO synth-2D]
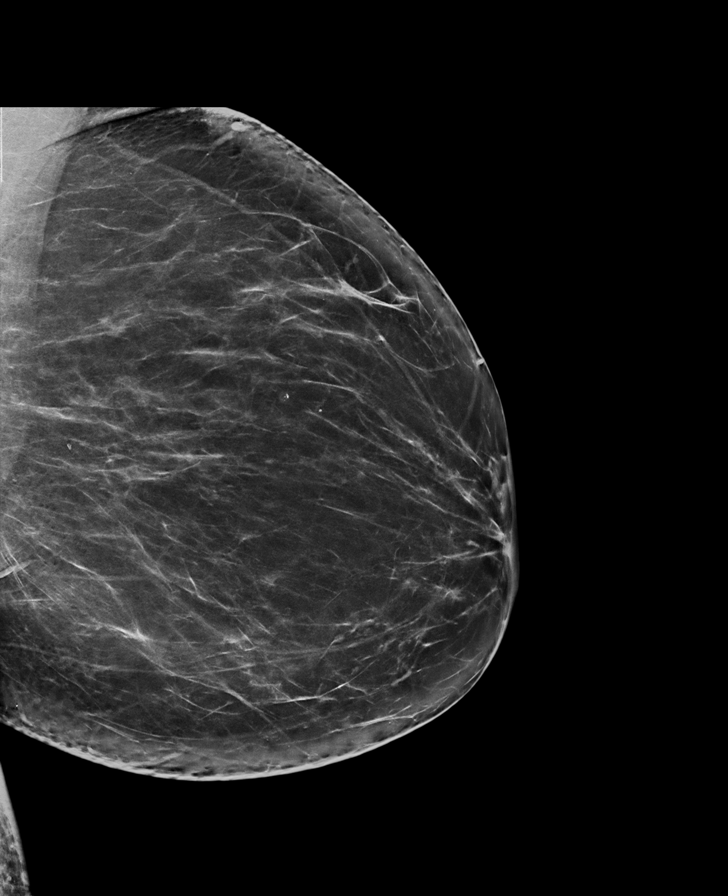

[R MLO synth-2D]
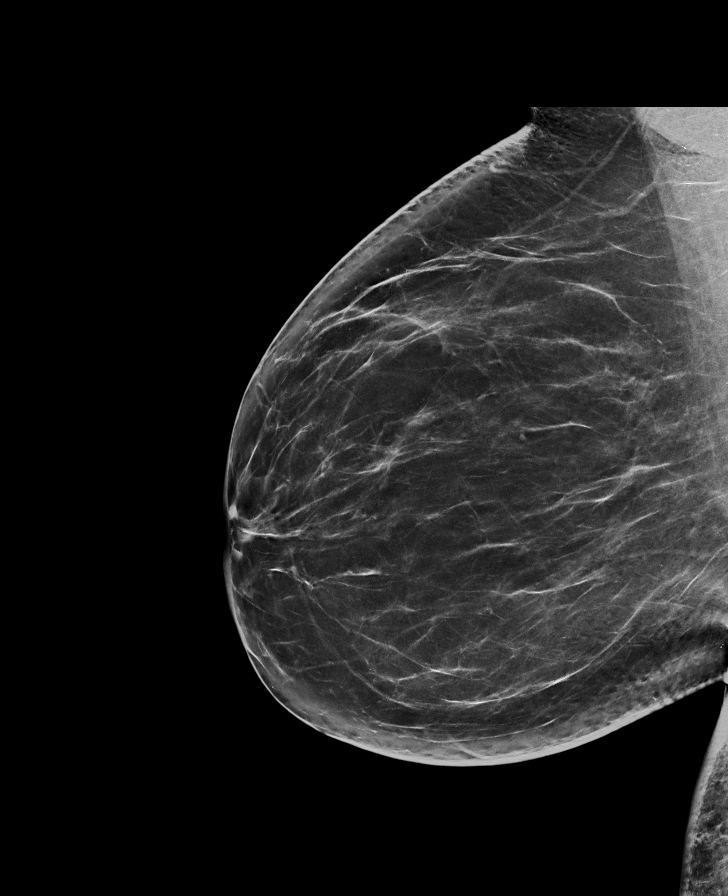

[R CC synth-2D]
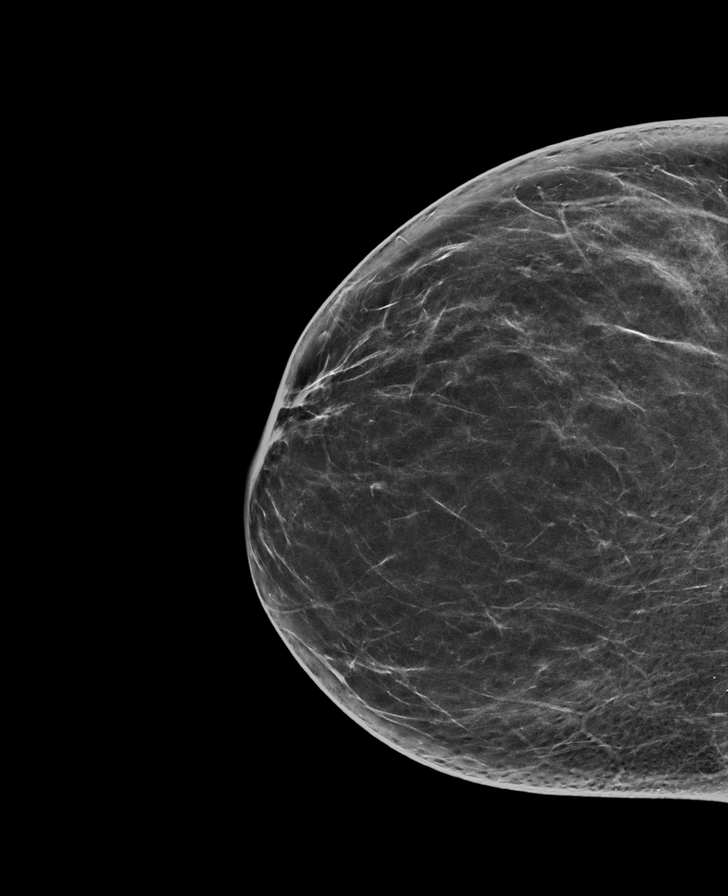

[L CC tomo · tomo slice 40/79.0]
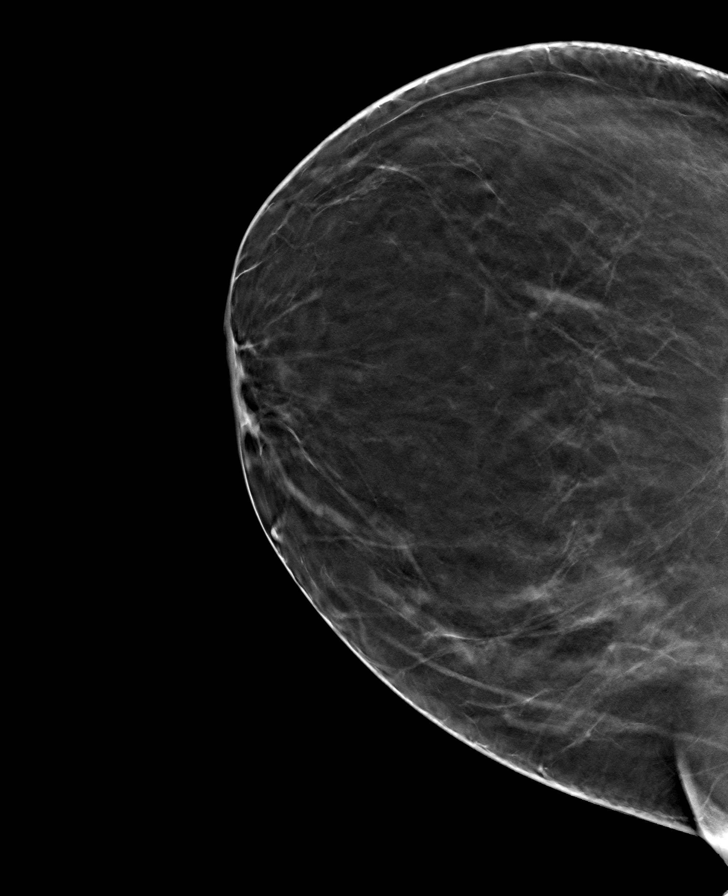

[R MLO tomo · tomo slice 43/86.0]
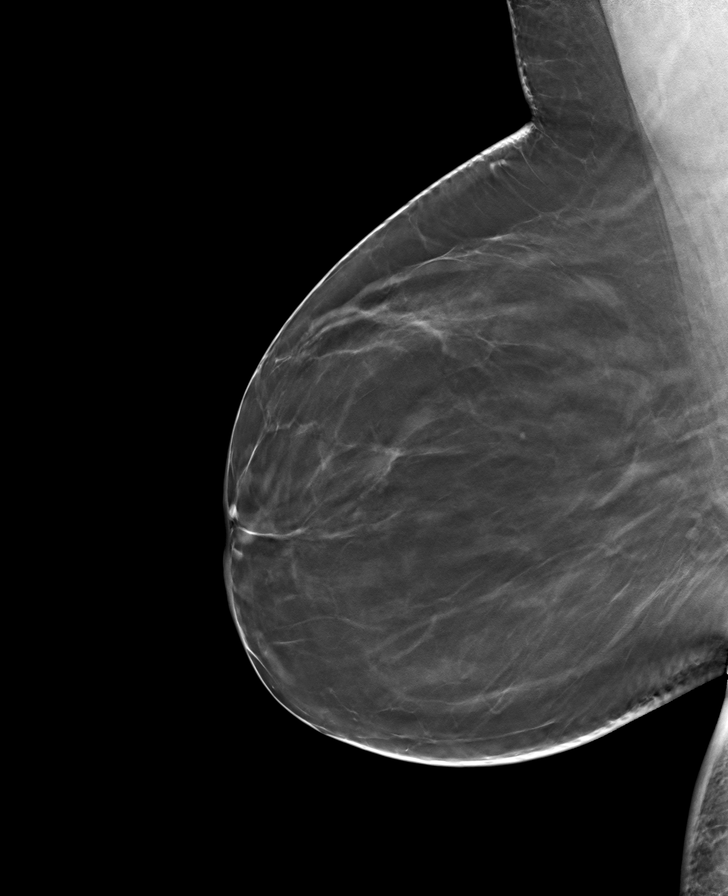

[L MLO tomo · tomo slice 45/90.0]
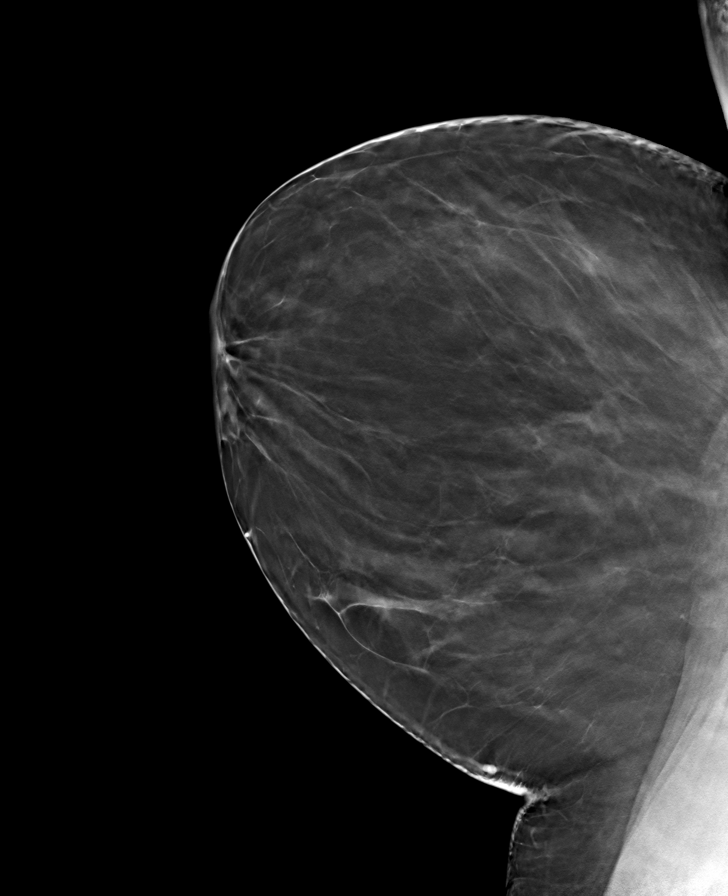

[R CC tomo · tomo slice 37/72.0]
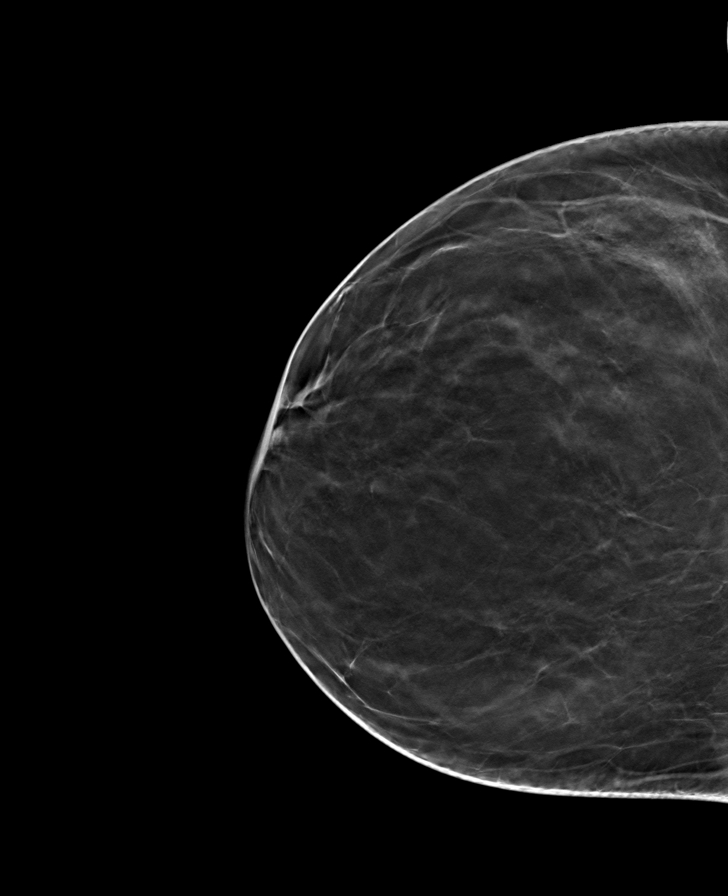

[8 of 24 positions shown; findings below may reference images not displayed]

ACR Breast Density Category b: There are scattered areas of
fibroglandular density.
FINDINGS: There are no findings suspicious for malignancy. Images were
processed with CAD.
IMPRESSION: No mammographic evidence of malignancy. A result letter of this
screening mammogram will be mailed directly to the patient.

RECOMMENDATION:
Screening mammogram in one year. (Code:[id number])

BI-RADS CATEGORY  1: Negative.

## 2020-12-13 ENCOUNTER — Other Ambulatory Visit: Payer: Self-pay

## 2020-12-13 DIAGNOSIS — R1013 Epigastric pain: Secondary | ICD-10-CM

## 2020-12-13 DIAGNOSIS — I10 Essential (primary) hypertension: Secondary | ICD-10-CM

## 2020-12-13 MED ORDER — FAMOTIDINE 20 MG PO TABS: 20.0000 mg | ORAL_TABLET | Freq: Two times a day (BID) | ORAL | 3 refills | Status: DC

## 2020-12-13 MED ORDER — CARVEDILOL 25 MG PO TABS: ORAL_TABLET | ORAL | 3 refills | Status: DC

## 2020-12-15 ENCOUNTER — Encounter: Payer: Self-pay | Admitting: Family Medicine

## 2020-12-15 ENCOUNTER — Telehealth: Payer: Self-pay

## 2020-12-15 NOTE — Telephone Encounter (Signed)
Pharmacy would like the okay to fill this Rx since the pt has a hx of Asthma and this is a Beta Blocker.  CB number: [phone] Refferance number: [id number]

## 2020-12-16 NOTE — Telephone Encounter (Signed)
Pharmacy made aware

## 2021-04-04 ENCOUNTER — Other Ambulatory Visit: Payer: Self-pay | Admitting: Family Medicine

## 2021-04-04 ENCOUNTER — Ambulatory Visit (INDEPENDENT_AMBULATORY_CARE_PROVIDER_SITE_OTHER): Payer: PRIVATE HEALTH INSURANCE | Admitting: Family Medicine

## 2021-04-04 ENCOUNTER — Other Ambulatory Visit: Payer: Self-pay

## 2021-04-04 ENCOUNTER — Encounter: Payer: Self-pay | Admitting: Family Medicine

## 2021-04-04 ENCOUNTER — Other Ambulatory Visit (INDEPENDENT_AMBULATORY_CARE_PROVIDER_SITE_OTHER): Payer: PRIVATE HEALTH INSURANCE

## 2021-04-04 VITALS — BP 126/84 | HR 91 | Temp 98.2°F | Resp 18 | Ht 65.5 in | Wt 280.6 lb

## 2021-04-04 DIAGNOSIS — E1165 Type 2 diabetes mellitus with hyperglycemia: Secondary | ICD-10-CM

## 2021-04-04 DIAGNOSIS — E1169 Type 2 diabetes mellitus with other specified complication: Secondary | ICD-10-CM

## 2021-04-04 DIAGNOSIS — I10 Essential (primary) hypertension: Secondary | ICD-10-CM | POA: Diagnosis not present

## 2021-04-04 DIAGNOSIS — R1013 Epigastric pain: Secondary | ICD-10-CM | POA: Diagnosis not present

## 2021-04-04 DIAGNOSIS — Z1211 Encounter for screening for malignant neoplasm of colon: Secondary | ICD-10-CM

## 2021-04-04 DIAGNOSIS — L853 Xerosis cutis: Secondary | ICD-10-CM

## 2021-04-04 DIAGNOSIS — E785 Hyperlipidemia, unspecified: Secondary | ICD-10-CM | POA: Diagnosis not present

## 2021-04-04 DIAGNOSIS — J0110 Acute frontal sinusitis, unspecified: Secondary | ICD-10-CM

## 2021-04-04 LAB — COMPREHENSIVE METABOLIC PANEL
ALT: 18 U/L (ref 0–35)
AST: 19 U/L (ref 0–37)
Albumin: 4.6 g/dL (ref 3.5–5.2)
Alkaline Phosphatase: 62 U/L (ref 39–117)
BUN: 10 mg/dL (ref 6–23)
CO2: 26 mEq/L (ref 19–32)
Calcium: 9.5 mg/dL (ref 8.4–10.5)
Chloride: 100 mEq/L (ref 96–112)
Creatinine, Ser: 0.8 mg/dL (ref 0.40–1.20)
GFR: 86.77 mL/min (ref >60.00)
Glucose, Bld: 124 mg/dL — ABNORMAL HIGH (ref 70–99)
Potassium: 3.9 mEq/L (ref 3.5–5.1)
Sodium: 136 mEq/L (ref 135–145)
Total Bilirubin: 1.5 mg/dL — ABNORMAL HIGH (ref 0.2–1.2)
Total Protein: 8.5 g/dL — ABNORMAL HIGH (ref 6.0–8.3)

## 2021-04-04 LAB — MICROALBUMIN / CREATININE URINE RATIO
Creatinine,U: 217.3 mg/dL
Microalb Creat Ratio: 0.9 mg/g (ref 0.0–30.0)
Microalb, Ur: 1.9 mg/dL (ref 0.0–1.9)

## 2021-04-04 LAB — HEMOGLOBIN A1C: Hgb A1c MFr Bld: 7 % — ABNORMAL HIGH (ref 4.6–6.5)

## 2021-04-04 LAB — LIPID PANEL
Cholesterol: 133 mg/dL (ref 0–200)
HDL: 58.8 mg/dL (ref >39.00)
LDL Cholesterol: 53 mg/dL (ref 0–99)
NonHDL: 74.43
Total CHOL/HDL Ratio: 2
Triglycerides: 108 mg/dL (ref 0.0–149.0)
VLDL: 21.6 mg/dL (ref 0.0–40.0)

## 2021-04-04 MED ORDER — METFORMIN HCL ER 500 MG PO TB24: ORAL_TABLET | ORAL | 3 refills | Status: DC

## 2021-04-04 MED ORDER — CARVEDILOL 25 MG PO TABS: ORAL_TABLET | ORAL | 3 refills | Status: DC

## 2021-04-04 MED ORDER — FAMOTIDINE 20 MG PO TABS: 20.0000 mg | ORAL_TABLET | Freq: Two times a day (BID) | ORAL | 3 refills | Status: DC

## 2021-04-04 MED ORDER — AMMONIUM LACTATE 12 % EX CREA
TOPICAL_CREAM | CUTANEOUS | 0 refills | Status: AC | PRN
Start: 2021-04-04 — End: ?

## 2021-04-04 MED ORDER — FUROSEMIDE 20 MG PO TABS: 20.0000 mg | ORAL_TABLET | Freq: Every day | ORAL | 3 refills | Status: DC

## 2021-04-04 MED ORDER — FLUTICASONE PROPIONATE 50 MCG/ACT NA SUSP: 2.0000 | Freq: Every day | NASAL | 0 refills | Status: DC | PRN

## 2021-04-04 MED ORDER — FREESTYLE LANCETS MISC: 1 refills | Status: DC

## 2021-04-04 MED ORDER — FREESTYLE LITE TEST VI STRP: ORAL_STRIP | 1 refills | Status: DC

## 2021-04-04 MED ORDER — AMLODIPINE BESYLATE 5 MG PO TABS: 5.0000 mg | ORAL_TABLET | Freq: Every day | ORAL | 3 refills | Status: DC

## 2021-04-04 MED ORDER — ATORVASTATIN CALCIUM 40 MG PO TABS: 40.0000 mg | ORAL_TABLET | Freq: Every day | ORAL | 3 refills | Status: DC

## 2021-04-04 NOTE — Patient Instructions (Signed)
Carbohydrate Counting for Diabetes Mellitus, Adult Carbohydrate counting is a method of keeping track of how many carbohydrates you eat. Eating carbohydrates naturally increases the amount of sugar (glucose) in the blood. Counting how many carbohydrates you eat improves your blood glucose control, which helps you manage your diabetes. It is important to know how many carbohydrates you can safely have in each meal. This is different for every person. A dietitian can help you make a meal plan and calculate how many carbohydrates you should have at each meal and snack. What foods contain carbohydrates? Carbohydrates are found in the following foods: Grains, such as breads and cereals. Dried beans and soy products. Starchy vegetables, such as potatoes, peas, and corn. Fruit and fruit juices. Milk and yogurt. Sweets and snack foods, such as cake, cookies, candy, chips, and soft drinks. How do I count carbohydrates in foods? There are two ways to count carbohydrates in food. You can read food labels or learn standard serving sizes of foods. You can use either of the methods or a combination of both. Using the Nutrition Facts label The Nutrition Facts list is included on the labels of almost all packaged foods and beverages in the [place] It includes: The serving size. Information about nutrients in each serving, including the grams (g) of carbohydrate per serving. To use the Nutrition Facts: Decide how many servings you will have. Multiply the number of servings by the number of carbohydrates per serving. The resulting number is the total amount of carbohydrates that you will be having. Learning the standard serving sizes of foods When you eat carbohydrate foods that are not packaged or do not include Nutrition Facts on the label, you need to measure the servings in order to count the amount of carbohydrates. Measure the foods that you will eat with a food scale or measuring cup, if needed. Decide how  many standard-size servings you will eat. Multiply the number of servings by 15. For foods that contain carbohydrates, one serving equals 15 g of carbohydrates. For example, if you eat 2 cups or 10 oz (300 g) of strawberries, you will have eaten 2 servings and 30 g of carbohydrates (2 servings x 15 g = 30 g). For foods that have more than one food mixed, such as soups and casseroles, you must count the carbohydrates in each food that is included. The following list contains standard serving sizes of common carbohydrate-rich foods. Each of these servings has about 15 g of carbohydrates: 1 slice of bread. 1 six-inch (15 cm) tortilla. ? cup or 2 oz (53 g) cooked rice or pasta.  cup or 3 oz (85 g) cooked or canned, drained and rinsed beans or lentils.  cup or 3 oz (85 g) starchy vegetable, such as peas, corn, or squash.  cup or 4 oz (120 g) hot cereal.  cup or 3 oz (85 g) boiled or mashed potatoes, or  or 3 oz (85 g) of a large baked potato.  cup or 4 fl oz (118 mL) fruit juice. 1 cup or 8 fl oz (237 mL) milk. 1 small or 4 oz (106 g) apple.  or 2 oz (63 g) of a medium banana. 1 cup or 5 oz (150 g) strawberries. 3 cups or 1 oz (24 g) popped popcorn. What is an example of carbohydrate counting? To calculate the number of carbohydrates in this sample meal, follow the steps shown below. Sample meal 3 oz (85 g) chicken breast. ? cup or 4 oz (106 g) brown   rice.  cup or 3 oz (85 g) corn. 1 cup or 8 fl oz (237 mL) milk. 1 cup or 5 oz (150 g) strawberries with sugar-free whipped topping. Carbohydrate calculation Identify the foods that contain carbohydrates: Rice. Corn. Milk. Strawberries. Calculate how many servings you have of each food: 2 servings rice. 1 serving corn. 1 serving milk. 1 serving strawberries. Multiply each number of servings by 15 g: 2 servings rice x 15 g = 30 g. 1 serving corn x 15 g = 15 g. 1 serving milk x 15 g = 15 g. 1 serving strawberries x 15 g = 15  g. Add together all of the amounts to find the total grams of carbohydrates eaten: 30 g + 15 g + 15 g + 15 g = 75 g of carbohydrates total. What are tips for following this plan? Shopping Develop a meal plan and then make a shopping list. Buy fresh and frozen vegetables, fresh and frozen fruit, dairy, eggs, beans, lentils, and whole grains. Look at food labels. Choose foods that have more fiber and less sugar. Avoid processed foods and foods with added sugars. Meal planning Aim to have the same amount of carbohydrates at each meal and for each snack time. Plan to have regular, balanced meals and snacks. Where to find more information American Diabetes Association: www.diabetes.org Centers for Disease Control and Prevention: www.cdc.gov Summary Carbohydrate counting is a method of keeping track of how many carbohydrates you eat. Eating carbohydrates naturally increases the amount of sugar (glucose) in the blood. Counting how many carbohydrates you eat improves your blood glucose control, which helps you manage your diabetes. A dietitian can help you make a meal plan and calculate how many carbohydrates you should have at each meal and snack. This information is not intended to replace advice given to you by your health care provider. Make sure you discuss any questions you have with your health care provider. Document Revised: 06/04/2019 Document Reviewed: 06/05/2019 Elsevier Patient Education  2021 Elsevier Inc.  

## 2021-04-04 NOTE — Progress Notes (Addendum)
Subjective:   By signing my name below, I, Michele Tran, attest that this documentation has been prepared under the direction and in the presence of Dr. [name], DO. 04/04/2021     Patient ID: Michele Tran, female    DOB: Aug 11, 1970, 50 y.o.   MRN: [id number]  Chief Complaint  Patient presents with  . Hypertension  . Hyperlipidemia  . Follow-up    Hypertension Pertinent negatives include no blurred vision, chest pain, headaches, malaise/fatigue, palpitations or shortness of breath.  Hyperlipidemia Pertinent negatives include no chest pain or shortness of breath.  Patient is in today for a office visit. [name]   She is requesting a refill on 20 mg pepcid daily PO, 25 mg carvedilol daily PO, 40 mg atorvastatin, 20 mg lasix daily PO, 500 mg metformin 2x daily PO, 5 mg amlodipine daily PO, 12% ammonium lactate cream, lancets, Flonase nasal spray, Glucose blood test strip.  She reports her blood sugar typically measures at 80-100 while at home. She reports her blood sugar measured 124 yesterday. She continues taking 500 mg metformin 2x daily PO and reports no new issues while taking it.   Lab Results  Component Value Date   HGBA1C 7.0 (H) 04/04/2021   She is due for a colonoscopy and is interested in setting up an appointment.  She is not interested in getting a flu vaccine during this visit.    Past Medical History:  Diagnosis Date  . Abdominal bloating   . Anemia   . Asthma   . Diabetes mellitus without complication ([place])    type 2  . GERD (gastroesophageal reflux disease)   . Hirsutism   . History of COVID-19    06/2019  . Hyperlipidemia   . Hypertension   . Obesity, morbid ([place])   . Polycystic ovaries     Past Surgical History:  Procedure Laterality Date  . THYROIDECTOMY Right 11/27/2019   Procedure: RIGHT HEMI THYROIDECTOMY;  Surgeon: Leta Baptist, MD;  Location: [facility];  Service: ENT;   Laterality: Right;  . UTERINE polyps  05-31-10   [facility]  [facility]    Family History  Problem Relation Age of Onset  . Stroke Mother 65       1st degree relative   . Coronary artery disease Paternal Uncle        late 79s  . Leukemia Paternal Uncle   . Cancer Father 46       advanced head , neck and throat ca  . Arthritis Other   . Diabetes Other        1st degree relative  . Hyperlipidemia Other   . Hypertension Other   . Lung cancer Paternal Aunt   . Cancer Paternal Aunt        breast, lung  . Breast cancer Paternal Aunt     Social History   Socioeconomic History  . Marital status: Single    Spouse name: Not on file  . Number of children: Not on file  . Years of education: Not on file  . Highest education level: Not on file  Occupational History  . Occupation: levalor  Tobacco Use  . Smoking status: Never  . Smokeless tobacco: Never  Substance and Sexual Activity  . Alcohol use: No  . Drug use: No  . Sexual activity: Yes    Partners: Male  Other Topics Concern  . Not on file  Social History Narrative  Exercise-- walking    Social Determinants of Health   Financial Resource Strain: Not on file  Food Insecurity: Not on file  Transportation Needs: Not on file  Physical Activity: Not on file  Stress: Not on file  Social Connections: Not on file  Intimate Partner Violence: Not on file    Outpatient Medications Prior to Visit  Medication Sig Dispense Refill  . blood glucose meter kit and supplies Dispense based on patient and insurance preference.  Use as directed once a day.  Dx code: E11.9 1 each 0  . Multiple Vitamin (MULTIVITAMIN) tablet Take 1 tablet by mouth daily.    Marland Kitchen amLODipine (NORVASC) 5 MG tablet Take 1 tablet (5 mg total) by mouth daily. 90 tablet 3  . ammonium lactate (LAC-HYDRIN) 12 % cream Apply topically as needed for dry skin. 385 g 0  . atorvastatin (LIPITOR) 40 MG tablet Take 1 tablet (40 mg total) by mouth daily. 90 tablet 3  . carvedilol  (COREG) 25 MG tablet TAKE 1 TABLET BY MOUTH  TWICE DAILY WITH MEALS 180 tablet 3  . famotidine (PEPCID) 20 MG tablet Take 1 tablet (20 mg total) by mouth 2 (two) times daily. 180 tablet 3  . fluticasone (FLONASE) 50 MCG/ACT nasal spray Place 2 sprays into both nostrils daily as needed for allergies or rhinitis. 48 g 0  . furosemide (LASIX) 20 MG tablet Take 1 tablet (20 mg total) by mouth daily. 90 tablet 3  . glucose blood (FREESTYLE LITE) test strip Use as instructed once a day.  Dx code:  E11.9 100 each 1  . Lancets (FREESTYLE) lancets USE ONCE DAILY AS DIRECTED 100 each 1  . metFORMIN (GLUCOPHAGE-XR) 500 MG 24 hr tablet TAKE 2 TABLETS DAILY 180 tablet 3   No facility-administered medications prior to visit.    Allergies  Allergen Reactions  . Ace Inhibitors Swelling    Swelling  . Gumlog   . Codeine Nausea And Vomiting  . Fluviral [Influenza Virus Vaccine] Other (See Comments)    Asthma exacerabation    Review of Systems  Constitutional:  Negative for fever and malaise/fatigue.  HENT:  Negative for congestion.   Eyes:  Negative for blurred vision.  Respiratory:  Negative for shortness of breath.   Cardiovascular:  Negative for chest pain, palpitations and leg swelling.  Gastrointestinal:  Negative for abdominal pain, blood in stool and nausea.  Genitourinary:  Negative for dysuria and frequency.  Musculoskeletal:  Negative for falls.  Skin:  Negative for rash.  Neurological:  Negative for dizziness, loss of consciousness and headaches.  Endo/Heme/Allergies:  Negative for environmental allergies.  Psychiatric/Behavioral:  Negative for depression. The patient is not nervous/anxious.       Objective:    Physical Exam Vitals and nursing note reviewed.  Constitutional:      General: She is not in acute distress.    Appearance: Normal appearance. She is not ill-appearing.  HENT:     Head: Normocephalic and atraumatic.     Right Ear: External ear normal.     Left Ear:  External ear normal.  Eyes:     Extraocular Movements: Extraocular movements intact.     Pupils: Pupils are equal, round, and reactive to light.  Cardiovascular:     Rate and Rhythm: Normal rate and regular rhythm.     Heart sounds: Normal heart sounds. No murmur heard.   No gallop.  Pulmonary:     Effort: Pulmonary effort is normal. No respiratory distress.  Breath sounds: Normal breath sounds. No wheezing or rales.  Skin:    General: Skin is warm and dry.  Neurological:     Mental Status: She is alert and oriented to person, place, and time.  Psychiatric:        Behavior: Behavior normal.        Judgment: Judgment normal.    BP 126/84 (BP Location: Left Arm, Patient Position: Sitting, Cuff Size: Large)   Pulse 91   Temp 98.2 F (36.8 C) (Oral)   Resp 18   Ht 5' 5.5" (1.664 m)   Wt 280 lb 9.6 oz (127.3 kg)   SpO2 90%   BMI 45.98 kg/m  Wt Readings from Last 3 Encounters:  04/04/21 280 lb 9.6 oz (127.3 kg)  08/01/20 278 lb 9.6 oz (126.4 kg)  01/21/20 269 lb 9.6 oz (122.3 kg)    Diabetic Foot Exam - Simple   Simple Foot Form Diabetic Foot exam was performed with the following findings: Yes 04/04/2021  1:49 PM  Visual Inspection No deformities, no ulcerations, no other skin breakdown bilaterally: Yes Sensation Testing Intact to touch and monofilament testing bilaterally: Yes Pulse Check Posterior Tibialis and Dorsalis pulse intact bilaterally: Yes Comments    Lab Results  Component Value Date   WBC 8.5 08/01/2020   HGB 11.3 (L) 08/01/2020   HCT 34.7 (L) 08/01/2020   PLT 288.0 08/01/2020   GLUCOSE 124 (H) 04/04/2021   CHOL 133 04/04/2021   TRIG 108.0 04/04/2021   HDL 58.80 04/04/2021   LDLDIRECT 149.6 03/26/2013   LDLCALC 53 04/04/2021   ALT 18 04/04/2021   AST 19 04/04/2021   NA 136 04/04/2021   K 3.9 04/04/2021   CL 100 04/04/2021   CREATININE 0.80 04/04/2021   BUN 10 04/04/2021   CO2 26 04/04/2021   TSH 1.67 08/01/2020   INR 1.01 04/10/2011    HGBA1C 7.0 (H) 04/04/2021   MICROALBUR 1.9 04/04/2021    Lab Results  Component Value Date   TSH 1.67 08/01/2020   Lab Results  Component Value Date   WBC 8.5 08/01/2020   HGB 11.3 (L) 08/01/2020   HCT 34.7 (L) 08/01/2020   MCV 79.3 08/01/2020   PLT 288.0 08/01/2020   Lab Results  Component Value Date   NA 136 04/04/2021   K 3.9 04/04/2021   CO2 26 04/04/2021   GLUCOSE 124 (H) 04/04/2021   BUN 10 04/04/2021   CREATININE 0.80 04/04/2021   BILITOT 1.5 (H) 04/04/2021   ALKPHOS 62 04/04/2021   AST 19 04/04/2021   ALT 18 04/04/2021   PROT 8.5 (H) 04/04/2021   ALBUMIN 4.6 04/04/2021   CALCIUM 9.5 04/04/2021   ANIONGAP 10 11/24/2019   GFR 86.77 04/04/2021   Lab Results  Component Value Date   CHOL 133 04/04/2021   Lab Results  Component Value Date   HDL 58.80 04/04/2021   Lab Results  Component Value Date   LDLCALC 53 04/04/2021   Lab Results  Component Value Date   TRIG 108.0 04/04/2021   Lab Results  Component Value Date   CHOLHDL 2 04/04/2021   Lab Results  Component Value Date   HGBA1C 7.0 (H) 04/04/2021       Assessment & Plan:   Problem List Items Addressed This Visit       Unprioritized   Hyperlipidemia associated with type 2 diabetes mellitus ([place])   Relevant Medications   metFORMIN (GLUCOPHAGE-XR) 500 MG 24 hr tablet   furosemide (LASIX) 20 MG tablet  carvedilol (COREG) 25 MG tablet   atorvastatin (LIPITOR) 40 MG tablet   amLODipine (NORVASC) 5 MG tablet   Uncontrolled type 2 diabetes mellitus with hyperglycemia ([facility])   Relevant Medications   metFORMIN (GLUCOPHAGE-XR) 500 MG 24 hr tablet   Lancets (FREESTYLE) lancets   glucose blood (FREESTYLE LITE) test strip   atorvastatin (LIPITOR) 40 MG tablet   Essential hypertension   Relevant Medications   furosemide (LASIX) 20 MG tablet   carvedilol (COREG) 25 MG tablet   atorvastatin (LIPITOR) 40 MG tablet   amLODipine (NORVASC) 5 MG tablet   Other Visit Diagnoses     Colon cancer  screening    -  Primary   Relevant Orders   Ambulatory referral to Gastroenterology   Acute frontal sinusitis, recurrence not specified       Relevant Medications   fluticasone (FLONASE) 50 MCG/ACT nasal spray   Dyspepsia       Relevant Medications   famotidine (PEPCID) 20 MG tablet   Dry skin       Relevant Medications   ammonium lactate (LAC-HYDRIN) 12 % cream        Meds ordered this encounter  Medications  . metFORMIN (GLUCOPHAGE-XR) 500 MG 24 hr tablet    Sig: TAKE 2 TABLETS DAILY    Dispense:  180 tablet    Refill:  3  . Lancets (FREESTYLE) lancets    Sig: USE ONCE DAILY AS DIRECTED    Dispense:  100 each    Refill:  1  . glucose blood (FREESTYLE LITE) test strip    Sig: Use as instructed once a day.  Dx code:  E11.9    Dispense:  100 each    Refill:  1  . furosemide (LASIX) 20 MG tablet    Sig: Take 1 tablet (20 mg total) by mouth daily.    Dispense:  90 tablet    Refill:  3  . fluticasone (FLONASE) 50 MCG/ACT nasal spray    Sig: Place 2 sprays into both nostrils daily as needed for allergies or rhinitis.    Dispense:  48 g    Refill:  0  . famotidine (PEPCID) 20 MG tablet    Sig: Take 1 tablet (20 mg total) by mouth 2 (two) times daily.    Dispense:  180 tablet    Refill:  3  . carvedilol (COREG) 25 MG tablet    Sig: TAKE 1 TABLET BY MOUTH  TWICE DAILY WITH MEALS    Dispense:  180 tablet    Refill:  3  . atorvastatin (LIPITOR) 40 MG tablet    Sig: Take 1 tablet (40 mg total) by mouth daily.    Dispense:  90 tablet    Refill:  3  . amLODipine (NORVASC) 5 MG tablet    Sig: Take 1 tablet (5 mg total) by mouth daily.    Dispense:  90 tablet    Refill:  3  . ammonium lactate (LAC-HYDRIN) 12 % cream    Sig: Apply topically as needed for dry skin.    Dispense:  385 g    Refill:  0    I, Dr. [name], DO, personally preformed the services described in this documentation.  All medical record entries made by the scribe were at my direction and in  my presence.  I have reviewed the chart and discharge instructions (if applicable) and agree that the record reflects my personal performance and is accurate and complete. 04/04/2021   I,Michele Tran,acting as a  scribe for Ann Held, DO.,have documented all relevant documentation on the behalf of Ann Held, DO,as directed by  Ann Held, DO while in the presence of Ann Held, DO.   Ann Held, DO

## 2021-04-07 ENCOUNTER — Telehealth: Payer: Self-pay | Admitting: Family Medicine

## 2021-04-07 NOTE — Telephone Encounter (Signed)
[facility]: [phone] Reference #: [id number]  Pharmacy wants to see if with the following medication there is a monitor or pump the patient uses. If not they suggest 1 touch strip lifeskin. They will also provide a matching monitor for no additional charge if suggested 1 touch is used.   Lancets (FREESTYLE) lancets

## 2021-04-10 ENCOUNTER — Other Ambulatory Visit: Payer: Self-pay

## 2021-04-10 ENCOUNTER — Telehealth: Payer: Self-pay | Admitting: Family Medicine

## 2021-04-10 MED ORDER — ONETOUCH DELICA PLUS LANCET33G MISC: 1.0000 | Freq: Three times a day (TID) | 1 refills | Status: DC

## 2021-04-10 NOTE — Telephone Encounter (Signed)
Per patient, she has a one touch meter. Rx sent for one touch delica lancets.

## 2021-04-10 NOTE — Telephone Encounter (Signed)
Freestyle lite strips//meter is not covered by [employer]. [facility] is ok to change to covered "one touch verio"  The patient gets a meter at no charge from vender filling the one touch verio supplies. [facility] took a verbal and has changed the order to all one touch verio. Updated her list.

## 2021-04-11 NOTE — Telephone Encounter (Signed)
done

## 2021-04-24 ENCOUNTER — Encounter: Payer: Self-pay | Admitting: Family Medicine

## 2021-04-26 MED ORDER — FREESTYLE LITE W/DEVICE KIT: 1.0000 | PACK | Freq: Three times a day (TID) | 0 refills | Status: AC

## 2021-04-26 MED ORDER — ONETOUCH DELICA PLUS LANCET33G MISC: 1.0000 | Freq: Three times a day (TID) | 1 refills | Status: AC

## 2021-04-26 NOTE — Addendum Note (Signed)
Addended by: Maximino Sarin on: 04/26/2021 03:12 PM   Modules accepted: Orders

## 2021-04-28 ENCOUNTER — Other Ambulatory Visit: Payer: Self-pay

## 2021-04-28 MED ORDER — ONETOUCH VERIO W/DEVICE KIT: PACK | 0 refills | Status: AC

## 2021-08-17 ENCOUNTER — Encounter: Payer: Self-pay | Admitting: Family Medicine

## 2021-08-17 DIAGNOSIS — R1013 Epigastric pain: Secondary | ICD-10-CM

## 2021-08-17 DIAGNOSIS — L309 Dermatitis, unspecified: Secondary | ICD-10-CM

## 2021-08-18 MED ORDER — FLUOCINONIDE 0.05 % EX CREA
TOPICAL_CREAM | Freq: Two times a day (BID) | CUTANEOUS | 3 refills | Status: DC | PRN
Start: 2021-08-18 — End: 2021-08-24

## 2021-08-18 MED ORDER — FAMOTIDINE 20 MG PO TABS: 20.0000 mg | ORAL_TABLET | Freq: Two times a day (BID) | ORAL | 3 refills | Status: DC

## 2021-08-24 MED ORDER — FLUOCINONIDE 0.05 % EX CREA: TOPICAL_CREAM | Freq: Two times a day (BID) | CUTANEOUS | 3 refills | Status: DC | PRN

## 2021-08-24 MED ORDER — FAMOTIDINE 20 MG PO TABS: 20.0000 mg | ORAL_TABLET | Freq: Two times a day (BID) | ORAL | 3 refills | Status: AC

## 2021-08-29 ENCOUNTER — Other Ambulatory Visit (HOSPITAL_BASED_OUTPATIENT_CLINIC_OR_DEPARTMENT_OTHER): Payer: Self-pay | Admitting: Obstetrics and Gynecology

## 2021-08-29 DIAGNOSIS — Z1231 Encounter for screening mammogram for malignant neoplasm of breast: Secondary | ICD-10-CM

## 2021-09-05 ENCOUNTER — Encounter (HOSPITAL_BASED_OUTPATIENT_CLINIC_OR_DEPARTMENT_OTHER): Payer: Self-pay

## 2021-09-05 ENCOUNTER — Other Ambulatory Visit: Payer: Self-pay

## 2021-09-05 ENCOUNTER — Ambulatory Visit (HOSPITAL_BASED_OUTPATIENT_CLINIC_OR_DEPARTMENT_OTHER)
Admission: RE | Admit: 2021-09-05 | Discharge: 2021-09-05 | Disposition: A | Discharge status: Home / Self Care | Payer: Managed Care, Other (non HMO) | Source: Ambulatory Visit | Attending: Obstetrics and Gynecology | Admitting: Obstetrics and Gynecology

## 2021-09-05 DIAGNOSIS — Z1231 Encounter for screening mammogram for malignant neoplasm of breast: Secondary | ICD-10-CM | POA: Diagnosis present

## 2022-03-07 ENCOUNTER — Other Ambulatory Visit: Payer: Self-pay | Admitting: Family Medicine

## 2022-03-07 DIAGNOSIS — I10 Essential (primary) hypertension: Secondary | ICD-10-CM

## 2022-04-19 ENCOUNTER — Other Ambulatory Visit: Payer: Self-pay | Admitting: Family Medicine

## 2022-04-19 DIAGNOSIS — E1169 Type 2 diabetes mellitus with other specified complication: Secondary | ICD-10-CM

## 2022-04-19 DIAGNOSIS — E1165 Type 2 diabetes mellitus with hyperglycemia: Secondary | ICD-10-CM

## 2022-04-19 DIAGNOSIS — I10 Essential (primary) hypertension: Secondary | ICD-10-CM

## 2022-04-19 NOTE — Telephone Encounter (Signed)
I have called the pt and informed her that she has not been seen in the last year so in order to fill her medications she will need an OV. There was no answer so I left a message to call back.

## 2022-05-29 ENCOUNTER — Ambulatory Visit: Payer: Managed Care, Other (non HMO) | Admitting: Family Medicine

## 2022-06-07 ENCOUNTER — Encounter: Payer: Self-pay | Admitting: Family Medicine

## 2022-06-07 ENCOUNTER — Ambulatory Visit: Payer: Managed Care, Other (non HMO) | Admitting: Family Medicine

## 2022-06-07 VITALS — BP 130/80 | HR 70 | Temp 98.4°F | Resp 18 | Ht 65.5 in | Wt 276.8 lb

## 2022-06-07 DIAGNOSIS — E1169 Type 2 diabetes mellitus with other specified complication: Secondary | ICD-10-CM | POA: Diagnosis not present

## 2022-06-07 DIAGNOSIS — J0110 Acute frontal sinusitis, unspecified: Secondary | ICD-10-CM | POA: Diagnosis not present

## 2022-06-07 DIAGNOSIS — I10 Essential (primary) hypertension: Secondary | ICD-10-CM

## 2022-06-07 DIAGNOSIS — E785 Hyperlipidemia, unspecified: Secondary | ICD-10-CM

## 2022-06-07 DIAGNOSIS — E1165 Type 2 diabetes mellitus with hyperglycemia: Secondary | ICD-10-CM | POA: Diagnosis not present

## 2022-06-07 DIAGNOSIS — Z1211 Encounter for screening for malignant neoplasm of colon: Secondary | ICD-10-CM

## 2022-06-07 LAB — MICROALBUMIN / CREATININE URINE RATIO
Creatinine,U: 189.4 mg/dL
Microalb Creat Ratio: 0.8 mg/g (ref 0.0–30.0)
Microalb, Ur: 1.6 mg/dL (ref 0.0–1.9)

## 2022-06-07 LAB — CBC WITH DIFFERENTIAL/PLATELET
Basophils Absolute: 0 10*3/uL (ref 0.0–0.1)
Basophils Relative: 0.5 % (ref 0.0–3.0)
Eosinophils Absolute: 0.2 10*3/uL (ref 0.0–0.7)
Eosinophils Relative: 3.1 % (ref 0.0–5.0)
HCT: 36.8 % (ref 36.0–46.0)
Hemoglobin: 11.7 g/dL — ABNORMAL LOW (ref 12.0–15.0)
Lymphocytes Relative: 18.3 % (ref 12.0–46.0)
Lymphs Abs: 1.5 10*3/uL (ref 0.7–4.0)
MCHC: 31.7 g/dL (ref 30.0–36.0)
MCV: 77.8 fl — ABNORMAL LOW (ref 78.0–100.0)
Monocytes Absolute: 0.5 10*3/uL (ref 0.1–1.0)
Monocytes Relative: 6.1 % (ref 3.0–12.0)
Neutro Abs: 5.8 10*3/uL (ref 1.4–7.7)
Neutrophils Relative %: 72 % (ref 43.0–77.0)
Platelets: 354 10*3/uL (ref 150.0–400.0)
RBC: 4.73 Mil/uL (ref 3.87–5.11)
RDW: 16.4 % — ABNORMAL HIGH (ref 11.5–15.5)
WBC: 8 10*3/uL (ref 4.0–10.5)

## 2022-06-07 LAB — COMPREHENSIVE METABOLIC PANEL
ALT: 16 U/L (ref 0–35)
AST: 17 U/L (ref 0–37)
Albumin: 4.6 g/dL (ref 3.5–5.2)
Alkaline Phosphatase: 70 U/L (ref 39–117)
BUN: 11 mg/dL (ref 6–23)
CO2: 28 mEq/L (ref 19–32)
Calcium: 9.6 mg/dL (ref 8.4–10.5)
Chloride: 100 mEq/L (ref 96–112)
Creatinine, Ser: 0.7 mg/dL (ref 0.40–1.20)
GFR: 101.02 mL/min (ref >60.00)
Glucose, Bld: 112 mg/dL — ABNORMAL HIGH (ref 70–99)
Potassium: 4.1 mEq/L (ref 3.5–5.1)
Sodium: 139 mEq/L (ref 135–145)
Total Bilirubin: 1.2 mg/dL (ref 0.2–1.2)
Total Protein: 8.2 g/dL (ref 6.0–8.3)

## 2022-06-07 LAB — LIPID PANEL
Cholesterol: 139 mg/dL (ref 0–200)
HDL: 55.4 mg/dL (ref >39.00)
LDL Cholesterol: 61 mg/dL (ref 0–99)
NonHDL: 83.32
Total CHOL/HDL Ratio: 3
Triglycerides: 111 mg/dL (ref 0.0–149.0)
VLDL: 22.2 mg/dL (ref 0.0–40.0)

## 2022-06-07 LAB — HEMOGLOBIN A1C: Hgb A1c MFr Bld: 7.3 % — ABNORMAL HIGH (ref 4.6–6.5)

## 2022-06-07 MED ORDER — ATORVASTATIN CALCIUM 40 MG PO TABS: 40.0000 mg | ORAL_TABLET | Freq: Every day | ORAL | 3 refills | Status: DC

## 2022-06-07 MED ORDER — FLUTICASONE PROPIONATE 50 MCG/ACT NA SUSP: 2.0000 | Freq: Every day | NASAL | 3 refills | Status: DC | PRN

## 2022-06-07 MED ORDER — AMLODIPINE BESYLATE 5 MG PO TABS: 5.0000 mg | ORAL_TABLET | Freq: Every day | ORAL | 3 refills | Status: DC

## 2022-06-07 MED ORDER — CARVEDILOL 25 MG PO TABS: ORAL_TABLET | ORAL | 3 refills | Status: DC

## 2022-06-07 MED ORDER — FUROSEMIDE 20 MG PO TABS: 20.0000 mg | ORAL_TABLET | Freq: Every day | ORAL | 3 refills | Status: DC

## 2022-06-07 MED ORDER — METFORMIN HCL ER 500 MG PO TB24: 1000.0000 mg | ORAL_TABLET | Freq: Every day | ORAL | 3 refills | Status: DC

## 2022-06-07 NOTE — Patient Instructions (Signed)

## 2022-06-07 NOTE — Progress Notes (Signed)
Subjective:   By signing my name below, I, Michele Tran, attest that this documentation has been prepared under the direction and in the presence of Michele Tran, 06/07/2022.    Patient ID: Michele Tran, female    DOB: July 31, 1970, 51 y.o.   MRN: [id number]  Chief Complaint  Patient presents with   Hypertension   Diabetes   Follow-up    HPI Patient is in today for an office visit.   Hypertension Patients blood pressure is slightly high this visit. She is complaint with 5 mg Norvasc and 25 mg Coreg.  Diabetes Patient is complaint with her 500 mg Metformin.  Immunizations She is uninterested in receiving 1 of 2 Shingles vaccine today.  Rash Patient reports that she thought she had an UTI last week and bought cranberry juice. She states that she broke out after drinking the cranberry juice. She explains that she is allergic to cherries and checked the label on the juice which said it had other types of juices for coloring. Patient suspects there may have been cherries added in. She expresses that the breakout is now drying up. It is on her arms, back, and stomach with slight itching.  Health Maintenance Due  Topic Date Due   OPHTHALMOLOGY EXAM  Never done   COLONOSCOPY (Pts 45-15yr Insurance coverage will need to be confirmed)  Never done   PAP SMEAR-Modifier  02/09/2018   HEMOGLOBIN A1C  10/03/2021   COVID-19 Vaccine (6 - 2023-24 season) 02/16/2022   Diabetic kidney evaluation - eGFR measurement  04/04/2022   Diabetic kidney evaluation - Urine ACR  04/04/2022    Past Medical History:  Diagnosis Date   Abdominal bloating    Anemia    Asthma    Diabetes mellitus without complication (HCC)    type 2   GERD (gastroesophageal reflux disease)    Hirsutism    History of COVID-19    06/2019   Hyperlipidemia    Hypertension    Obesity, morbid ([place]    Polycystic ovaries     Past Surgical History:  Procedure Laterality Date   THYROIDECTOMY Right 11/27/2019    Procedure: RIGHT HEMI THYROIDECTOMY;  Surgeon: TLeta Baptist MD;  Location: [facility]  Service: ENT;  Laterality: Right;   UTERINE polyps  05-31-10   D &C  [facility]    Family History  Problem Relation Age of Onset   Stroke Mother 757      1st degree relative    Coronary artery disease Paternal Uncle        late 517s  Leukemia Paternal Uncle    Cancer Father 733      advanced head , neck and throat ca   Arthritis Other    Diabetes Other        1st degree relative   Hyperlipidemia Other    Hypertension Other    Lung cancer Paternal Aunt    Cancer Paternal Aunt        breast, lung   Breast cancer Paternal Aunt     Social History   Socioeconomic History   Marital status: Single    Spouse name: Not on file   Number of children: Not on file   Years of education: Not on file   Highest education level: Not on file  Occupational History   Occupation: levalor  Tobacco Use   Smoking status: Never   Smokeless tobacco: Never  Substance and Sexual Activity   Alcohol use: No  Drug use: No   Sexual activity: Yes    Partners: Male  Other Topics Concern   Not on file  Social History Narrative   Exercise-- walking    Social Determinants of Health   Financial Resource Strain: Not on file  Food Insecurity: Not on file  Transportation Needs: Not on file  Physical Activity: Not on file  Stress: Not on file  Social Connections: Not on file  Intimate Partner Violence: Not on file    Outpatient Medications Prior to Visit  Medication Sig Dispense Refill   ammonium lactate (LAC-HYDRIN) 12 % cream Apply topically as needed for dry skin. 385 g 0   Blood Glucose Monitoring Suppl (FREESTYLE LITE) w/Device KIT 1 kit by Does not apply route 3 (three) times daily. 1 kit 0   Blood Glucose Monitoring Suppl (ONETOUCH VERIO) w/Device KIT Use to check blood sugars. 1 kit 0   famotidine (PEPCID) 20 MG tablet Take 1 tablet (20 mg total) by mouth 2 (two) times daily. 180 tablet 3    fluocinonide cream (LIDEX) 0.05 % Apply topically 2 (two) times daily as needed. 60 g 3   glucose blood (ONETOUCH VERIO) test strip 1 each by Other route as needed for other. Use as instructed     Lancets (ONETOUCH DELICA PLUS [id number]) MISC 1 each by Does not apply route in the morning, at noon, and at bedtime. Cancell any other prescriptions for freestyle lancets. Patient has a one touch meter. Thanks. 100 each 1   Multiple Vitamin (MULTIVITAMIN) tablet Take 1 tablet by mouth daily.     amLODipine (NORVASC) 5 MG tablet TAKE 1 TABLET DAILY 90 tablet 3   atorvastatin (LIPITOR) 40 MG tablet TAKE 1 TABLET DAILY 90 tablet 3   carvedilol (COREG) 25 MG tablet TAKE 1 TABLET TWICE A DAY WITH MEALS 180 tablet 3   fluticasone (FLONASE) 50 MCG/ACT nasal spray Place 2 sprays into both nostrils daily as needed for allergies or rhinitis. 48 g 0   furosemide (LASIX) 20 MG tablet TAKE 1 TABLET DAILY 90 tablet 3   metFORMIN (GLUCOPHAGE-XR) 500 MG 24 hr tablet TAKE 2 TABLETS DAILY 180 tablet 3   No facility-administered medications prior to visit.    Allergies  Allergen Reactions   Ace Inhibitors Swelling    Swelling   Cherry    Codeine Nausea And Vomiting   Fluviral [Influenza Virus Vaccine] Other (See Comments)    Asthma exacerabation    Review of Systems  Constitutional:  Negative for chills, fever and malaise/fatigue.  HENT:  Negative for congestion and hearing loss.   Eyes:  Negative for discharge.  Respiratory:  Negative for cough, sputum production and shortness of breath.   Cardiovascular:  Negative for chest pain, palpitations and leg swelling.  Gastrointestinal:  Negative for abdominal pain, blood in stool, constipation, diarrhea, heartburn, nausea and vomiting.  Genitourinary:  Negative for dysuria, frequency, hematuria and urgency.  Musculoskeletal:  Negative for back pain, falls and myalgias.  Skin:  Positive for itching and rash.  Neurological:  Negative for dizziness, sensory change,  loss of consciousness, weakness and headaches.  Endo/Heme/Allergies:  Negative for environmental allergies. Does not bruise/bleed easily.  Psychiatric/Behavioral:  Negative for depression and suicidal ideas. The patient is not nervous/anxious and does not have insomnia.        Objective:    Physical Exam Vitals and nursing note reviewed.  Constitutional:      General: She is not in acute distress.    Appearance: Normal  appearance. She is not ill-appearing.  HENT:     Head: Normocephalic and atraumatic.     Right Ear: External ear normal.     Left Ear: External ear normal.  Eyes:     Extraocular Movements: Extraocular movements intact.     Pupils: Pupils are equal, round, and reactive to light.  Cardiovascular:     Rate and Rhythm: Normal rate and regular rhythm.     Heart sounds: Normal heart sounds. No murmur heard.    No gallop.  Pulmonary:     Effort: Pulmonary effort is normal. No respiratory distress.     Breath sounds: Normal breath sounds. No wheezing or rales.  Skin:    General: Skin is warm and dry.  Neurological:     Mental Status: She is alert and oriented to person, place, and time.  Psychiatric:        Judgment: Judgment normal.     BP 130/80 (BP Location: Left Arm, Patient Position: Sitting, Cuff Size: Large)   Pulse 70   Temp 98.4 F (36.9 C) (Oral)   Resp 18   Ht 5' 5.5" (1.664 m)   Wt 276 lb 12.8 oz (125.6 kg)   SpO2 99%   BMI 45.36 kg/m  Wt Readings from Last 3 Encounters:  06/07/22 276 lb 12.8 oz (125.6 kg)  04/04/21 280 lb 9.6 oz (127.3 kg)  08/01/20 278 lb 9.6 oz (126.4 kg)       Assessment & Plan:   Problem List Items Addressed This Visit       Unprioritized   Uncontrolled type 2 diabetes mellitus with hyperglycemia ([place])    hgba1c to be checked  minimize simple carbs. Increase exercise as tolerated. Continue current meds       Relevant Medications   atorvastatin (LIPITOR) 40 MG tablet   metFORMIN (GLUCOPHAGE-XR) 500 MG 24 hr  tablet   Hyperlipidemia associated with type 2 diabetes mellitus ([place])    Tolerating statin, encouraged heart healthy diet, avoid trans fats, minimize simple carbs and saturated fats. Increase exercise as tolerated       Relevant Medications   amLODipine (NORVASC) 5 MG tablet   atorvastatin (LIPITOR) 40 MG tablet   carvedilol (COREG) 25 MG tablet   furosemide (LASIX) 20 MG tablet   metFORMIN (GLUCOPHAGE-XR) 500 MG 24 hr tablet   Other Relevant Orders   Lipid panel   CBC with Differential/Platelet   Comprehensive metabolic panel   Hemoglobin A1c   Microalbumin / creatinine urine ratio   Essential hypertension    Well controlled, no changes to meds. Encouraged heart healthy diet such as the DASH diet and exercise as tolerated.        Relevant Medications   amLODipine (NORVASC) 5 MG tablet   atorvastatin (LIPITOR) 40 MG tablet   carvedilol (COREG) 25 MG tablet   furosemide (LASIX) 20 MG tablet   Other Relevant Orders   Lipid panel   CBC with Differential/Platelet   Comprehensive metabolic panel   Hemoglobin A1c   Microalbumin / creatinine urine ratio   Other Visit Diagnoses     Type 2 diabetes mellitus with hyperglycemia, without long-term current use of insulin (HCC)    -  Primary   Relevant Medications   atorvastatin (LIPITOR) 40 MG tablet   metFORMIN (GLUCOPHAGE-XR) 500 MG 24 hr tablet   Other Relevant Orders   Lipid panel   CBC with Differential/Platelet   Comprehensive metabolic panel   Hemoglobin A1c   Microalbumin / creatinine urine ratio  Acute frontal sinusitis, recurrence not specified       Relevant Medications   fluticasone (FLONASE) 50 MCG/ACT nasal spray   Colon cancer screening       Relevant Orders   Ambulatory referral to Gastroenterology      Meds ordered this encounter  Medications   amLODipine (NORVASC) 5 MG tablet    Sig: Take 1 tablet (5 mg total) by mouth daily.    Dispense:  90 tablet    Refill:  3   atorvastatin (LIPITOR) 40 MG tablet     Sig: Take 1 tablet (40 mg total) by mouth daily.    Dispense:  90 tablet    Refill:  3   carvedilol (COREG) 25 MG tablet    Sig: TAKE 1 TABLET TWICE A DAY WITH MEALS    Dispense:  180 tablet    Refill:  3   fluticasone (FLONASE) 50 MCG/ACT nasal spray    Sig: Place 2 sprays into both nostrils daily as needed for allergies or rhinitis.    Dispense:  48 g    Refill:  3   furosemide (LASIX) 20 MG tablet    Sig: Take 1 tablet (20 mg total) by mouth daily.    Dispense:  90 tablet    Refill:  3   metFORMIN (GLUCOPHAGE-XR) 500 MG 24 hr tablet    Sig: Take 2 tablets (1,000 mg total) by mouth daily.    Dispense:  180 tablet    Refill:  3    I, Michele Tran, personally preformed the services described in this documentation.  All medical record entries made by the scribe were at my direction and in my presence.  I have reviewed the chart and discharge instructions (if applicable) and agree that the record reflects my personal performance and is accurate and complete. 06/07/2022.   I,Verona Buck,acting as a Education administrator for [employer], DO.,have documented all relevant documentation on the behalf of Ann Held, DO,as directed by  Ann Held, DO while in the presence of Ann Held, DO.    Ann Held, DO

## 2022-06-07 NOTE — Assessment & Plan Note (Signed)
Tolerating statin, encouraged heart healthy diet, avoid trans fats, minimize simple carbs and saturated fats. Increase exercise as tolerated 

## 2022-06-07 NOTE — Assessment & Plan Note (Signed)
hgba1c to be checked minimize simple carbs. Increase exercise as tolerated. Continue current meds 

## 2022-06-07 NOTE — Assessment & Plan Note (Signed)
Well controlled, no changes to meds. Encouraged heart healthy diet such as the DASH diet and exercise as tolerated.  °

## 2022-06-12 ENCOUNTER — Other Ambulatory Visit: Payer: Self-pay | Admitting: Family Medicine

## 2022-06-12 DIAGNOSIS — E1169 Type 2 diabetes mellitus with other specified complication: Secondary | ICD-10-CM

## 2022-06-12 DIAGNOSIS — E1165 Type 2 diabetes mellitus with hyperglycemia: Secondary | ICD-10-CM

## 2022-06-12 DIAGNOSIS — I10 Essential (primary) hypertension: Secondary | ICD-10-CM

## 2022-06-13 ENCOUNTER — Encounter: Payer: Self-pay | Admitting: Family Medicine

## 2022-06-13 DIAGNOSIS — E1165 Type 2 diabetes mellitus with hyperglycemia: Secondary | ICD-10-CM

## 2022-06-19 ENCOUNTER — Telehealth: Payer: Self-pay

## 2022-06-19 MED ORDER — OZEMPIC (0.25 OR 0.5 MG/DOSE) 2 MG/3ML ~~LOC~~ SOPN: PEN_INJECTOR | SUBCUTANEOUS | 3 refills | Status: DC

## 2022-06-19 NOTE — Telephone Encounter (Signed)
PA initiated via Covermymeds; KEY: [id number]. PA approved.   This request has been approved using information available on the patient's profile. [id number];HBZJIR:[id number];Review Type:Prior Auth;Coverage Start Date:05/20/2022;Coverage End Date:06/19/2023

## 2022-06-22 MED ORDER — OZEMPIC (0.25 OR 0.5 MG/DOSE) 2 MG/3ML ~~LOC~~ SOPN: PEN_INJECTOR | SUBCUTANEOUS | 3 refills | Status: DC

## 2022-07-16 MED ORDER — OZEMPIC (0.25 OR 0.5 MG/DOSE) 2 MG/3ML ~~LOC~~ SOPN: PEN_INJECTOR | SUBCUTANEOUS | 3 refills | Status: DC

## 2022-08-27 ENCOUNTER — Other Ambulatory Visit: Payer: Self-pay | Admitting: Family Medicine

## 2022-08-27 DIAGNOSIS — L309 Dermatitis, unspecified: Secondary | ICD-10-CM

## 2022-09-11 ENCOUNTER — Other Ambulatory Visit (HOSPITAL_BASED_OUTPATIENT_CLINIC_OR_DEPARTMENT_OTHER): Payer: Self-pay | Admitting: Family Medicine

## 2022-09-11 DIAGNOSIS — Z1231 Encounter for screening mammogram for malignant neoplasm of breast: Secondary | ICD-10-CM

## 2022-09-25 ENCOUNTER — Encounter (HOSPITAL_BASED_OUTPATIENT_CLINIC_OR_DEPARTMENT_OTHER): Payer: Self-pay

## 2022-09-25 ENCOUNTER — Ambulatory Visit (HOSPITAL_BASED_OUTPATIENT_CLINIC_OR_DEPARTMENT_OTHER)
Admission: RE | Admit: 2022-09-25 | Discharge: 2022-09-25 | Disposition: A | Discharge status: Home / Self Care | Payer: Managed Care, Other (non HMO) | Source: Ambulatory Visit | Attending: Family Medicine | Admitting: Family Medicine

## 2022-09-25 DIAGNOSIS — Z1231 Encounter for screening mammogram for malignant neoplasm of breast: Secondary | ICD-10-CM | POA: Diagnosis present

## 2022-10-15 LAB — HM PAP SMEAR: HM Pap smear: NEGATIVE

## 2022-10-22 ENCOUNTER — Encounter: Payer: Self-pay | Admitting: Gastroenterology

## 2022-10-22 ENCOUNTER — Encounter: Payer: Self-pay | Admitting: Family Medicine

## 2022-10-22 DIAGNOSIS — Z1211 Encounter for screening for malignant neoplasm of colon: Secondary | ICD-10-CM

## 2022-10-29 ENCOUNTER — Encounter: Payer: Self-pay | Admitting: Family Medicine

## 2022-10-29 DIAGNOSIS — E1165 Type 2 diabetes mellitus with hyperglycemia: Secondary | ICD-10-CM

## 2022-10-29 MED ORDER — OZEMPIC (0.25 OR 0.5 MG/DOSE) 2 MG/3ML ~~LOC~~ SOPN
0.5000 mg | PEN_INJECTOR | SUBCUTANEOUS | 3 refills | Status: DC
Start: 2022-10-29 — End: 2023-02-26

## 2022-12-03 ENCOUNTER — Ambulatory Visit (AMBULATORY_SURGERY_CENTER): Payer: Managed Care, Other (non HMO)

## 2022-12-03 VITALS — Ht 65.5 in | Wt 262.0 lb

## 2022-12-03 DIAGNOSIS — Z1211 Encounter for screening for malignant neoplasm of colon: Secondary | ICD-10-CM

## 2022-12-03 MED ORDER — NA SULFATE-K SULFATE-MG SULF 17.5-3.13-1.6 GM/177ML PO SOLN
1.0000 | Freq: Once | ORAL | 0 refills | Status: AC
Start: 2022-12-03 — End: 2022-12-03

## 2022-12-03 NOTE — Progress Notes (Signed)
No egg or soy allergy known to patient  No issues known to pt with past sedation with any surgeries or procedures Patient denies ever being told they had issues or difficulty with intubation  No FH of Malignant Hyperthermia Pt is not on diet pills Pt is not on  home 02  Pt is not on blood thinners  Pt denies issues with constipation  No A fib or A flutter Have any cardiac testing pending--no Pt instructed to use [email or web address] or GoodRx for a price reduction on prep  Can ambulate independently  

## 2022-12-11 ENCOUNTER — Encounter: Payer: Self-pay | Admitting: Gastroenterology

## 2022-12-26 ENCOUNTER — Encounter: Payer: Managed Care, Other (non HMO) | Admitting: Gastroenterology

## 2023-01-09 ENCOUNTER — Encounter: Payer: Self-pay | Admitting: Gastroenterology

## 2023-01-09 ENCOUNTER — Ambulatory Visit: Payer: Managed Care, Other (non HMO) | Admitting: Gastroenterology

## 2023-01-09 VITALS — BP 143/83 | HR 72 | Temp 97.5°F | Resp 13 | Ht 65.5 in | Wt 262.0 lb

## 2023-01-09 DIAGNOSIS — Z1211 Encounter for screening for malignant neoplasm of colon: Secondary | ICD-10-CM | POA: Diagnosis present

## 2023-01-09 DIAGNOSIS — D122 Benign neoplasm of ascending colon: Secondary | ICD-10-CM

## 2023-01-09 DIAGNOSIS — K635 Polyp of colon: Secondary | ICD-10-CM

## 2023-01-09 DIAGNOSIS — D124 Benign neoplasm of descending colon: Secondary | ICD-10-CM | POA: Diagnosis not present

## 2023-01-09 MED ORDER — DEXTROSE 5 % IV SOLN
INTRAVENOUS | Status: AC
Start: 2023-01-09 — End: 2023-01-09

## 2023-01-09 MED ORDER — SODIUM CHLORIDE 0.9 % IV SOLN
500.0000 mL | Freq: Once | INTRAVENOUS | Status: DC
Start: 2023-01-09 — End: 2023-01-09

## 2023-01-09 NOTE — Progress Notes (Unsigned)
Report to PACU, RN, vss, BBS= Clear.  

## 2023-01-09 NOTE — Progress Notes (Unsigned)
CBG 65.   Vitals-DT  Pt's states no medical or surgical changes since previsit or office visit.Pt's states no medical or surgical changes since previsit or office visit.  Patient has D5W running.  No D50 was given to this time per Alease Frame, RN  Patient states that she "feels almost normal now."  1252  1305  CBG    68  See  P [place]yl[place] note  CBG  95 1:26 pm

## 2023-01-09 NOTE — Progress Notes (Signed)
[facility] History and Physical   Primary Care Physician:  Zola Button, Grayling Congress, DO   Reason for Procedure:  Colorectal cancer screening  Plan:    Screening colonoscopy with possible interventions as needed     HPI: Michele Tran is a very pleasant 52 y.o. female here for screening colonoscopy. Denies any nausea, vomiting, abdominal pain, melena or bright red blood per rectum  The risks and benefits as well as alternatives of endoscopic procedure(s) have been discussed and reviewed. All questions answered. The patient agrees to proceed.    Past Medical History:  Diagnosis Date   Abdominal bloating    Anemia    Asthma    Diabetes mellitus without complication (HCC)    type 2   GERD (gastroesophageal reflux disease)    Hirsutism    History of COVID-19    06/2019   Hyperlipidemia    Hypertension    Obesity, morbid (HCC)    Polycystic ovaries     Past Surgical History:  Procedure Laterality Date   THYROIDECTOMY Right 11/27/2019   Procedure: RIGHT HEMI THYROIDECTOMY;  Surgeon: Newman Pies, MD;  Location: [facility];  Service: ENT;  Laterality: Right;   UTERINE polyps  05-31-10   D &C  [facility]    Prior to Admission medications   Medication Sig Start Date End Date Taking? Authorizing Provider  amLODipine (NORVASC) 5 MG tablet Take 1 tablet (5 mg total) by mouth daily. 06/07/22  Yes Seabron Spates R, DO  atorvastatin (LIPITOR) 40 MG tablet Take 1 tablet (40 mg total) by mouth daily. 06/07/22  Yes Seabron Spates R, DO  Blood Glucose Monitoring Suppl (FREESTYLE LITE) w/Device KIT 1 kit by Does not apply route 3 (three) times daily. 04/26/21  Yes Seabron Spates R, DO  Blood Glucose Monitoring Suppl (ONETOUCH VERIO) w/Device KIT Use to check blood sugars. 04/28/21  Yes Seabron Spates R, DO  carvedilol (COREG) 25 MG tablet TAKE 1 TABLET TWICE A DAY WITH MEALS 06/07/22  Yes Seabron Spates R, DO  famotidine (PEPCID) 20 MG tablet  Take 1 tablet (20 mg total) by mouth 2 (two) times daily. 08/24/21  Yes Seabron Spates R, DO  furosemide (LASIX) 20 MG tablet Take 1 tablet (20 mg total) by mouth daily. 06/07/22  Yes Seabron Spates R, DO  glucose blood (ONETOUCH VERIO) test strip 1 each by Other route as needed for other. Use as instructed   Yes [provider]  Lancets (ONETOUCH DELICA PLUS LANCET33G) MISC 1 each by Does not apply route in the morning, at noon, and at bedtime. Cancell any other prescriptions for freestyle lancets. Patient has a one touch meter. Thanks. 04/26/21  Yes Seabron Spates R, DO  metFORMIN (GLUCOPHAGE-XR) 500 MG 24 hr tablet Take 2 tablets (1,000 mg total) by mouth daily. 06/07/22  Yes Seabron Spates R, DO  ammonium lactate (LAC-HYDRIN) 12 % cream Apply topically as needed for dry skin. Patient not taking: Reported on 12/03/2022 04/04/21   Zola Button, Myrene Buddy R, DO  fluocinonide cream (LIDEX) 0.05 % APPLY TOPICALLY TO THE AFFECTED AREA TWICE DAILY AS NEEDED 08/28/22   Zola Button, Grayling Congress, DO  fluticasone (FLONASE) 50 MCG/ACT nasal spray Place 2 sprays into both nostrils daily as needed for allergies or rhinitis. 06/07/22   Donato Schultz, DO  Multiple Vitamin (MULTIVITAMIN) tablet Take 1 tablet by mouth daily.    [provider]  Prenatal Vit-Fe Fumarate-FA (MULTIVITAMIN-PRENATAL) 27-0.8  MG TABS tablet Take 1 tablet by mouth daily at 12 noon.    [provider]  Semaglutide,0.25 or 0.5MG /DOS, (OZEMPIC, 0.25 OR 0.5 MG/DOSE,) 2 MG/3ML SOPN Inject 0.5 mg into the skin once a week. 10/29/22   Donato Schultz, DO    Current Outpatient Medications  Medication Sig Dispense Refill   amLODipine (NORVASC) 5 MG tablet Take 1 tablet (5 mg total) by mouth daily. 90 tablet 3   atorvastatin (LIPITOR) 40 MG tablet Take 1 tablet (40 mg total) by mouth daily. 90 tablet 3   Blood Glucose Monitoring Suppl (FREESTYLE LITE) w/Device KIT 1 kit by Does not apply route 3  (three) times daily. 1 kit 0   Blood Glucose Monitoring Suppl (ONETOUCH VERIO) w/Device KIT Use to check blood sugars. 1 kit 0   carvedilol (COREG) 25 MG tablet TAKE 1 TABLET TWICE A DAY WITH MEALS 180 tablet 3   famotidine (PEPCID) 20 MG tablet Take 1 tablet (20 mg total) by mouth 2 (two) times daily. 180 tablet 3   furosemide (LASIX) 20 MG tablet Take 1 tablet (20 mg total) by mouth daily. 90 tablet 3   glucose blood (ONETOUCH VERIO) test strip 1 each by Other route as needed for other. Use as instructed     Lancets (ONETOUCH DELICA PLUS LANCET33G) MISC 1 each by Does not apply route in the morning, at noon, and at bedtime. Cancell any other prescriptions for freestyle lancets. Patient has a one touch meter. Thanks. 100 each 1   metFORMIN (GLUCOPHAGE-XR) 500 MG 24 hr tablet Take 2 tablets (1,000 mg total) by mouth daily. 180 tablet 3   ammonium lactate (LAC-HYDRIN) 12 % cream Apply topically as needed for dry skin. (Patient not taking: Reported on 12/03/2022) 385 g 0   fluocinonide cream (LIDEX) 0.05 % APPLY TOPICALLY TO THE AFFECTED AREA TWICE DAILY AS NEEDED 60 g 3   fluticasone (FLONASE) 50 MCG/ACT nasal spray Place 2 sprays into both nostrils daily as needed for allergies or rhinitis. 48 g 3   Multiple Vitamin (MULTIVITAMIN) tablet Take 1 tablet by mouth daily.     Prenatal Vit-Fe Fumarate-FA (MULTIVITAMIN-PRENATAL) 27-0.8 MG TABS tablet Take 1 tablet by mouth daily at 12 noon.     Semaglutide,0.25 or 0.5MG /DOS, (OZEMPIC, 0.25 OR 0.5 MG/DOSE,) 2 MG/3ML SOPN Inject 0.5 mg into the skin once a week. 1.5 mL 3   Current Facility-Administered Medications  Medication Dose Route Frequency Provider Last Rate Last Admin   0.9 %  sodium chloride infusion  500 mL Intravenous Once Laporshia Hogen, Eleonore Chiquito, MD       dextrose 5 % solution   Intravenous Continuous Angeleigh Chiasson, Eleonore Chiquito, MD        Allergies as of 01/09/2023 - Review Complete 01/09/2023  Allergen Reaction Noted   Ace inhibitors Swelling  04/17/2011   Cherry  07/14/2019   Codeine Nausea And Vomiting 06/25/2008   Fluviral [influenza virus vaccine] Other (See Comments) 03/26/2013   Lisinopril Swelling 12/03/2022    Family History  Problem Relation Age of Onset   Stroke Mother 63       1st degree relative    Esophageal cancer Father    Cancer Father 10       advanced head , neck and throat ca   Pancreatic cancer Maternal Aunt    Lung cancer Paternal Aunt    Cancer Paternal Aunt        breast, lung   Breast cancer Paternal Aunt    Coronary  artery disease Paternal Uncle        late 77s   Leukemia Paternal Uncle    Arthritis Other    Diabetes Other        1st degree relative   Hyperlipidemia Other    Hypertension Other    Colon cancer Neg Hx    Colon polyps Neg Hx    Rectal cancer Neg Hx    Stomach cancer Neg Hx     Social History   Socioeconomic History   Marital status: Single    Spouse name: Not on file   Number of children: Not on file   Years of education: Not on file   Highest education level: Not on file  Occupational History   Occupation: levalor  Tobacco Use   Smoking status: Never   Smokeless tobacco: Never  Vaping Use   Vaping status: Never Used  Substance and Sexual Activity   Alcohol use: No   Drug use: No   Sexual activity: Yes    Partners: Male  Other Topics Concern   Not on file  Social History Narrative   Exercise-- walking    Social Determinants of Health   Financial Resource Strain: Not on file  Food Insecurity: Not on file  Transportation Needs: Not on file  Physical Activity: Not on file  Stress: Not on file  Social Connections: Not on file  Intimate Partner Violence: Not on file    Review of Systems:  All other review of systems negative except as mentioned in the HPI.  Physical Exam: Vital signs in last 24 hours: BP 131/68 (BP Location: Right Arm, Patient Position: Sitting, Cuff Size: Normal)   Pulse 77   Temp (!) 97.5 F (36.4 C) (Temporal)   Ht 5' 5.5"  (1.664 m)   Wt 262 lb (118.8 kg)   LMP 12/18/2022   SpO2 99%   BMI 42.94 kg/m  General:   Alert, NAD Lungs:  Clear .   Heart:  Regular rate and rhythm Abdomen:  Soft, nontender and nondistended. Neuro/Psych:  Alert and cooperative. Normal mood and affect. A and O x 3  Reviewed labs, radiology imaging, old records and pertinent past GI work up  Patient is appropriate for planned procedure(s) and anesthesia in an ambulatory setting   K. Scherry Ran , MD [phone]

## 2023-01-09 NOTE — Op Note (Signed)
[facility] Patient Name: Michele Tran Procedure Date: 01/09/2023 1:20 PM MRN: [id number] Endoscopist: Napoleon Form , MD, [id number] Age: 52 Referring MD:  Date of Birth: 1971/02/13 Gender: Female Account #: [id number] Procedure:                Colonoscopy Indications:              Screening for colorectal malignant neoplasm Medicines:                Monitored Anesthesia Care Procedure:                Pre-Anesthesia Assessment:                           - Prior to the procedure, a History and Physical                            was performed, and patient medications and                            allergies were reviewed. The patient's tolerance of                            previous anesthesia was also reviewed. The risks                            and benefits of the procedure and the sedation                            options and risks were discussed with the patient.                            All questions were answered, and informed consent                            was obtained. Prior Anticoagulants: The patient has                            taken no anticoagulant or antiplatelet agents. ASA                            Grade Assessment: III - A patient with severe                            systemic disease. After reviewing the risks and                            benefits, the patient was deemed in satisfactory                            condition to undergo the procedure.                           After obtaining informed consent, the colonoscope  was passed under direct vision. Throughout the                            procedure, the patient's blood pressure, pulse, and                            oxygen saturations were monitored continuously. The                            Olympus Scope SN: [id number] was introduced through                            the anus and advanced to the the cecum, identified                            by  appendiceal orifice and ileocecal valve. The                            colonoscopy was performed without difficulty. The                            patient tolerated the procedure well. The quality                            of the bowel preparation was adequate. The                            ileocecal valve, appendiceal orifice, and rectum                            were photographed. Scope In: 1:31:08 PM Scope Out: 1:48:47 PM Scope Withdrawal Time: 0 hours 14 minutes 11 seconds  Total Procedure Duration: 0 hours 17 minutes 39 seconds  Findings:                 The perianal and digital rectal examinations were                            normal.                           Three sessile polyps were found in the descending                            colon and ascending colon. The polyps were 3 to 6                            mm in size. These polyps were removed with a cold                            snare. Resection and retrieval were complete.                           A few small-mouthed diverticula were found in the  sigmoid colon.                           Non-bleeding internal hemorrhoids were found during                            retroflexion. The hemorrhoids were small. Complications:            No immediate complications. Estimated Blood Loss:     Estimated blood loss was minimal. Impression:               - Three 3 to 6 mm polyps in the descending colon                            and in the ascending colon, removed with a cold                            snare. Resected and retrieved.                           - Diverticulosis in the sigmoid colon.                           - Non-bleeding internal hemorrhoids. Recommendation:           - Patient has a contact number available for                            emergencies. The signs and symptoms of potential                            delayed complications were discussed with the                             patient. Return to normal activities tomorrow.                            Written discharge instructions were provided to the                            patient.                           - Resume previous diet.                           - Continue present medications.                           - Await pathology results.                           - Repeat colonoscopy in 3 - 5 years for                            surveillance based on pathology results. Napoleon Form, MD 01/09/2023 1:54:27 PM This report has been signed electronically.

## 2023-01-09 NOTE — Patient Instructions (Signed)
-  Handout on polyps, diverticulosis and hemorrhoids provided -await pathology results -repeat colonoscopy for surveillance recommended. Date to be determined when pathology result become available   -Continue present medications   YOU HAD AN ENDOSCOPIC PROCEDURE TODAY AT THE [facility]:   Refer to the procedure report that was given to you for any specific questions about what was found during the examination.  If the procedure report does not answer your questions, please call your gastroenterologist to clarify.  If you requested that your care partner not be given the details of your procedure findings, then the procedure report has been included in a sealed envelope for you to review at your convenience later.  YOU SHOULD EXPECT: Some feelings of bloating in the abdomen. Passage of more gas than usual.  Walking can help get rid of the air that was put into your GI tract during the procedure and reduce the bloating. If you had a lower endoscopy (such as a colonoscopy or flexible sigmoidoscopy) you may notice spotting of blood in your stool or on the toilet paper. If you underwent a bowel prep for your procedure, you may not have a normal bowel movement for a few days.  Please Note:  You might notice some irritation and congestion in your nose or some drainage.  This is from the oxygen used during your procedure.  There is no need for concern and it should clear up in a day or so.  SYMPTOMS TO REPORT IMMEDIATELY:  Following lower endoscopy (colonoscopy or flexible sigmoidoscopy):  Excessive amounts of blood in the stool  Significant tenderness or worsening of abdominal pains  Swelling of the abdomen that is new, acute  Fever of 100F or higher  For urgent or emergent issues, a gastroenterologist can be reached at any hour by calling [phone]. Do not use MyChart messaging for urgent concerns.    DIET:  We do recommend a small meal at first, but then you may proceed to your  regular diet.  Drink plenty of fluids but you should avoid alcoholic beverages for 24 hours.  ACTIVITY:  You should plan to take it easy for the rest of today and you should NOT DRIVE or use heavy machinery until tomorrow (because of the sedation medicines used during the test).    FOLLOW UP: Our staff will call the number listed on your records the next business day following your procedure.  We will call around 7:15- 8:00 am to check on you and address any questions or concerns that you may have regarding the information given to you following your procedure. If we do not reach you, we will leave a message.     If any biopsies were taken you will be contacted by phone or by letter within the next 1-3 weeks.  Please call us at [phone] if you have not heard about the biopsies in 3 weeks.    SIGNATURES/CONFIDENTIALITY: You and/or your care partner have signed paperwork which will be entered into your electronic medical record.  These signatures attest to the fact that that the information above on your After Visit Summary has been reviewed and is understood.  Full responsibility of the confidentiality of this discharge information lies with you and/or your care-partner.   

## 2023-01-10 ENCOUNTER — Telehealth: Payer: Self-pay | Admitting: *Deleted

## 2023-01-10 NOTE — Telephone Encounter (Signed)
  Follow up Call-     01/09/2023   12:42 PM  Call back number  Post procedure Call Back phone  # [phone]  Permission to leave phone message Yes     Patient questions:  Do you have a fever, pain , or abdominal swelling? No. Pain Score  0 *  Have you tolerated food without any problems? Yes.    Have you been able to return to your normal activities? Yes.    Do you have any questions about your discharge instructions: Diet   No. Medications  No. Follow up visit  No.  Do you have questions or concerns about your Care? No.  Actions: * If pain score is 4 or above: No action needed, pain <4.

## 2023-01-18 ENCOUNTER — Encounter: Payer: Self-pay | Admitting: Gastroenterology

## 2023-02-26 ENCOUNTER — Other Ambulatory Visit: Payer: Self-pay | Admitting: Family Medicine

## 2023-02-26 DIAGNOSIS — E1165 Type 2 diabetes mellitus with hyperglycemia: Secondary | ICD-10-CM

## 2023-06-04 ENCOUNTER — Ambulatory Visit: Payer: Managed Care, Other (non HMO) | Admitting: Family Medicine

## 2023-06-06 ENCOUNTER — Ambulatory Visit (INDEPENDENT_AMBULATORY_CARE_PROVIDER_SITE_OTHER): Payer: Managed Care, Other (non HMO) | Admitting: Family Medicine

## 2023-06-06 ENCOUNTER — Encounter: Payer: Self-pay | Admitting: Family Medicine

## 2023-06-06 VITALS — BP 136/98 | HR 68 | Temp 97.9°F | Resp 18 | Ht 65.5 in | Wt 269.0 lb

## 2023-06-06 DIAGNOSIS — E1169 Type 2 diabetes mellitus with other specified complication: Secondary | ICD-10-CM

## 2023-06-06 DIAGNOSIS — Z7985 Long-term (current) use of injectable non-insulin antidiabetic drugs: Secondary | ICD-10-CM

## 2023-06-06 DIAGNOSIS — Z7984 Long term (current) use of oral hypoglycemic drugs: Secondary | ICD-10-CM | POA: Diagnosis not present

## 2023-06-06 DIAGNOSIS — Z23 Encounter for immunization: Secondary | ICD-10-CM

## 2023-06-06 DIAGNOSIS — E1165 Type 2 diabetes mellitus with hyperglycemia: Secondary | ICD-10-CM | POA: Diagnosis not present

## 2023-06-06 DIAGNOSIS — E785 Hyperlipidemia, unspecified: Secondary | ICD-10-CM

## 2023-06-06 DIAGNOSIS — I10 Essential (primary) hypertension: Secondary | ICD-10-CM

## 2023-06-06 LAB — CBC WITH DIFFERENTIAL/PLATELET
Basophils Absolute: 0 10*3/uL (ref 0.0–0.1)
Basophils Relative: 0.5 % (ref 0.0–3.0)
Eosinophils Absolute: 0.2 10*3/uL (ref 0.0–0.7)
Eosinophils Relative: 3.2 % (ref 0.0–5.0)
HCT: 36.9 % (ref 36.0–46.0)
Hemoglobin: 11.9 g/dL — ABNORMAL LOW (ref 12.0–15.0)
Lymphocytes Relative: 23.8 % (ref 12.0–46.0)
Lymphs Abs: 1.5 10*3/uL (ref 0.7–4.0)
MCHC: 32.2 g/dL (ref 30.0–36.0)
MCV: 80.9 fL (ref 78.0–100.0)
Monocytes Absolute: 0.5 10*3/uL (ref 0.1–1.0)
Monocytes Relative: 8 % (ref 3.0–12.0)
Neutro Abs: 4 10*3/uL (ref 1.4–7.7)
Neutrophils Relative %: 64.5 % (ref 43.0–77.0)
Platelets: 312 10*3/uL (ref 150.0–400.0)
RBC: 4.56 Mil/uL (ref 3.87–5.11)
RDW: 15.4 % (ref 11.5–15.5)
WBC: 6.2 10*3/uL (ref 4.0–10.5)

## 2023-06-06 LAB — HEMOGLOBIN A1C: Hgb A1c MFr Bld: 6.6 % — ABNORMAL HIGH (ref 4.6–6.5)

## 2023-06-06 LAB — COMPREHENSIVE METABOLIC PANEL
ALT: 22 U/L (ref 0–35)
AST: 22 U/L (ref 0–37)
Albumin: 4.5 g/dL (ref 3.5–5.2)
Alkaline Phosphatase: 68 U/L (ref 39–117)
BUN: 11 mg/dL (ref 6–23)
CO2: 29 meq/L (ref 19–32)
Calcium: 9.3 mg/dL (ref 8.4–10.5)
Chloride: 102 meq/L (ref 96–112)
Creatinine, Ser: 0.72 mg/dL (ref 0.40–1.20)
GFR: 96.98 mL/min (ref >60.00)
Glucose, Bld: 89 mg/dL (ref 70–99)
Potassium: 4.2 meq/L (ref 3.5–5.1)
Sodium: 138 meq/L (ref 135–145)
Total Bilirubin: 1.3 mg/dL — ABNORMAL HIGH (ref 0.2–1.2)
Total Protein: 8 g/dL (ref 6.0–8.3)

## 2023-06-06 LAB — LIPID PANEL
Cholesterol: 127 mg/dL (ref 0–200)
HDL: 56 mg/dL (ref >39.00)
LDL Cholesterol: 52 mg/dL (ref 0–99)
NonHDL: 70.74
Total CHOL/HDL Ratio: 2
Triglycerides: 95 mg/dL (ref 0.0–149.0)
VLDL: 19 mg/dL (ref 0.0–40.0)

## 2023-06-06 MED ORDER — METFORMIN HCL ER 500 MG PO TB24: 500.0000 mg | ORAL_TABLET | Freq: Every day | ORAL | Status: DC

## 2023-06-06 MED ORDER — OZEMPIC (0.25 OR 0.5 MG/DOSE) 2 MG/3ML ~~LOC~~ SOPN
PEN_INJECTOR | SUBCUTANEOUS | 3 refills | Status: DC
Start: 2023-06-06 — End: 2023-10-15

## 2023-06-06 NOTE — Progress Notes (Signed)
Established Patient Office Visit  Subjective   Patient ID: Michele Tran, female    DOB: Jul 19, 1970  Age: 52 y.o. MRN: [id number]  Chief Complaint  Patient presents with   Diabetes   Hypertension   Hyperlipidemia   Follow-up    HPI Discussed the use of AI scribe software for clinical note transcription with the patient, who gave verbal consent to proceed.  History of Present Illness   The patient, with a history of diabetes, presents with concerns about her blood sugar levels. She reports that her blood sugars have been falling since her dose of Ozempic was increased from 0.25 to 0.5. However, she notes that her recent refill was back to the 0.25 dose. She experienced a significant drop in blood sugar to 59 on a recent Sunday night. She also reports a similar incident when she had to wait an hour before a colonoscopy because her blood sugar had fallen into the 50s.  The patient has lost weight, but she notes that she has started to regain it. She had a colonoscopy in July and has experienced low blood sugar since then.  In addition to her diabetes management, the patient is also caring for her mother who is under palliative care. She reports that this has been a stressful time for her, and she is not sleeping well due to the need to care for her mother throughout the night. She expresses concern about her mother's health, noting that she has a recurring UTI and has had periods of non-responsiveness.      Patient Active Problem List   Diagnosis Date Noted   Uncontrolled type 2 diabetes mellitus with hyperglycemia (HCC) 12/14/2019   Hyperlipidemia associated with type 2 diabetes mellitus (HCC) 12/14/2019   S/P partial thyroidectomy 11/27/2019   Preventative health care 09/17/2016   Severe obesity (BMI >= 40) (HCC) 03/26/2013   Angioedema 04/18/2011   Eczema 10/09/2010   MORBID OBESITY 04/04/2010   POLYCYSTIC OVARIES 07/14/2008   Hyperlipidemia LDL goal <100 07/14/2008    Essential hypertension 07/14/2008   LIVER FUNCTION TESTS, ABNORMAL, HX OF 07/14/2008   HIRSUTISM 06/25/2008   ABDOMINAL BLOATING 06/25/2008   PAP SMEAR, ABNORMAL 06/25/2008   Past Medical History:  Diagnosis Date   Abdominal bloating    Anemia    Asthma    Diabetes mellitus without complication (HCC)    type 2   GERD (gastroesophageal reflux disease)    Hirsutism    History of COVID-19    06/2019   Hyperlipidemia    Hypertension    Obesity, morbid (HCC)    Polycystic ovaries    Past Surgical History:  Procedure Laterality Date   THYROIDECTOMY Right 11/27/2019   Procedure: RIGHT HEMI THYROIDECTOMY;  Surgeon: Newman Pies, MD;  Location: [facility];  Service: ENT;  Laterality: Right;   UTERINE polyps  05-31-10   D &C  [facility]   Social History   Tobacco Use   Smoking status: Never   Smokeless tobacco: Never  Vaping Use   Vaping status: Never Used  Substance Use Topics   Alcohol use: No   Drug use: No   Social History   Socioeconomic History   Marital status: Single    Spouse name: Not on file   Number of children: Not on file   Years of education: Not on file   Highest education level: Not on file  Occupational History   Occupation: levalor  Tobacco Use   Smoking status: Never   Smokeless  tobacco: Never  Vaping Use   Vaping status: Never Used  Substance and Sexual Activity   Alcohol use: No   Drug use: No   Sexual activity: Yes    Partners: Male  Other Topics Concern   Not on file  Social History Narrative   Exercise-- walking    Social Drivers of Health   Financial Resource Strain: Not on file  Food Insecurity: Not on file  Transportation Needs: Not on file  Physical Activity: Not on file  Stress: Not on file  Social Connections: Not on file  Intimate Partner Violence: Not on file   Family Status  Relation Name Status   Mother  Alive   Father  Deceased   Mat Aunt  (Not Specified)   Emelda Brothers  (Not Specified)   Emelda Brothers  (Not  Specified)   Oneal Grout  Deceased at age 42   Other  (Not Specified)   Other  (Not Specified)   Other  (Not Specified)   Other  (Not Specified)   Neg Hx  (Not Specified)  No partnership data on file   Family History  Problem Relation Age of Onset   Stroke Mother 10       1st degree relative    Esophageal cancer Father    Cancer Father 72       advanced head , neck and throat ca   Pancreatic cancer Maternal Aunt    Lung cancer Paternal Aunt    Cancer Paternal Aunt        breast, lung   Breast cancer Paternal Aunt    Coronary artery disease Paternal Uncle        late 27s   Leukemia Paternal Uncle    Arthritis Other    Diabetes Other        1st degree relative   Hyperlipidemia Other    Hypertension Other    Colon cancer Neg Hx    Colon polyps Neg Hx    Rectal cancer Neg Hx    Stomach cancer Neg Hx    Allergies  Allergen Reactions   Ace Inhibitors Swelling    Swelling   Cherry    Codeine Nausea And Vomiting   Fluviral [Influenza Virus Vaccine] Other (See Comments)    Asthma exacerabation   Lisinopril Swelling      Review of Systems  Constitutional:  Negative for chills, fever and malaise/fatigue.  HENT:  Negative for congestion and hearing loss.   Eyes:  Negative for blurred vision and discharge.  Respiratory:  Negative for cough, sputum production and shortness of breath.   Cardiovascular:  Negative for chest pain, palpitations and leg swelling.  Gastrointestinal:  Negative for abdominal pain, blood in stool, constipation, diarrhea, heartburn, nausea and vomiting.  Genitourinary:  Negative for dysuria, frequency, hematuria and urgency.  Musculoskeletal:  Negative for back pain, falls and myalgias.  Skin:  Negative for rash.  Neurological:  Negative for dizziness, sensory change, loss of consciousness, weakness and headaches.  Endo/Heme/Allergies:  Negative for environmental allergies. Does not bruise/bleed easily.  Psychiatric/Behavioral:  Negative for depression  and suicidal ideas. The patient is not nervous/anxious and does not have insomnia.       Objective:     BP (!) 136/98 (BP Location: Left Arm, Patient Position: Sitting, Cuff Size: Large)   Pulse 68   Temp 97.9 F (36.6 C) (Oral)   Resp 18   Ht 5' 5.5" (1.664 m)   Wt 269 lb (122 kg)   SpO2  99%   BMI 44.08 kg/m  BP Readings from Last 3 Encounters:  06/06/23 (!) 136/98  01/09/23 (!) 143/83  06/07/22 130/80   Wt Readings from Last 3 Encounters:  06/06/23 269 lb (122 kg)  01/09/23 262 lb (118.8 kg)  12/03/22 262 lb (118.8 kg)   SpO2 Readings from Last 3 Encounters:  06/06/23 99%  01/09/23 100%  06/07/22 99%      Physical Exam Vitals and nursing note reviewed.  Constitutional:      General: She is not in acute distress.    Appearance: Normal appearance. She is well-developed.  HENT:     Head: Normocephalic and atraumatic.     Right Ear: Tympanic membrane, ear canal and external ear normal. There is no impacted cerumen.     Left Ear: Tympanic membrane, ear canal and external ear normal. There is no impacted cerumen.     Nose: Nose normal.     Mouth/Throat:     Mouth: Mucous membranes are moist.     Pharynx: Oropharynx is clear. No oropharyngeal exudate or posterior oropharyngeal erythema.  Eyes:     General: No scleral icterus.       Right eye: No discharge.        Left eye: No discharge.     Conjunctiva/sclera: Conjunctivae normal.     Pupils: Pupils are equal, round, and reactive to light.  Neck:     Thyroid: No thyromegaly or thyroid tenderness.     Vascular: No JVD.  Cardiovascular:     Rate and Rhythm: Normal rate and regular rhythm.     Heart sounds: Normal heart sounds. No murmur heard. Pulmonary:     Effort: Pulmonary effort is normal. No respiratory distress.     Breath sounds: Normal breath sounds.  Abdominal:     General: Bowel sounds are normal. There is no distension.     Palpations: Abdomen is soft. There is no mass.     Tenderness: There is no  abdominal tenderness. There is no guarding or rebound.  Genitourinary:    Vagina: Normal.  Musculoskeletal:        General: Normal range of motion.     Cervical back: Normal range of motion and neck supple.     Right lower leg: No edema.     Left lower leg: No edema.  Lymphadenopathy:     Cervical: No cervical adenopathy.  Skin:    General: Skin is warm and dry.     Findings: No erythema or rash.  Neurological:     Mental Status: She is alert and oriented to person, place, and time.     Cranial Nerves: No cranial nerve deficit.     Deep Tendon Reflexes: Reflexes are normal and symmetric.  Psychiatric:        Mood and Affect: Mood normal.        Behavior: Behavior normal.        Thought Content: Thought content normal.        Judgment: Judgment normal.      No results found for any visits on 06/06/23.  Last CBC Lab Results  Component Value Date   WBC 8.0 06/07/2022   HGB 11.7 (L) 06/07/2022   HCT 36.8 06/07/2022   MCV 77.8 (L) 06/07/2022   MCH 25.5 (L) 04/12/2011   RDW 16.4 (H) 06/07/2022   PLT 354.0 06/07/2022   Last metabolic panel Lab Results  Component Value Date   GLUCOSE 112 (H) 06/07/2022   NA 139 06/07/2022  K 4.1 06/07/2022   CL 100 06/07/2022   CO2 28 06/07/2022   BUN 11 06/07/2022   CREATININE 0.70 06/07/2022   GFR 101.02 06/07/2022   CALCIUM 9.6 06/07/2022   PROT 8.2 06/07/2022   ALBUMIN 4.6 06/07/2022   LABGLOB 3.5 07/14/2019   AGRATIO 1.3 07/14/2019   BILITOT 1.2 06/07/2022   ALKPHOS 70 06/07/2022   AST 17 06/07/2022   ALT 16 06/07/2022   ANIONGAP 10 11/24/2019   Last lipids Lab Results  Component Value Date   CHOL 139 06/07/2022   HDL 55.40 06/07/2022   LDLCALC 61 06/07/2022   LDLDIRECT 149.6 03/26/2013   TRIG 111.0 06/07/2022   CHOLHDL 3 06/07/2022   Last hemoglobin A1c Lab Results  Component Value Date   HGBA1C 7.3 (H) 06/07/2022   Last thyroid functions Lab Results  Component Value Date   TSH 1.67 08/01/2020   T4TOTAL  6.0 12/14/2019   Last vitamin D No results found for: "25OHVITD2", "25OHVITD3", "VD25OH"    The 10-year ASCVD risk score (Arnett DK, et al., 2019) is: 7.8%    Assessment & Plan:   Problem List Items Addressed This Visit       Unprioritized   Uncontrolled type 2 diabetes mellitus with hyperglycemia (HCC) - Primary   Relevant Medications   Semaglutide,0.25 or 0.5MG /DOS, (OZEMPIC, 0.25 OR 0.5 MG/DOSE,) 2 MG/3ML SOPN   metFORMIN (GLUCOPHAGE-XR) 500 MG 24 hr tablet   Other Relevant Orders   Comprehensive metabolic panel   Hemoglobin A1c   Microalbumin / creatinine urine ratio   Hyperlipidemia associated with type 2 diabetes mellitus (HCC)   Encourage heart healthy diet such as MIND or DASH diet, increase exercise, avoid trans fats, simple carbohydrates and processed foods, consider a krill or fish or flaxseed oil cap daily.        Relevant Medications   Semaglutide,0.25 or 0.5MG /DOS, (OZEMPIC, 0.25 OR 0.5 MG/DOSE,) 2 MG/3ML SOPN   metFORMIN (GLUCOPHAGE-XR) 500 MG 24 hr tablet   Other Relevant Orders   Lipid panel   Comprehensive metabolic panel   Essential hypertension   Well controlled, no changes to meds. Encouraged heart healthy diet such as the DASH diet and exercise as tolerated.        Relevant Orders   Lipid panel   CBC with Differential/Platelet   Comprehensive metabolic panel   Other Visit Diagnoses       Need for shingles vaccine       Relevant Orders   Zoster Recombinant (Shingrix ) (Completed)     Assessment and Plan    Hypoglycemia   She experiences recurrent hypoglycemia episodes, especially on weekends, with blood sugar levels dropping to as low as 59 mg/dL, likely due to medication dosing and dietary intake. She is currently on Ozempic and Metformin. We discussed reducing the Metformin dosage to mitigate hypoglycemia while retaining the weight loss benefits of Ozempic. We will decrease the Metformin dosage, closely monitor her blood sugar levels, and  adjust the Ozempic dosage to 0.5 mg as previously prescribed.  Type 2 Diabetes Mellitus   She is being treated with Ozempic and Metformin for Type 2 Diabetes Mellitus. She has recently gained weight, from 256 lbs to 269 lbs, and her blood sugar levels have been unstable. We discussed maintaining the current Ozempic dosage for weight management and adjusting the Metformin dosage to stabilize blood sugar levels. We will continue Ozempic at 0.5 mg, decrease the Metformin dosage, and monitor both weight and blood sugar levels.  General Health Maintenance  She has not had a recent eye exam for diabetic retinopathy screening. We discussed the importance of regular screenings and vaccinations. She agreed to receive the shingles vaccine today and plan for the second dose. We will schedule an eye exam for retinal screening, administer the shingles vaccine today, and schedule the second shingles vaccine dose.  Goals of Care   Her mother is under palliative care with [facility], and we discussed transitioning to hospice care due to recurrent UTIs and fluctuating mental status. She, being the primary caregiver, is experiencing stress and sleep disturbances. We discussed evaluating her mother's wishes and quality of life considerations. We will discuss hospice care options with [facility], evaluate her mother's wishes and quality of life considerations, and consider additional support for caregiving responsibilities.  Follow-up   We will follow up in six months or sooner if needed.        No follow-ups on file.    Donato Schultz, DO

## 2023-06-06 NOTE — Assessment & Plan Note (Signed)
Well controlled, no changes to meds. Encouraged heart healthy diet such as the DASH diet and exercise as tolerated.  °

## 2023-06-06 NOTE — Assessment & Plan Note (Signed)
Hgba1c to be checked , minimize simple carbs. Increase exercise as tolerated. Continue current meds

## 2023-06-06 NOTE — Assessment & Plan Note (Signed)
Encourage heart healthy diet such as MIND or DASH diet, increase exercise, avoid trans fats, simple carbohydrates and processed foods, consider a krill or fish or flaxseed oil cap daily.  °

## 2023-06-07 LAB — MICROALBUMIN / CREATININE URINE RATIO
Creatinine,U: 103.3 mg/dL
Microalb Creat Ratio: 0.7 mg/g (ref 0.0–30.0)
Microalb, Ur: <0.7 mg/dL (ref 0.0–1.9)

## 2023-06-21 ENCOUNTER — Other Ambulatory Visit: Payer: Self-pay | Admitting: Family Medicine

## 2023-06-21 DIAGNOSIS — E1165 Type 2 diabetes mellitus with hyperglycemia: Secondary | ICD-10-CM

## 2023-06-21 DIAGNOSIS — I10 Essential (primary) hypertension: Secondary | ICD-10-CM

## 2023-06-21 DIAGNOSIS — E1169 Type 2 diabetes mellitus with other specified complication: Secondary | ICD-10-CM

## 2023-08-29 ENCOUNTER — Other Ambulatory Visit: Payer: Self-pay | Admitting: Family Medicine

## 2023-08-29 DIAGNOSIS — I10 Essential (primary) hypertension: Secondary | ICD-10-CM

## 2023-09-05 ENCOUNTER — Other Ambulatory Visit (HOSPITAL_BASED_OUTPATIENT_CLINIC_OR_DEPARTMENT_OTHER): Payer: Self-pay | Admitting: Family Medicine

## 2023-09-05 DIAGNOSIS — Z1231 Encounter for screening mammogram for malignant neoplasm of breast: Secondary | ICD-10-CM

## 2023-09-30 ENCOUNTER — Inpatient Hospital Stay (HOSPITAL_BASED_OUTPATIENT_CLINIC_OR_DEPARTMENT_OTHER): Admission: RE | Admit: 2023-09-30 | Source: Ambulatory Visit

## 2023-10-03 ENCOUNTER — Ambulatory Visit (HOSPITAL_BASED_OUTPATIENT_CLINIC_OR_DEPARTMENT_OTHER)
Admission: RE | Admit: 2023-10-03 | Discharge: 2023-10-03 | Disposition: A | Discharge status: Home / Self Care | Source: Ambulatory Visit | Attending: Family Medicine | Admitting: Family Medicine

## 2023-10-03 ENCOUNTER — Encounter (HOSPITAL_BASED_OUTPATIENT_CLINIC_OR_DEPARTMENT_OTHER): Payer: Self-pay

## 2023-10-03 DIAGNOSIS — Z1231 Encounter for screening mammogram for malignant neoplasm of breast: Secondary | ICD-10-CM | POA: Insufficient documentation

## 2023-10-15 ENCOUNTER — Encounter: Payer: Self-pay | Admitting: Family Medicine

## 2023-10-15 ENCOUNTER — Other Ambulatory Visit: Payer: Self-pay | Admitting: Family Medicine

## 2023-10-15 DIAGNOSIS — E1165 Type 2 diabetes mellitus with hyperglycemia: Secondary | ICD-10-CM

## 2023-10-15 MED ORDER — SEMAGLUTIDE (1 MG/DOSE) 4 MG/3ML ~~LOC~~ SOPN: 1.0000 mg | PEN_INJECTOR | SUBCUTANEOUS | 3 refills | Status: DC

## 2023-11-05 NOTE — Telephone Encounter (Signed)
 Can we try a new PA for the increased dose?

## 2023-11-08 ENCOUNTER — Telehealth: Payer: Self-pay

## 2023-11-08 ENCOUNTER — Other Ambulatory Visit (HOSPITAL_BASED_OUTPATIENT_CLINIC_OR_DEPARTMENT_OTHER): Payer: Self-pay

## 2023-11-08 NOTE — Telephone Encounter (Signed)
 Pharmacy Patient Advocate Encounter  Received notification from [employer] that Prior Authorization for Ozempic  4mg /99ml has been APPROVED from 10/09/23 to 11/07/24. Ran test claim, Copay is $958.28. This test claim was processed through [facility]- copay amounts may vary at other pharmacies due to pharmacy/plan contracts, or as the patient moves through the different stages of their insurance plan.   PA #/Case ID/Reference #: [id number]  *Copay is due a deductible.

## 2023-11-08 NOTE — Telephone Encounter (Signed)
 Pharmacy Patient Advocate Encounter   Received notification from Patient Advice Request messages that prior authorization for Ozempic  4mg /84ml is required/requested.   Insurance verification completed.   The patient is insured through [employer] .   Per test claim: PA required; PA submitted to above mentioned insurance via CoverMyMeds Key/confirmation #/EOC  [id number] Status is pending

## 2023-12-18 ENCOUNTER — Other Ambulatory Visit: Payer: Self-pay | Admitting: Family Medicine

## 2023-12-18 DIAGNOSIS — I10 Essential (primary) hypertension: Secondary | ICD-10-CM

## 2023-12-18 DIAGNOSIS — E1169 Type 2 diabetes mellitus with other specified complication: Secondary | ICD-10-CM

## 2023-12-18 DIAGNOSIS — E1165 Type 2 diabetes mellitus with hyperglycemia: Secondary | ICD-10-CM

## 2024-02-24 ENCOUNTER — Encounter: Payer: Self-pay | Admitting: Family Medicine

## 2024-02-24 DIAGNOSIS — L309 Dermatitis, unspecified: Secondary | ICD-10-CM

## 2024-02-24 MED ORDER — FLUOCINONIDE 0.05 % EX CREA: TOPICAL_CREAM | Freq: Two times a day (BID) | CUTANEOUS | 0 refills | Status: DC

## 2024-02-25 ENCOUNTER — Other Ambulatory Visit: Payer: Self-pay | Admitting: Family Medicine

## 2024-02-25 DIAGNOSIS — I10 Essential (primary) hypertension: Secondary | ICD-10-CM

## 2024-03-17 ENCOUNTER — Other Ambulatory Visit: Payer: Self-pay | Admitting: Family Medicine

## 2024-03-17 DIAGNOSIS — I10 Essential (primary) hypertension: Secondary | ICD-10-CM

## 2024-03-17 DIAGNOSIS — E1165 Type 2 diabetes mellitus with hyperglycemia: Secondary | ICD-10-CM

## 2024-03-17 DIAGNOSIS — E1169 Type 2 diabetes mellitus with other specified complication: Secondary | ICD-10-CM

## 2024-04-08 ENCOUNTER — Other Ambulatory Visit: Payer: Self-pay | Admitting: Family Medicine

## 2024-04-08 ENCOUNTER — Telehealth: Payer: Self-pay

## 2024-04-08 DIAGNOSIS — E1165 Type 2 diabetes mellitus with hyperglycemia: Secondary | ICD-10-CM

## 2024-04-08 DIAGNOSIS — I10 Essential (primary) hypertension: Secondary | ICD-10-CM

## 2024-04-08 DIAGNOSIS — E1169 Type 2 diabetes mellitus with other specified complication: Secondary | ICD-10-CM

## 2024-04-08 DIAGNOSIS — Z0000 Encounter for general adult medical examination without abnormal findings: Secondary | ICD-10-CM

## 2024-04-08 NOTE — Telephone Encounter (Signed)
 Copied from CRM [id number]. Topic: Clinical - Request for Lab/Test Order >> Apr 08, 2024 12:43 PM Viola F wrote: Patient scheduled physical for 05/28/24 and would like lab orders put in so she can have her labs done at [facility]. Please call her when orders are in.

## 2024-04-08 NOTE — Addendum Note (Signed)
 Addended by: Yamen Castrogiovanni D on: 04/08/2024 03:48 PM   Modules accepted: Orders

## 2024-04-08 NOTE — Telephone Encounter (Signed)
 Spoke w/ Pt- informed that orders have been placed for [facility]. Not to go until several days prior to her visit in December.

## 2024-05-27 ENCOUNTER — Other Ambulatory Visit (INDEPENDENT_AMBULATORY_CARE_PROVIDER_SITE_OTHER)

## 2024-05-27 DIAGNOSIS — I10 Essential (primary) hypertension: Secondary | ICD-10-CM

## 2024-05-27 DIAGNOSIS — E1165 Type 2 diabetes mellitus with hyperglycemia: Secondary | ICD-10-CM | POA: Diagnosis not present

## 2024-05-27 DIAGNOSIS — E1169 Type 2 diabetes mellitus with other specified complication: Secondary | ICD-10-CM

## 2024-05-27 DIAGNOSIS — E785 Hyperlipidemia, unspecified: Secondary | ICD-10-CM

## 2024-05-27 DIAGNOSIS — Z0000 Encounter for general adult medical examination without abnormal findings: Secondary | ICD-10-CM | POA: Diagnosis not present

## 2024-05-27 LAB — LIPID PANEL
Cholesterol: 123 mg/dL (ref 0–200)
HDL: 46.8 mg/dL (ref >39.00)
LDL Cholesterol: 54 mg/dL (ref 0–99)
NonHDL: 76.42
Total CHOL/HDL Ratio: 3
Triglycerides: 110 mg/dL (ref 0.0–149.0)
VLDL: 22 mg/dL (ref 0.0–40.0)

## 2024-05-27 LAB — CBC WITH DIFFERENTIAL/PLATELET
Basophils Absolute: 0 K/uL (ref 0.0–0.1)
Basophils Relative: 0.6 % (ref 0.0–3.0)
Eosinophils Absolute: 0.2 K/uL (ref 0.0–0.7)
Eosinophils Relative: 2.4 % (ref 0.0–5.0)
HCT: 34.9 % — ABNORMAL LOW (ref 36.0–46.0)
Hemoglobin: 11.4 g/dL — ABNORMAL LOW (ref 12.0–15.0)
Lymphocytes Relative: 23.9 % (ref 12.0–46.0)
Lymphs Abs: 1.7 K/uL (ref 0.7–4.0)
MCHC: 32.7 g/dL (ref 30.0–36.0)
MCV: 80.1 fl (ref 78.0–100.0)
Monocytes Absolute: 0.5 K/uL (ref 0.1–1.0)
Monocytes Relative: 6.5 % (ref 3.0–12.0)
Neutro Abs: 4.8 K/uL (ref 1.4–7.7)
Neutrophils Relative %: 66.6 % (ref 43.0–77.0)
Platelets: 312 K/uL (ref 150.0–400.0)
RBC: 4.36 Mil/uL (ref 3.87–5.11)
RDW: 16.2 % — ABNORMAL HIGH (ref 11.5–15.5)
WBC: 7.2 K/uL (ref 4.0–10.5)

## 2024-05-27 LAB — COMPREHENSIVE METABOLIC PANEL WITH GFR
ALT: 16 U/L (ref 0–35)
AST: 16 U/L (ref 0–37)
Albumin: 4.5 g/dL (ref 3.5–5.2)
Alkaline Phosphatase: 58 U/L (ref 39–117)
BUN: 9 mg/dL (ref 6–23)
CO2: 28 meq/L (ref 19–32)
Calcium: 9.1 mg/dL (ref 8.4–10.5)
Chloride: 103 meq/L (ref 96–112)
Creatinine, Ser: 0.67 mg/dL (ref 0.40–1.20)
GFR: 100.69 mL/min (ref >60.00)
Glucose, Bld: 98 mg/dL (ref 70–99)
Potassium: 3.8 meq/L (ref 3.5–5.1)
Sodium: 139 meq/L (ref 135–145)
Total Bilirubin: 1.1 mg/dL (ref 0.2–1.2)
Total Protein: 7.6 g/dL (ref 6.0–8.3)

## 2024-05-27 LAB — HEMOGLOBIN A1C: Hgb A1c MFr Bld: 7.2 % — ABNORMAL HIGH (ref 4.6–6.5)

## 2024-05-27 LAB — TSH: TSH: 1.15 u[IU]/mL (ref 0.35–5.50)

## 2024-05-28 ENCOUNTER — Ambulatory Visit: Admitting: Family Medicine

## 2024-05-28 ENCOUNTER — Encounter: Payer: Self-pay | Admitting: Family Medicine

## 2024-05-28 ENCOUNTER — Ambulatory Visit: Payer: Self-pay | Admitting: Family Medicine

## 2024-05-28 DIAGNOSIS — J0110 Acute frontal sinusitis, unspecified: Secondary | ICD-10-CM

## 2024-05-28 DIAGNOSIS — E1165 Type 2 diabetes mellitus with hyperglycemia: Secondary | ICD-10-CM

## 2024-05-28 DIAGNOSIS — E1169 Type 2 diabetes mellitus with other specified complication: Secondary | ICD-10-CM

## 2024-05-28 DIAGNOSIS — Z0000 Encounter for general adult medical examination without abnormal findings: Secondary | ICD-10-CM

## 2024-05-28 DIAGNOSIS — I10 Essential (primary) hypertension: Secondary | ICD-10-CM

## 2024-05-28 DIAGNOSIS — L309 Dermatitis, unspecified: Secondary | ICD-10-CM

## 2024-05-28 MED ORDER — ATORVASTATIN CALCIUM 40 MG PO TABS: 40.0000 mg | ORAL_TABLET | Freq: Every day | ORAL | 1 refills | Status: DC

## 2024-05-28 MED ORDER — FLUOCINONIDE 0.05 % EX CREA: TOPICAL_CREAM | Freq: Two times a day (BID) | CUTANEOUS | 4 refills | Status: DC

## 2024-05-28 MED ORDER — CARVEDILOL 25 MG PO TABS: 25.0000 mg | ORAL_TABLET | Freq: Two times a day (BID) | ORAL | 3 refills | Status: DC

## 2024-05-28 MED ORDER — FUROSEMIDE 20 MG PO TABS: 20.0000 mg | ORAL_TABLET | Freq: Every day | ORAL | 1 refills | Status: AC

## 2024-05-28 MED ORDER — METFORMIN HCL ER 500 MG PO TB24: ORAL_TABLET | ORAL | 3 refills | Status: DC

## 2024-05-28 MED ORDER — AMLODIPINE BESYLATE 5 MG PO TABS: 5.0000 mg | ORAL_TABLET | Freq: Every day | ORAL | 1 refills | Status: DC

## 2024-05-28 MED ORDER — AMLODIPINE BESYLATE 5 MG PO TABS: 5.0000 mg | ORAL_TABLET | Freq: Every day | ORAL | 1 refills | Status: AC

## 2024-05-28 MED ORDER — FLUTICASONE PROPIONATE 50 MCG/ACT NA SUSP: 2.0000 | Freq: Every day | NASAL | 3 refills | Status: AC | PRN

## 2024-05-28 NOTE — Progress Notes (Signed)
 +  Subjective:    Patient ID: Michele Tran, female    DOB: 06-16-1971, 53 y.o.   MRN: [id number]  Chief Complaint  Patient presents with   Annual Exam    Pt states fasting     HPI Patient is in today for cpe.  Discussed the use of AI scribe software for clinical note transcription with the patient, who gave verbal consent to proceed.  History of Present Illness Michele Tran is a 53 year old female with type 2 diabetes who presents for medication refill and management of her diabetes.  She has a history of type 2 diabetes with a recent increase in A1c to 7.2. Previously, she was on Ozempic , which effectively reduced her A1c to 5, but she discontinued it over a year ago due to insurance coverage issues. Her current blood sugar levels range from 150 to 160, which she attributes to stress. She is currently taking metformin  once daily.  Her medication regimen includes atorvastatin , amlodipine , furosemide , carvedilol , and metformin . She also uses Flonase  and a topical cream with a concentration of 0.5%.  In July, she experienced severe pain from her foot to her head after taking off her pants, resulting in a sprained inner ankle. She was in a brace for four weeks and then a boot for ten weeks.  She reports stable anemia with a recent hemoglobin of 11.4.  She is the primary caregiver for her mother, who had a severe episode in July where she was unresponsive for seven days due to a UTI, but did not become septic. Her mother is currently being treated at home for a UTI with a low dose of macrodantin, which has helped with aggression and confusion.    Past Medical History:  Diagnosis Date   Abdominal bloating    Anemia    Asthma    Diabetes mellitus without complication (HCC)    type 2   GERD (gastroesophageal reflux disease)    Hirsutism    History of COVID-19    06/2019   Hyperlipidemia    Hypertension    Obesity, morbid (HCC)    Polycystic ovaries     Past Surgical  History:  Procedure Laterality Date   THYROIDECTOMY Right 11/27/2019   Procedure: RIGHT HEMI THYROIDECTOMY;  Surgeon: Karis Clunes, MD;  Location: [facility];  Service: ENT;  Laterality: Right;   UTERINE polyps  05-31-10   [facility]    Family History  Problem Relation Age of Onset   Stroke Mother 71       1st degree relative    Esophageal cancer Father    Cancer Father 66       advanced head , neck and throat ca   Pancreatic cancer Maternal Aunt    Lung cancer Paternal Aunt    Cancer Paternal Aunt        breast, lung   Breast cancer Paternal Aunt    Coronary artery disease Paternal Uncle        late 22s   Leukemia Paternal Uncle    Arthritis Other    Diabetes Other        1st degree relative   Hyperlipidemia Other    Hypertension Other    Renal cancer Cousin    Colon cancer Neg Hx    Colon polyps Neg Hx    Rectal cancer Neg Hx    Stomach cancer Neg Hx     Social History   Socioeconomic History   Marital  status: Single    Spouse name: Not on file   Number of children: Not on file   Years of education: Not on file   Highest education level: Not on file  Occupational History   Occupation: levalor  Tobacco Use   Smoking status: Never   Smokeless tobacco: Never  Vaping Use   Vaping status: Never Used  Substance and Sexual Activity   Alcohol use: No   Drug use: No   Sexual activity: Yes    Partners: Male  Other Topics Concern   Not on file  Social History Narrative   Exercise-- walking    Social Drivers of Health   Tobacco Use: Low Risk (05/28/2024)   Patient History    Smoking Tobacco Use: Never    Smokeless Tobacco Use: Never    Passive Exposure: Not on file  Financial Resource Strain: Not on file  Food Insecurity: Not on file  Transportation Needs: Not on file  Physical Activity: Not on file  Stress: Not on file  Social Connections: Not on file  Intimate Partner Violence: Not on file  Depression (PHQ2-9): Low Risk (05/28/2024)    Depression (PHQ2-9)    PHQ-2 Score: 0  Alcohol Screen: Not on file  Housing: Not on file  Utilities: Not on file  Health Literacy: Not on file    Outpatient Medications Prior to Visit  Medication Sig Dispense Refill   Blood Glucose Monitoring Suppl (FREESTYLE LITE) w/Device KIT 1 kit by Does not apply route 3 (three) times daily. 1 kit 0   Blood Glucose Monitoring Suppl (ONETOUCH VERIO) w/Device KIT Use to check blood sugars. 1 kit 0   famotidine  (PEPCID ) 20 MG tablet Take 1 tablet (20 mg total) by mouth 2 (two) times daily. 180 tablet 3   glucose blood (ONETOUCH VERIO) test strip 1 each by Other route as needed for other. Use as instructed     Lancets (ONETOUCH DELICA PLUS LANCET33G) MISC 1 each by Does not apply route in the morning, at noon, and at bedtime. Cancell any other prescriptions for freestyle lancets. Patient has a one touch meter. Thanks. 100 each 1   Multiple Vitamin (MULTIVITAMIN) tablet Take 1 tablet by mouth daily.     Prenatal Vit-Fe Fumarate-FA (MULTIVITAMIN-PRENATAL) 27-0.8 MG TABS tablet Take 1 tablet by mouth daily at 12 noon.     amLODipine  (NORVASC ) 5 MG tablet TAKE 1 TABLET DAILY (NEED APPOINTMENT) 30 tablet 0   atorvastatin  (LIPITOR) 40 MG tablet TAKE 1 TABLET DAILY (NEED APPOINTMENT) 30 tablet 0   carvedilol  (COREG ) 25 MG tablet TAKE 1 TABLET TWICE A DAY WITH MEALS 180 tablet 3   fluocinonide  cream (LIDEX ) 0.05 % Apply topically 2 (two) times daily. 15 g 0   fluticasone  (FLONASE ) 50 MCG/ACT nasal spray Place 2 sprays into both nostrils daily as needed for allergies or rhinitis. 48 g 3   furosemide  (LASIX ) 20 MG tablet TAKE 1 TABLET DAILY (NEED APPOINTMENT) 30 tablet 0   metFORMIN  (GLUCOPHAGE -XR) 500 MG 24 hr tablet TAKE 2 TABLETS DAILY (NEED APPOINTMENT) 60 tablet 0   Semaglutide , 1 MG/DOSE, 4 MG/3ML SOPN Inject 1 mg as directed once a week. 3 mL 3   ammonium lactate  (LAC-HYDRIN ) 12 % cream Apply topically as needed for dry skin. (Patient not taking: Reported on  05/28/2024) 385 g 0   No facility-administered medications prior to visit.    Allergies[1]  Review of Systems  Constitutional:  Negative for fever and malaise/fatigue.  HENT:  Negative for congestion.  Eyes:  Negative for blurred vision.  Respiratory:  Negative for shortness of breath.   Cardiovascular:  Negative for chest pain, palpitations and leg swelling.  Gastrointestinal:  Negative for abdominal pain, blood in stool and nausea.  Genitourinary:  Negative for dysuria and frequency.  Musculoskeletal:  Negative for falls.  Skin:  Negative for rash.  Neurological:  Negative for dizziness, loss of consciousness and headaches.  Endo/Heme/Allergies:  Negative for environmental allergies.  Psychiatric/Behavioral:  Negative for depression. The patient is not nervous/anxious.        Objective:    Physical Exam Vitals and nursing note reviewed.  Constitutional:      General: She is not in acute distress.    Appearance: Normal appearance. She is well-developed.  HENT:     Head: Normocephalic and atraumatic.     Right Ear: Tympanic membrane, ear canal and external ear normal. There is no impacted cerumen.     Left Ear: Tympanic membrane, ear canal and external ear normal. There is no impacted cerumen.     Nose: Nose normal.     Mouth/Throat:     Mouth: Mucous membranes are moist.     Pharynx: Oropharynx is clear. No oropharyngeal exudate or posterior oropharyngeal erythema.  Eyes:     General: No scleral icterus.       Right eye: No discharge.        Left eye: No discharge.     Conjunctiva/sclera: Conjunctivae normal.     Pupils: Pupils are equal, round, and reactive to light.  Neck:     Thyroid : No thyromegaly or thyroid  tenderness.     Vascular: No JVD.  Cardiovascular:     Rate and Rhythm: Normal rate and regular rhythm.     Heart sounds: Normal heart sounds. No murmur heard. Pulmonary:     Effort: Pulmonary effort is normal. No respiratory distress.     Breath sounds:  Normal breath sounds.  Abdominal:     General: Bowel sounds are normal. There is no distension.     Palpations: Abdomen is soft. There is no mass.     Tenderness: There is no abdominal tenderness. There is no guarding or rebound.  Genitourinary:    Vagina: Normal.  Musculoskeletal:        General: Normal range of motion.     Cervical back: Normal range of motion and neck supple.     Right lower leg: No edema.     Left lower leg: No edema.  Lymphadenopathy:     Cervical: No cervical adenopathy.  Skin:    General: Skin is warm and dry.     Findings: No erythema or rash.  Neurological:     Mental Status: She is alert and oriented to person, place, and time.     Cranial Nerves: No cranial nerve deficit.     Deep Tendon Reflexes: Reflexes are normal and symmetric.  Psychiatric:        Mood and Affect: Mood normal.        Behavior: Behavior normal.        Thought Content: Thought content normal.        Judgment: Judgment normal.     BP 122/70 (BP Location: Left Arm, Patient Position: Sitting, Cuff Size: Large)   Pulse 68   Temp 98.7 F (37.1 C) (Oral)   Resp 18   Ht 5' 5.5 (1.664 m)   Wt 280 lb 3.2 oz (127.1 kg)   LMP 05/25/2024   SpO2 99%  BMI 45.92 kg/m  Wt Readings from Last 3 Encounters:  05/28/24 280 lb 3.2 oz (127.1 kg)  06/06/23 269 lb (122 kg)  01/09/23 262 lb (118.8 kg)    Diabetic Foot Exam - Simple   No data filed    Lab Results  Component Value Date   WBC 7.2 05/27/2024   HGB 11.4 (L) 05/27/2024   HCT 34.9 (L) 05/27/2024   PLT 312.0 05/27/2024   GLUCOSE 98 05/27/2024   CHOL 123 05/27/2024   TRIG 110.0 05/27/2024   HDL 46.80 05/27/2024   LDLDIRECT 149.6 03/26/2013   LDLCALC 54 05/27/2024   ALT 16 05/27/2024   AST 16 05/27/2024   NA 139 05/27/2024   K 3.8 05/27/2024   CL 103 05/27/2024   CREATININE 0.67 05/27/2024   BUN 9 05/27/2024   CO2 28 05/27/2024   TSH 1.15 05/27/2024   INR 1.01 04/10/2011   HGBA1C 7.2 (H) 05/27/2024    Lab  Results  Component Value Date   TSH 1.15 05/27/2024   Lab Results  Component Value Date   WBC 7.2 05/27/2024   HGB 11.4 (L) 05/27/2024   HCT 34.9 (L) 05/27/2024   MCV 80.1 05/27/2024   PLT 312.0 05/27/2024   Lab Results  Component Value Date   NA 139 05/27/2024   K 3.8 05/27/2024   CO2 28 05/27/2024   GLUCOSE 98 05/27/2024   BUN 9 05/27/2024   CREATININE 0.67 05/27/2024   BILITOT 1.1 05/27/2024   ALKPHOS 58 05/27/2024   AST 16 05/27/2024   ALT 16 05/27/2024   PROT 7.6 05/27/2024   ALBUMIN 4.5 05/27/2024   CALCIUM  9.1 05/27/2024   ANIONGAP 10 11/24/2019   GFR 100.69 05/27/2024   Lab Results  Component Value Date   CHOL 123 05/27/2024   Lab Results  Component Value Date   HDL 46.80 05/27/2024   Lab Results  Component Value Date   LDLCALC 54 05/27/2024   Lab Results  Component Value Date   TRIG 110.0 05/27/2024   Lab Results  Component Value Date   CHOLHDL 3 05/27/2024   Lab Results  Component Value Date   HGBA1C 7.2 (H) 05/27/2024       Assessment & Plan:  Hyperlipidemia associated with type 2 diabetes mellitus (HCC)  Eczema, unspecified type  Acute frontal sinusitis, recurrence not specified -     Fluticasone  Propionate; Place 2 sprays into both nostrils daily as needed for allergies or rhinitis.  Dispense: 48 g; Refill: 3  Essential hypertension -     Furosemide ; Take 1 tablet (20 mg total) by mouth daily.  Dispense: 90 tablet; Refill: 1 -     amLODIPine  Besylate; Take 1 tablet (5 mg total) by mouth daily.  Dispense: 90 tablet; Refill: 1  Uncontrolled type 2 diabetes mellitus with hyperglycemia (HCC) -     metFORMIN  HCl ER; 2 po every day  Dispense: 60180 tablet; Refill: 3  Assessment and Plan Assessment & Plan Type 2 diabetes mellitus with hyperglycemia   A1c has increased to 7.2, indicating suboptimal glycemic control. Previous use of Ozempic  was effective but discontinued due to insurance issues. Current blood glucose levels are elevated,  with recent readings around 150-160 mg/dL. Discussed GLP-1 receptor agonists, including Ozempic  and Mounjaro, with Mounjaro potentially more effective for weight loss. Mounjaro lacks official cardiovascular benefits but is expected to have similar effects as Ozempic . Initiated Mounjaro at the lowest dose, with plans to titrate up monthly based on response and tolerance. Prescription sent to [facility]  on [address]. Advised to check for potential coupons online for Mounjaro.  Hyperlipidemia associated with type 2 diabetes mellitus   Cholesterol levels are well-controlled with current medication regimen.  Essential hypertension   Blood pressure management is ongoing with current medications.  Anemia   Well-managed with hemoglobin levels at 11.4 g/dL.  Eczema   Management includes topical treatment with Flonase  cream. Refilled Flonase  cream prescription.    Jamee JONELLE Antonio Cyndee, DO     [1]  Allergies Allergen Reactions   Ace Inhibitors Swelling    Swelling   Cherry    Codeine Nausea And Vomiting   Fluviral [Influenza Virus Vaccine] Other (See Comments)    Asthma exacerabation   Lisinopril  Swelling

## 2024-06-01 ENCOUNTER — Other Ambulatory Visit: Payer: Self-pay

## 2024-06-01 DIAGNOSIS — E1165 Type 2 diabetes mellitus with hyperglycemia: Secondary | ICD-10-CM

## 2024-06-01 MED ORDER — METFORMIN HCL ER 500 MG PO TB24: 1000.0000 mg | ORAL_TABLET | Freq: Every day | ORAL | 1 refills | Status: AC

## 2024-06-04 ENCOUNTER — Telehealth: Payer: Self-pay

## 2024-06-04 ENCOUNTER — Other Ambulatory Visit (HOSPITAL_COMMUNITY): Payer: Self-pay

## 2024-06-04 NOTE — Telephone Encounter (Signed)
 PA needed for Mounjaro please

## 2024-06-04 NOTE — Telephone Encounter (Signed)
 Pharmacy Patient Advocate Encounter   Received notification from Physician's Office that prior authorization for Mounjaro 2.5MG /0.5ML auto-injectors is required/requested.   Insurance verification completed.   The patient is insured through [employer].   Per test claim: PA required; PA submitted to above mentioned insurance via Latent Key/confirmation #/EOC [id number] Status is pending

## 2024-06-05 ENCOUNTER — Other Ambulatory Visit (HOSPITAL_COMMUNITY): Payer: Self-pay

## 2024-06-05 NOTE — Telephone Encounter (Signed)
 Pharmacy Patient Advocate Encounter  Received notification from [employer] that Prior Authorization for Mounjaro 2.5mg /0.59ml has been APPROVED from 05/05/24 to 06/04/25   PA #/Case ID/Reference #: [id number]
